# Patient Record
Sex: Male | Born: 1990 | Race: White | Hispanic: No | Marital: Single | Smoking: Current some day smoker
Health system: Southern US, Community
[De-identification: ages and names within clinical notes are randomized; demographics above are authoritative.]

## PROBLEM LIST (undated history)

## (undated) DIAGNOSIS — N200 Calculus of kidney: Secondary | ICD-10-CM

## (undated) DIAGNOSIS — Z87442 Personal history of urinary calculi: Secondary | ICD-10-CM

## (undated) HISTORY — PX: FOOT SURGERY: SHX648

---
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
## 2009-05-13 ENCOUNTER — Emergency Department (HOSPITAL_COMMUNITY): Admission: EM | Admit: 2009-05-13 | Discharge: 2009-05-13 | Payer: Self-pay | Admitting: Emergency Medicine

## 2009-05-24 ENCOUNTER — Emergency Department (HOSPITAL_COMMUNITY): Admission: EM | Admit: 2009-05-24 | Discharge: 2009-05-24 | Payer: Self-pay | Admitting: Emergency Medicine

## 2010-12-05 ENCOUNTER — Emergency Department (HOSPITAL_COMMUNITY)
Admission: EM | Admit: 2010-12-05 | Discharge: 2010-12-05 | Disposition: A | Discharge status: Home / Self Care | Payer: Self-pay | Attending: Emergency Medicine | Admitting: Emergency Medicine

## 2010-12-05 DIAGNOSIS — W268XXA Contact with other sharp object(s), not elsewhere classified, initial encounter: Secondary | ICD-10-CM | POA: Insufficient documentation

## 2010-12-05 DIAGNOSIS — S51809A Unspecified open wound of unspecified forearm, initial encounter: Secondary | ICD-10-CM | POA: Insufficient documentation

## 2011-01-30 ENCOUNTER — Emergency Department (HOSPITAL_COMMUNITY)
Admission: EM | Admit: 2011-01-30 | Discharge: 2011-01-30 | Disposition: A | Discharge status: Home / Self Care | Payer: Self-pay | Attending: Emergency Medicine | Admitting: Emergency Medicine

## 2011-01-30 DIAGNOSIS — R1013 Epigastric pain: Secondary | ICD-10-CM | POA: Insufficient documentation

## 2011-01-30 DIAGNOSIS — R112 Nausea with vomiting, unspecified: Secondary | ICD-10-CM | POA: Insufficient documentation

## 2012-02-06 ENCOUNTER — Encounter (HOSPITAL_COMMUNITY): Payer: Self-pay | Admitting: *Deleted

## 2012-02-06 ENCOUNTER — Emergency Department (HOSPITAL_COMMUNITY): Payer: Self-pay

## 2012-02-06 ENCOUNTER — Emergency Department (HOSPITAL_COMMUNITY)
Admission: EM | Admit: 2012-02-06 | Discharge: 2012-02-06 | Disposition: A | Discharge status: Home / Self Care | Payer: Self-pay | Attending: Emergency Medicine | Admitting: Emergency Medicine

## 2012-02-06 DIAGNOSIS — R079 Chest pain, unspecified: Secondary | ICD-10-CM | POA: Insufficient documentation

## 2012-02-06 DIAGNOSIS — R0789 Other chest pain: Secondary | ICD-10-CM

## 2012-02-06 DIAGNOSIS — F17200 Nicotine dependence, unspecified, uncomplicated: Secondary | ICD-10-CM | POA: Insufficient documentation

## 2012-02-06 MED ORDER — HYDROCODONE-ACETAMINOPHEN 5-325 MG PO TABS: 1.0000 | ORAL_TABLET | Freq: Four times a day (QID) | ORAL | Status: AC | PRN

## 2012-02-06 MED ORDER — KETOROLAC TROMETHAMINE 60 MG/2ML IM SOLN
60.0000 mg | Freq: Once | INTRAMUSCULAR | Status: AC
  Administered 2012-02-06: 60 mg via INTRAMUSCULAR
  Filled 2012-02-06: qty 2

## 2012-02-06 MED ORDER — CYCLOBENZAPRINE HCL 10 MG PO TABS: 10.0000 mg | ORAL_TABLET | Freq: Three times a day (TID) | ORAL | Status: AC | PRN

## 2012-02-06 MED ORDER — IBUPROFEN 800 MG PO TABS: 800.0000 mg | ORAL_TABLET | Freq: Three times a day (TID) | ORAL | Status: AC | PRN

## 2012-02-06 NOTE — ED Provider Notes (Signed)
History     CSN: [id number]  Arrival date & time 02/06/12  1400   First MD Initiated Contact with Patient 02/06/12 1627      Chief Complaint  Patient presents with  . Chest Pain    (Consider location/radiation/quality/duration/timing/severity/associated sxs/prior treatment) HPI Patient presents to the emergency department with left lateral rib pain that radiates the upper part of his chest patient, states it's worse with deep inspiration, movement and lying flat.  Patient, states, that he's had no recent fevers, nausea, vomiting, diarrhea, abdominal pain, back pain, headache, visual changes, weakness, calf pain, hemoptysis, rash or cough.  Patient, states he did not take anything prior to arrival, for his discomfort.  Patient denies any thing that seems to make the pain better.  History reviewed. No pertinent past medical history.  History reviewed. No pertinent past surgical history.  History reviewed. No pertinent family history.  History  Substance Use Topics  . Smoking status: Current Everyday Smoker    Types: Cigarettes  . Smokeless tobacco: Not on file  . Alcohol Use: No      Review of Systems All other systems negative except as documented in the HPI. All pertinent positives and negatives as reviewed in the HPI.  Allergies  Review of patient's allergies indicates no known allergies.  Home Medications  No current outpatient prescriptions on file.  BP 118/65  Pulse 68  Temp 98.4 F (36.9 C) (Oral)  Resp 16  Ht 5' 8.5" (1.74 m)  Wt 145 lb (65.772 kg)  BMI 21.73 kg/m2  SpO2 99%  Physical Exam  Nursing note and vitals reviewed. Constitutional: He appears well-developed and well-nourished. No distress.  Cardiovascular: Normal rate, regular rhythm and normal heart sounds.  Exam reveals no gallop and no friction rub.   No murmur heard. Pulmonary/Chest: Effort normal and breath sounds normal. No respiratory distress. He has no wheezes. He has no rales. He  exhibits no crepitus.      ED Course  Procedures (including critical care time)  Labs Reviewed - No data to display Dg Chest 2 View  02/06/2012  *RADIOLOGY REPORT*  Clinical Data: Smoker with chest pain and nausea.  CHEST - 2 VIEW  Comparison: None.  Findings: Cardiomediastinal silhouette unremarkable.  Lungs clear. Bronchovascular markings normal.  Pulmonary vascularity normal.  No pleural effusions.  No pneumothorax.  Visualized bony thorax intact.  IMPRESSION: Normal examination.   Original Report Authenticated By: Arnell Sieving, M.D.     The patient most likely has chest wall pain based on his HPI and PE. The patient is PERC negative and wells Criteria is low risk. The patient has palpable pain in his chest wall.   MDM   Date: 02/06/2012  Rate: 64  Rhythm: normal sinus rhythm  QRS Axis: normal  Intervals: normal  ST/T Wave abnormalities: normal  Conduction Disutrbances:none  Narrative Interpretation:   Old EKG Reviewed: none available          Carlyle Dolly, PA-C 02/06/12 1757

## 2012-02-06 NOTE — Discharge Instructions (Signed)
Return here as needed. Use ice and heat on your chest °

## 2012-02-06 NOTE — ED Notes (Signed)
Patient reports that he is having left rib cage area pain and chest pain. Patient reports that he has been having heart racing and nausea at times. Patient also states it is worse when he lays down.

## 2012-02-06 NOTE — ED Notes (Signed)
Pt back from X-ray.  

## 2012-02-06 NOTE — ED Provider Notes (Signed)
Medical screening examination/treatment/procedure(s) were performed by non-physician practitioner and as supervising physician I was immediately available for consultation/collaboration.   Celene Kras, MD 02/06/12 [phone]

## 2012-02-06 NOTE — ED Notes (Signed)
Patient transported to X-ray 

## 2012-09-21 ENCOUNTER — Encounter (HOSPITAL_COMMUNITY): Payer: Self-pay | Admitting: *Deleted

## 2012-09-21 ENCOUNTER — Emergency Department (HOSPITAL_COMMUNITY)
Admission: EM | Admit: 2012-09-21 | Discharge: 2012-09-21 | Disposition: A | Discharge status: Home / Self Care | Payer: Self-pay | Attending: Emergency Medicine | Admitting: Emergency Medicine

## 2012-09-21 ENCOUNTER — Emergency Department (HOSPITAL_COMMUNITY): Payer: Self-pay

## 2012-09-21 DIAGNOSIS — S6391XA Sprain of unspecified part of right wrist and hand, initial encounter: Secondary | ICD-10-CM

## 2012-09-21 DIAGNOSIS — F17200 Nicotine dependence, unspecified, uncomplicated: Secondary | ICD-10-CM | POA: Insufficient documentation

## 2012-09-21 DIAGNOSIS — Y9239 Other specified sports and athletic area as the place of occurrence of the external cause: Secondary | ICD-10-CM | POA: Insufficient documentation

## 2012-09-21 DIAGNOSIS — Y92838 Other recreation area as the place of occurrence of the external cause: Secondary | ICD-10-CM | POA: Insufficient documentation

## 2012-09-21 DIAGNOSIS — R296 Repeated falls: Secondary | ICD-10-CM | POA: Insufficient documentation

## 2012-09-21 DIAGNOSIS — S6390XA Sprain of unspecified part of unspecified wrist and hand, initial encounter: Secondary | ICD-10-CM | POA: Insufficient documentation

## 2012-09-21 DIAGNOSIS — Y9367 Activity, basketball: Secondary | ICD-10-CM | POA: Insufficient documentation

## 2012-09-21 MED ORDER — NAPROXEN 500 MG PO TABS: 500.0000 mg | ORAL_TABLET | Freq: Two times a day (BID) | ORAL | Status: DC

## 2012-09-21 NOTE — ED Provider Notes (Signed)
Medical screening examination/treatment/procedure(s) were performed by non-physician practitioner and as supervising physician I was immediately available for consultation/collaboration.   Gavin Pound. Oletta Lamas, MD 09/21/12 1610

## 2012-09-21 NOTE — ED Provider Notes (Signed)
History    This chart was scribed for non-physician practitioner working with Gavin Pound. Ghim, MD by ED Scribe, Burman Nieves. This patient was seen in room WTR6/WTR6 and the patient's care was started at 7:10 PM.   CSN: [id number]  Arrival date & time 09/21/12  1605   First MD Initiated Contact with Patient 09/21/12 1910      Chief Complaint  Patient presents with  . Hand Injury    (Consider location/radiation/quality/duration/timing/severity/associated sxs/prior treatment) The history is provided by the patient. No language interpreter was used.   Aaron Fry is a 22 y.o. male who presents to the Emergency Department complaining of moderate constant right hand pain onset 2 weeks ago. Pt states he was playing basketball when he injured his right hand by landing on it. Pt states he has been putting ice on the affected area and then a heating compress with no immediate relief. He states movement exacerbates pain in the palm of his right hand. Pt denies fever, chills, cough, nausea, vomiting, diarrhea, SOB, weakness, and any other associated symptoms.   History reviewed. No pertinent past medical history.  History reviewed. No pertinent past surgical history.  History reviewed. No pertinent family history.  History  Substance Use Topics  . Smoking status: Current Every Day Smoker    Types: Cigarettes  . Smokeless tobacco: Never Used  . Alcohol Use: No      Review of Systems  Musculoskeletal: Positive for myalgias and arthralgias.  All other systems reviewed and are negative.    Allergies  Review of patient's allergies indicates no known allergies.  Home Medications  No current outpatient prescriptions on file.  BP 108/55  Pulse 60  Temp(Src) 97.9 F (36.6 C) (Oral)  Resp 18  SpO2 100%  Physical Exam  Nursing note and vitals reviewed. Constitutional: He is oriented to person, place, and time. He appears well-developed and well-nourished. No distress.  HENT:   Head: Normocephalic and atraumatic.  Eyes: EOM are normal.  Neck: Neck supple. No tracheal deviation present.  Cardiovascular: Normal rate.   Pulmonary/Chest: Effort normal. No respiratory distress.  Musculoskeletal: Normal range of motion. He exhibits tenderness.  Full ROM to the wrist no pain. Tender to his 3rd and 4th metacarpals. Full ROM of all fingers pain with ROM of the 3rd and 4th finger MCP joint. Hand appears normal no swelling or bruising. Normal cap refill of all distal fingers  Neurological: He is alert and oriented to person, place, and time.  Skin: Skin is warm and dry.  Psychiatric: He has a normal mood and affect. His behavior is normal.    ED Course  Procedures (including critical care time) DIAGNOSTIC STUDIES: Oxygen Saturation is 100% on room air, normal by my interpretation.    COORDINATION OF CARE: 7:23 PM Discussed ED treatment with pt and pt agrees.      Labs Reviewed - No data to display Dg Hand Complete Right  09/21/2012  *RADIOLOGY REPORT*  Clinical Data: Pain secondary to an injury while playing basketball 2 weeks ago.  RIGHT HAND - COMPLETE 3+ VIEW  Comparison: None.  Findings: There is no fracture, dislocation, or other abnormality.  IMPRESSION: Normal exam.   Original Report Authenticated By: Francene Boyers, M.D.      1. Hand sprain, right, initial encounter       MDM  X-ray negative. No obvious swelling or bruising noted on the hand. neurovascularly intact. Full rom of all fingers. Ace wrap at home, ice, elevation, naprosyn. Follow  up with hand if not improving.  Suspect a sprain.    I personally performed the services described in this documentation, which was scribed in my presence. The recorded information has been reviewed and is accurate.         Lottie Mussel, PA-C 09/21/12 1941

## 2012-09-21 NOTE — ED Notes (Signed)
Pt states he was playing basketball x 2 weeks ago, states "I hurt my right hand", pt states he's unsure of what he did but hurts at palm of R hand, no deformity or swelling noted, pt able to move finger and hand freely.

## 2014-06-11 ENCOUNTER — Encounter (HOSPITAL_COMMUNITY): Payer: Self-pay | Admitting: Emergency Medicine

## 2014-06-11 ENCOUNTER — Emergency Department (HOSPITAL_COMMUNITY): Payer: Self-pay

## 2014-06-11 ENCOUNTER — Emergency Department (HOSPITAL_COMMUNITY)
Admission: EM | Admit: 2014-06-11 | Discharge: 2014-06-11 | Disposition: A | Discharge status: Home / Self Care | Payer: Self-pay | Attending: Emergency Medicine | Admitting: Emergency Medicine

## 2014-06-11 DIAGNOSIS — R0781 Pleurodynia: Secondary | ICD-10-CM | POA: Insufficient documentation

## 2014-06-11 DIAGNOSIS — Z720 Tobacco use: Secondary | ICD-10-CM | POA: Insufficient documentation

## 2014-06-11 DIAGNOSIS — Z791 Long term (current) use of non-steroidal anti-inflammatories (NSAID): Secondary | ICD-10-CM | POA: Insufficient documentation

## 2014-06-11 DIAGNOSIS — H81399 Other peripheral vertigo, unspecified ear: Secondary | ICD-10-CM | POA: Insufficient documentation

## 2014-06-11 LAB — I-STAT CHEM 8, ED
BUN: 11 mg/dL (ref 6–23)
CALCIUM ION: 1.16 mmol/L (ref 1.12–1.23)
CREATININE: 0.8 mg/dL (ref 0.50–1.35)
Chloride: 96 mEq/L (ref 96–112)
GLUCOSE: 105 mg/dL — AB (ref 70–99)
HCT: 49 % (ref 39.0–52.0)
HEMOGLOBIN: 16.7 g/dL (ref 13.0–17.0)
Potassium: 3.8 mmol/L (ref 3.5–5.1)
Sodium: 136 mmol/L (ref 135–145)
TCO2: 25 mmol/L (ref 0–100)

## 2014-06-11 MED ORDER — SODIUM CHLORIDE 0.9 % IV BOLUS (SEPSIS)
1000.0000 mL | Freq: Once | INTRAVENOUS | Status: AC
  Administered 2014-06-11: 1000 mL via INTRAVENOUS

## 2014-06-11 MED ORDER — MECLIZINE HCL 50 MG PO TABS: 50.0000 mg | ORAL_TABLET | Freq: Three times a day (TID) | ORAL | Status: DC | PRN

## 2014-06-11 MED ORDER — NAPROXEN 500 MG PO TABS: 500.0000 mg | ORAL_TABLET | Freq: Two times a day (BID) | ORAL | Status: DC

## 2014-06-11 NOTE — ED Notes (Signed)
Pt from home c/o right shoulder pain sharp in character and dizziness x a few days. He reports the dizziness feels as if the room is spinning. No changes in vision.

## 2014-06-11 NOTE — ED Notes (Signed)
Patient transported to X-ray 

## 2014-06-11 NOTE — ED Provider Notes (Signed)
CSN: [id number]     Arrival date & time 06/11/14  1246 History   First MD Initiated Contact with Patient 06/11/14 1534     Chief Complaint  Patient presents with  . Shoulder Pain     (Consider location/radiation/quality/duration/timing/severity/associated sxs/prior Treatment) HPI   24 year old male with no significant past medical history who presents complaining of right shoulder blade pain and dizziness. Patient states for the past 2-3 days he has had persistent pain to his right shoulder blade worsening with taking deep breath or with shoulder movement. Pain is described as a sharp sensation that is not improved despite taking ibuprofen as treatment. He denies any specific injury. He denies any numbness or weakness. Furthermore patient states that he has had episodes of dizziness which she described as a room spinning sensation worsening with positional changes especially home from a sitting to a standing position. This happened on occasion. No planes of dizziness while laying at rest. He denies having any fever, chills, hearing changes, ear pain, URI symptoms, recent alcohol or street drug use, although very dehydrated. Denies any change in vision, no blurred vision or double vision. He does not have a primary care doctor and is not allergic to any medication. He denies any prior history of PUD DVT, no recent surgery, prolonged bed rest, unilateral leg swelling or calf pain.  History reviewed. No pertinent past medical history. History reviewed. No pertinent past surgical history. No family history on file. History  Substance Use Topics  . Smoking status: Current Every Day Smoker    Types: Cigarettes  . Smokeless tobacco: Never Used  . Alcohol Use: Yes     Comment: rarely    Review of Systems  All other systems reviewed and are negative.     Allergies  Review of patient's allergies indicates no known allergies.  Home Medications   Prior to Admission medications   Medication  Sig Start Date End Date Taking? Authorizing Provider  naproxen (NAPROSYN) 500 MG tablet Take 1 tablet (500 mg total) by mouth 2 (two) times daily. Patient not taking: Reported on 06/11/2014 09/21/12   Tatyana A Kirichenko, PA-C   BP 108/64 mmHg  Pulse 94  Temp(Src) 97.8 F (36.6 C) (Oral)  Resp 18  SpO2 100% Physical Exam  Constitutional: He is oriented to person, place, and time. He appears well-developed and well-nourished. No distress.  HENT:  Head: Atraumatic.  Right Ear: External ear normal.  Left Ear: External ear normal.  Eyes: Conjunctivae and EOM are normal. Pupils are equal, round, and reactive to light.  Neck: Normal range of motion. Neck supple.  No nuchal rigidity  Cardiovascular: Normal rate and regular rhythm.   Pulmonary/Chest: Effort normal and breath sounds normal. He exhibits tenderness (Tenderness along the inferior right scapular lesion on palpation without any overlying skin changes. Full range of motion throughout right shoulder.).  Abdominal: Bowel sounds are normal. He exhibits no distension. There is no tenderness.  No abdominal mass or bruit noted  Musculoskeletal:  Radial pulses palpable bilaterally, normal grip strength.  Neurological: He is alert and oriented to person, place, and time.  Skin: No rash noted.  Psychiatric: He has a normal mood and affect.    ED Course  Procedures (including critical care time)  Patient presents complaining of pain to his right shoulder blade without any significant trauma. He also complaining of some pleuritic component to it. He is PERC negative, therefore low suspicion for PE. He also complaining of sensation of dizziness upon standing, likely  peripheral vertigo and less likely to be central. No complaint of dizziness at this time. No abdominal pain, or bruits to suggest AAA. He is afebrile, vital signs stable and no hypoxia. Will check H&H, and will also obtain a chest x-ray for further evaluation. I have low suspicion  for cardiopulmonary etiology.  4:55 PM Patient has normal orthostatic vital sign, a chest x-ray shows no evidence of lung infection or any bony pathology, and he has normal H&H without any electrolytes abnormalities. EKG shows no evidence of arrhythmia.  Patient currently without any ongoing symptoms. Plan to discharge patient with meclizine for his peripheral vertigo, recommend ibuprofen for pain, and patient may return if his condition worsen. Patient voiced understanding and agrees with plan. Provide resources for outpatient follow-up as well.  Labs Review Labs Reviewed  I-STAT CHEM 8, ED - Abnormal; Notable for the following:    Glucose, Bld 105 (*)    All other components within normal limits    Imaging Review Dg Chest 2 View  06/11/2014   CLINICAL DATA:  Sharp right-sided chest pain for 3-4 days.  EXAM: CHEST  2 VIEW  COMPARISON:  02/06/2012  FINDINGS: The heart size and mediastinal contours are within normal limits. Both lungs are clear. The visualized skeletal structures are unremarkable.  IMPRESSION: Normal chest.   Electronically Signed   By: Geanie Cooley M.D.   On: 06/11/2014 16:34     EKG Interpretation None      Date: 06/11/2014  Rate: 81  Rhythm: normal sinus rhythm  QRS Axis: normal  Intervals: normal  ST/T Wave abnormalities: normal  Conduction Disutrbances: none  Narrative Interpretation:   Old EKG Reviewed: none for comparison  MDM   Final diagnoses:  Pleuritic chest pain  Vertigo, peripheral, unspecified laterality    BP 119/50 mmHg  Pulse 81  Temp(Src) 97.8 F (36.6 C) (Oral)  Resp 20  SpO2 98%  I have reviewed nursing notes and vital signs. I personally reviewed the imaging tests through PACS system  I reviewed available ER/hospitalization records thought the EMR     Fayrene Helper, PA-C 06/11/14 1710  Arby Barrette, MD 06/12/14 [phone]

## 2014-06-11 NOTE — Discharge Instructions (Signed)
Benign Positional Vertigo Vertigo means you feel like you or your surroundings are moving when they are not. Benign positional vertigo is the most common form of vertigo. Benign means that the cause of your condition is not serious. Benign positional vertigo is more common in older adults. CAUSES  Benign positional vertigo is the result of an upset in the labyrinth system. This is an area in the middle ear that helps control your balance. This may be caused by a viral infection, head injury, or repetitive motion. However, often no specific cause is found. SYMPTOMS  Symptoms of benign positional vertigo occur when you move your head or eyes in different directions. Some of the symptoms may include:  Loss of balance and falls.  Vomiting.  Blurred vision.  Dizziness.  Nausea.  Involuntary eye movements (nystagmus). DIAGNOSIS  Benign positional vertigo is usually diagnosed by physical exam. If the specific cause of your benign positional vertigo is unknown, your caregiver may perform imaging tests, such as magnetic resonance imaging (MRI) or computed tomography (CT). TREATMENT  Your caregiver may recommend movements or procedures to correct the benign positional vertigo. Medicines such as meclizine, benzodiazepines, and medicines for nausea may be used to treat your symptoms. In rare cases, if your symptoms are caused by certain conditions that affect the inner ear, you may need surgery. HOME CARE INSTRUCTIONS   Follow your caregiver's instructions.  Move slowly. Do not make sudden body or head movements.  Avoid driving.  Avoid operating heavy machinery.  Avoid performing any tasks that would be dangerous to you or others during a vertigo episode.  Drink enough fluids to keep your urine clear or pale yellow. SEEK IMMEDIATE MEDICAL CARE IF:   You develop problems with walking, weakness, numbness, or using your arms, hands, or legs.  You have difficulty speaking.  You develop  severe headaches.  Your nausea or vomiting continues or gets worse.  You develop visual changes.  Your family or friends notice any behavioral changes.  Your condition gets worse.  You have a fever.  You develop a stiff neck or sensitivity to light. MAKE SURE YOU:   Understand these instructions.  Will watch your condition.  Will get help right away if you are not doing well or get worse. Document Released: 03/04/2006 Document Revised: 08/19/2011 Document Reviewed: 02/14/2011 [facility] Patient Information 2015 [place], [place]. This information is not intended to replace advice given to you by your health care provider. Make sure you discuss any questions you have with your health care provider.   Emergency Department Resource Guide 1) Find a Doctor and Pay Out of Pocket Although you won't have to find out who is covered by your insurance plan, it is a good idea to ask around and get recommendations. You will then need to call the office and see if the doctor you have chosen will accept you as a new patient and what types of options they offer for patients who are self-pay. Some doctors offer discounts or will set up payment plans for their patients who do not have insurance, but you will need to ask so you aren't surprised when you get to your appointment.  2) Contact Your Local Health Department Not all health departments have doctors that can see patients for sick visits, but many do, so it is worth a call to see if yours does. If you don't know where your local health department is, you can check in your phone book. The CDC also has a tool to  help you locate your state's health department, and many state websites also have listings of all of their local health departments.  3) Find a [facility] If your illness is not likely to be very severe or complicated, you may want to try a walk in clinic. These are popping up all over the country in pharmacies, drugstores, and shopping  centers. They're usually staffed by nurse practitioners or physician assistants that have been trained to treat common illnesses and complaints. They're usually fairly quick and inexpensive. However, if you have serious medical issues or chronic medical problems, these are probably not your best option.  No Primary Care Doctor: - Call Health Connect at  [phone] - they can help you locate a primary care doctor that  accepts your insurance, provides certain services, etc. - Physician Referral Service- [phone]  Chronic Pain Problems: Organization         Address  Phone   Notes  [facility]  [phone] Patients need to be referred by their primary care doctor.   Medication Assistance: Organization         Address  Phone   Notes  [facility] Medication [facility] [address] [place], [place] [address] [phone] --Must be a resident of [facility] -- Must have NO insurance coverage whatsoever (no Medicaid/ Medicare, etc.) -- The pt. MUST have a primary care doctor that directs their care regularly and follows them in the community   MedAssist  [phone]   [employer]  [phone]    Agencies that provide inexpensive medical care: Organization         Address  Phone   Notes  [place]  [phone]   [facility]    [phone]   [facility] [place], [place] [address] [phone]   [place] [address], [place] [phone]   Planned Parenthood    [phone]   [facility]    [phone]   [place] and [place] [address], [place] Phone:  [phone], Fax:  [phone] Hours of Operation:  9 am - 6 pm, M-F.  Also accepts Medicaid/Medicare and self-pay.  [facility] for [employer], [address], [place] Phone: [phone], Fax: [phone]. Hours of Operation:  8:30 am - 5:30 pm, M-F.  Also accepts Medicaid and self-pay.  [facility] [place] [address] [place] Phone: [phone]   [place], [place], [place] [phone], Ext. [phone] Mondays & Thursdays: 7-9 AM.  First 15 patients are seen on a first come, first serve basis.    [place] Providers:  Organization         Address  Phone   Notes  [facility] [address], [place] [phone] Also accepts self-pay patients.  [facility] [address], [place]  [phone]   [place], [address], [place] [phone]   [facility] [address] [place] [phone]   Lucianne Lei [address], [place]   [phone] Only accepts [place] Access [place] patients after they have their name applied to their card.   Self-Pay (no insurance) in [facility]:  Organization  Address  Phone   Notes  Sickle Cell Patients, [facility] [address] [place], [place] [phone]   [facility] [address] [place], [place] [phone]   [facility] [place]  [address], [place] [phone]   [facility]/Dr. Osei-Bonsu  [address] [place] or [address], [place] [phone] Phone number for both [place] and [place] locations is the same.  Urgent Medical and [facility] [address] [place] [phone]   [facility] [address] [place] or [address] [phone]   [facility] [address] [place] [phone], phone; [phone], fax Sees patients 1st and 3rd Saturday of every month.  Must not qualify for public or private insurance (i.e.  Medicaid, Medicare, [place] Health Choice, Veterans' Benefits)  Household income should be no more than 200% of the poverty level The clinic cannot treat you if you are pregnant or think you are pregnant  Sexually transmitted diseases are not treated at the clinic.    Dental Care: Organization         Address  Phone  Notes  [facility] Department of [facility] [address] [place], [place] [phone] Accepts children up to age 65 who are enrolled in [place] or [place] Health Choice; pregnant women with a Medicaid card; and children who have applied for Medicaid or [place] Health Choice, but were declined, whose parents can pay a reduced fee at time of service.  [facility] Department of [facility]  [address], [place] [phone] Accepts children up to age 62 who are enrolled in [place] or [place] Health Choice; pregnant women with a Medicaid card; and children who have applied for Medicaid or [place] Health Choice, but were declined, whose parents can pay a reduced fee at time of service.  [facility]  [address] [place], [place] [phone] Patients are seen by appointment only. Walk-ins are not accepted. [facility] will see patients 70 years of age and older. Monday - Tuesday (8am-5pm) Most Wednesdays (8:30-5pm) $30 per visit, cash only  [facility] Adult [facility]  [address], [facility] [phone] Patients are seen by appointment only. Walk-ins are not accepted. [facility] will see patients 72 years of age and older. One Wednesday Evening (Monthly: Volunteer Based).  $30 per visit, cash only  [employer]  [phone] for adults; Children under age 4, call Graduate Pediatric Dentistry at [phone]. Children aged 28-14, please call [phone] to request a pediatric application.  Dental services are provided in all areas of dental care including fillings,  crowns and bridges, complete and partial dentures, implants, gum treatment, root canals, and extractions. Preventive care is also provided. Treatment is provided to both adults and children. Patients are selected via a lottery and there is often a waiting list.   [facility] [address] [place]  [phone] [email or web address]   [facility] [address] [place], [place] [phone], Ext. [phone] Second and Fourth Thursday of each month, opens at 6:30 AM; Clinic ends at 9 AM.  Patients are seen on a first-come first-served basis, and a limited number are seen during each clinic.   [facility]  [address] [place], [place]  [place], [place] [phone]   Eligibility Requirements You must have lived in [place], [place], or [place] counties for at least the last three months.   You cannot be eligible for state or federal sponsored [employer], including [employer], [place], or [employer].   You generally cannot be eligible for healthcare insurance through your employer.    How to apply: Eligibility screenings are held every Tuesday and Wednesday afternoon from 1:00 pm until 4:00 pm. You do not need an appointment for the interview!  [facility] [address], [place], [place]   [place]  [place] Department  [place]  [phone]    Behavioral Health Resources in the Community: Intensive Outpatient Programs Organization         Address  Phone  Notes  [facility]. [address] [place], [place] [phone]   [facility] Outpatient [address] [place], [place]   ADS: [facility] [address] [place], [place]   [employer] [address],  [place], [place] or [phone]   Substance Abuse  Resources Organization         Address  Phone  Notes  [facility] Drug Services  [phone]   [place]  [phone]   [facility] [place]   Chinita Pester  [phone]   Residential & Outpatient Substance Abuse Program  [phone]   Psychological Services Organization         Address  Phone  Notes  [facility] [place]  [place]  [phone]   [place] [address], [place] or [phone]    Mobile Crisis Teams Organization         Address  Phone  Notes  [facility], Mobile Crisis Care Unit  [phone]   [facility]  [address] [place], [place]   [place] [address], [place] [phone]    Self-Help/Support Groups Organization         Address  Phone             Notes  [place]. of [place] - variety of support groups  [place] Call for more information  Narcotics Anonymous (NA), [facility] [address], [employer] [place]  2 meetings at this location   Special educational needs teacher         Address  Phone  Notes  [facility] [place],    [place]  [phone]   [facility]  [address] [place] [address], [place], [place]   [place], [place] [phone] Admissions: 8am-3pm M-F  Incentives Substance [place] [address]    [place], [place] [phone]   [facility] [address] Jadene Pierini [place], [place]   [facility] [address],  [place], [place]   [facility] - Intensive Outpatient [address] Kristeen [address], [place], [place]   [facility] ([place].) [place].,  [place], [place] or [phone]   [facility] ([facility]) [address], [place], [place] Accepts Medicaid  [facility] [place] [address]  [place] [phone] Substance Abuse/Addiction Treatment   [facility] Resources Organization  Address  Phone  Notes  °[facility]  ([phone]) [phone]   °Julie Brannon, PhD [address] [place], [place]   ([phone] or ([phone]   °[facility]   [address] °[place], [place] [phone]   °[facility] [address], [place], [place] [phone] Insurance/Medicaid/sponsorship through [facility]  °[facility] [address]                                    [place], [place] [phone] Therapy/tele-psych/case  °[facility] [address]  ° [place], [place] [phone]    °Dr. Arfeen  ([phone]   °[facility] [place]  [employer] [place] [facility] 1) [address], [place] °2) [address], [place] °3)  [address], [place] [phone]) [phone] °([phone]) [phone] ° °([phone]) [phone]   °[place] Child Abuse Hotline (336) 342-1394 or (336) [phone] (After Hours)    ° ° ° °

## 2014-10-08 ENCOUNTER — Emergency Department (HOSPITAL_COMMUNITY)
Admission: EM | Admit: 2014-10-08 | Discharge: 2014-10-08 | Disposition: A | Discharge status: Home / Self Care | Payer: Self-pay | Attending: Emergency Medicine | Admitting: Emergency Medicine

## 2014-10-08 ENCOUNTER — Encounter (HOSPITAL_COMMUNITY): Payer: Self-pay | Admitting: Emergency Medicine

## 2014-10-08 DIAGNOSIS — Z791 Long term (current) use of non-steroidal anti-inflammatories (NSAID): Secondary | ICD-10-CM | POA: Insufficient documentation

## 2014-10-08 DIAGNOSIS — Z720 Tobacco use: Secondary | ICD-10-CM | POA: Insufficient documentation

## 2014-10-08 DIAGNOSIS — M545 Low back pain, unspecified: Secondary | ICD-10-CM

## 2014-10-08 MED ORDER — METHOCARBAMOL 500 MG PO TABS: 500.0000 mg | ORAL_TABLET | Freq: Two times a day (BID) | ORAL | Status: DC

## 2014-10-08 MED ORDER — NAPROXEN 500 MG PO TABS: 500.0000 mg | ORAL_TABLET | Freq: Two times a day (BID) | ORAL | Status: DC

## 2014-10-08 NOTE — Discharge Instructions (Signed)
Take naproxen and muscle relaxant as needed for your pain.  Follow up with a primary care provider for further care   Emergency Department Resource Guide 1) Find a Doctor and Pay Out of Pocket Although you won't have to find out who is covered by your insurance plan, it is a good idea to ask around and get recommendations. You will then need to call the office and see if the doctor you have chosen will accept you as a new patient and what types of options they offer for patients who are self-pay. Some doctors offer discounts or will set up payment plans for their patients who do not have insurance, but you will need to ask so you aren't surprised when you get to your appointment.  2) Contact Your Local Health Department Not all health departments have doctors that can see patients for sick visits, but many do, so it is worth a call to see if yours does. If you don't know where your local health department is, you can check in your phone book. The CDC also has a tool to help you locate your state's health department, and many state websites also have listings of all of their local health departments.  3) Find a Walk-in Clinic If your illness is not likely to be very severe or complicated, you may want to try a walk in clinic. These are popping up all over the country in pharmacies, drugstores, and shopping centers. They're usually staffed by nurse practitioners or physician assistants that have been trained to treat common illnesses and complaints. They're usually fairly quick and inexpensive. However, if you have serious medical issues or chronic medical problems, these are probably not your best option.  No Primary Care Doctor: - Call Health Connect at  [phone] - they can help you locate a primary care doctor that  accepts your insurance, provides certain services, etc. - Physician Referral Service- [phone]  Chronic Pain Problems: Organization         Address  Phone   Notes  [facility]  [phone] Patients need to be referred by their primary care doctor.   Medication Assistance: Organization         Address  Phone   Notes  [facility] [address] [place], [place] [address] [phone]) [phone] --Must be a resident of [facility] -- Must have NO insurance coverage whatsoever (no Medicaid/ Medicare, etc.) -- The pt. MUST have a primary care doctor that directs their care regularly and follows them in the community   MedAssist  [phone]   [employer]  [phone]    Agencies that provide inexpensive medical care: Organization         Address  Phone   Notes  [facility]  [phone]   [facility]    [phone]   [facility] [address] [place], [place] [address] [phone]   [facility] [address], [place] [phone]   Planned Parenthood    [phone](336) [phone]   [facility]    [phone]   [facility]  [address], [place] Phone:  [phone], Fax:  [phone]) [phone] Hours of Operation:  9 am - 6 pm, M-F.  Also accepts Medicaid/Medicare and self-pay.  [facility]  [address], [place] Phone: [phone](336) [phone], Fax: [phone]. Hours of Operation:  8:30 am - 5:30 pm, M-F.  Also accepts Medicaid and self-pay.  [facility] [place] [address], [place] Phone: [phone]   [facility] [address] Natasha Bence [place], [place] [phone], Ext. [phone] Mondays & Thursdays: 7-9 AM.  First 15 patients are seen on a first come, first serve basis.    Medicaid-accepting [facility] Providers:  Organization         Address  Phone   Notes  [facility] [address], [place] [phone] Also accepts self-pay patients.  [facility] [address]  Laurell Josephs [place], [place]  [phone]   [facility] [address], [place] [phone]   [facility] [address], [place] [phone]   Renaye Rakers [address], [place]   [phone] Only accepts [place] Access [place] patients after they have their name applied to their card.   Self-Pay (no insurance) in [facility]:  Organization         Address  Phone   Notes  Sickle Cell Patients, [facility] [address] [place], [place] [phone]   [facility] [address] [place], [place] [phone]   [facility] [place]  [address], [place] [phone]   [facility]/Dr. Osei-Bonsu  [address] [place] or [address], [place] [phone] Phone number for both [place] and [place] locations is the same.  [facility] and [facility] [address] [place] [phone]   [facility] [address], [place] or [address] [phone]   [facility] [address], [place] [phone], phone; [phone], fax Sees patients 1st and 3rd Saturday of every month.  Must not qualify for public or private insurance (i.e. Medicaid, Medicare, [place] Health Choice, Veterans' Benefits)  Household income should be no more than 200% of the poverty level The clinic cannot treat you if you are pregnant or think you are pregnant  Sexually transmitted diseases are not treated at the clinic.    Dental Care: Organization         Address  Phone  Notes  [facility] [address] [place], [place] [phone] Accepts children up to age 2 who are enrolled in [place] or [place] Health Choice; pregnant women with a Medicaid card; and children who have applied for Medicaid  or [place] Health Choice, but were declined, whose parents can pay a reduced fee at time of service.  [facility] of [facility]  [address], [place] [phone] Accepts children up to age 65 who are enrolled in [place] or [place] Health Choice; pregnant women with a Medicaid card; and children who have applied for Medicaid or [place] Health Choice, but were declined, whose parents can pay a reduced fee at time of service.  [facility]  [address] [place], [place] [phone] Patients are seen by appointment only. Walk-ins are not accepted. [facility] will see patients 44 years of age and older. Monday - Tuesday (8am-5pm) Most Wednesdays (8:30-5pm) $30 per visit, cash only  [facility] [employer] [facility]  [address], [employer] [phone])  [phone] Patients are seen by appointment only. Walk-ins are not accepted. [place] will see patients 22 years of age and older. One Wednesday Evening (Monthly: Volunteer Based).  $30 per visit, cash only  [place]  [phone] for adults; Children under age 98, call Graduate Pediatric Dentistry at [phone]. Children aged 22-14, please call [phone] to request a pediatric application.  Dental services are provided in all areas of dental care including fillings, crowns and bridges, complete and partial dentures, implants, gum treatment, root canals, and extractions. Preventive care is also provided. Treatment is provided to both adults and children. Patients are selected via a lottery and there is often a waiting list.   [facility] [address] [place]  [phone] [email or web address]   [facility] [address] [place], [place] [phone], Ext. [phone] Second and Fourth Thursday of each month, opens at 6:30 AM; Clinic ends at 9 AM.  Patients are seen on a first-come first-served basis, and a limited number are seen  during each clinic.   [facility]  [address] Hillard Danker [place], [place] [phone]   Eligibility Requirements You must have lived in [place], [place], or [place] counties for at least the last three months.   You cannot be eligible for state or federal sponsored [employer], including [employer], [place], or [employer].   You generally cannot be eligible for healthcare insurance through your employer.    How to apply: Eligibility screenings are held every Tuesday and Wednesday afternoon from 1:00 pm until 4:00 pm. You do not need an appointment for the interview!  [facility] [address], [place], [place]   [place]  [place] Department  [place]  [phone]    Behavioral Health Resources in the Community: Intensive Outpatient Programs Organization         Address  Phone  Notes  [place] [address], [place], [place] [phone]   [facility]patient [address] [place], [place]   ADS: Alcohol & Drug Svcs [address], [place], [place]   [place] [address],  [place], [place] or [phone]   Substance Abuse Resources Organization         Address  Phone  Notes  Alcohol and Drug Services  [phone]   [place]  [phone]   The [place]   Chinita Pester  [phone]   Residential & Outpatient Substance Abuse Program  [phone]   Psychological Services Organization         Address  Phone  Notes  [facility] [place]  [place]  [phone]   [place] [address], [place] or [phone]    Mobile Crisis Teams Organization         Address  Phone  Notes  [facility],  Mobile Crisis Care Unit  [phone]   [facility]  [address] [place], [place]   [place] Levels [address], [place] [phone]    Self-Help/Support Groups Organization         Address  Phone             Notes  [place]. of [place] - variety of support groups  [phone] Call for more information  Narcotics Anonymous (NA), Caring Services [address], [employer] [place]  2 meetings at this location   Residential Facilities manager         Address  Phone  Notes  [facility] [place],    [place]  [phone]   [facility]  [address] [place] 127517, [place], [place]   [place], [place] [phone] Admissions: 8am-3pm M-F  Incentives Substance [place] [address]    [place], [place] [phone]   [facility] [address] [place], [place], [place]   [facility] [address],  [place], [place]   [facility]ight Programs - Intensive Outpatient [address], [place], [place]   [facility] ([place] [address]  [place], [place] [phone] or [phone]   [facility] Services (RTS) [address], [place], [place] Accepts Medicaid  [facility] [place] [address]  [place] [phone] Substance Abuse/Addiction Treatment   [facility] Organization         Address  Phone  Notes  [facility]  [phone]   Domenic Schwab, PhD [address] Arlis Porta [place], [place]   [phone] or [phone]   [facility] [place], [place] [phone]   [facility] [address], [place], [place] [phone] Insurance/Medicaid/sponsorship through [facility] and Families [address],  [address] [place]                                    [place], [place] [phone] [place] [address], [place] [phone]    Dr. Adele Schilder  [phone]   [facility]. 1) [address], [place] 2) [place] 3)  [place] [address], [place] [phone]  [phone]   [place] [phone] or [phone] (After Hours)

## 2014-10-08 NOTE — ED Notes (Addendum)
Verbalized understanding discharge instructions. In no acute distress.  Pt provided ice water.

## 2014-10-08 NOTE — ED Provider Notes (Signed)
CSN: [id number]     Arrival date & time 10/08/14  1227 History  This chart was scribed for non-physician practitioner, Fayrene Helper, PA-C,working with Eber Hong, MD, by Karle Plumber, ED Scribe. This patient was seen in room WTR7/WTR7 and the patient's care was started at 12:59 PM.  Chief Complaint  Patient presents with  . pain all over    The history is provided by the patient and medical records. No language interpreter was used.    HPI Comments:  Aaron Fry is a 24 y.o. male who presents to the Emergency Department complaining of worsening severe pain throughout his lower back and bilateral lower extremities down to his knees that began about three days ago. He rates the pain as 10/10 and describes it as aching. Pt states the pain is worse at night and keeps him up at night. He reports waking up in "puddles of sweat". He has been taking Ibuprofen intermittently with no significant relief of his pain. He denies modifying factors. Denies CP, SOB, rhinorrhea, sneezing, coughing, hematuria, dysuria, fever, chills, nausea, vomiting, diarrhea or abdominal pain. Pt is ambulatory without issue. He denies any trauma, injury or fall. He does not currently have a PCP.  History reviewed. No pertinent past medical history. History reviewed. No pertinent past surgical history. No family history on file. History  Substance Use Topics  . Smoking status: Current Every Day Smoker    Types: Cigarettes  . Smokeless tobacco: Never Used  . Alcohol Use: Yes     Comment: rarely    Review of Systems  Constitutional: Negative for fever and chills.  HENT: Negative for rhinorrhea and sneezing.   Respiratory: Negative for cough and shortness of breath.   Cardiovascular: Negative for chest pain.  Gastrointestinal: Negative for nausea, vomiting, abdominal pain and diarrhea.  Genitourinary: Negative for dysuria and hematuria.  Musculoskeletal: Positive for myalgias.  Skin: Negative for color change and  wound.  Neurological: Negative for weakness and numbness.    Allergies  Review of patient's allergies indicates no known allergies.  Home Medications   Prior to Admission medications   Medication Sig Start Date End Date Taking? Authorizing Provider  meclizine (ANTIVERT) 50 MG tablet Take 1 tablet (50 mg total) by mouth 3 (three) times daily as needed. 06/11/14   Fayrene Helper, PA-C  naproxen (NAPROSYN) 500 MG tablet Take 1 tablet (500 mg total) by mouth 2 (two) times daily. 06/11/14   Fayrene Helper, PA-C   Triage Vitals: BP 121/60 mmHg  Pulse 80  Temp(Src) 99.8 F (37.7 C) (Oral)  Resp 18  SpO2 100% Physical Exam  Constitutional: He is oriented to person, place, and time. He appears well-developed and well-nourished.  Non-toxic appearance.  HENT:  Head: Normocephalic and atraumatic.  Eyes: EOM are normal.  Neck: Normal range of motion.  Cardiovascular: Normal rate.   Pulmonary/Chest: Effort normal.  Abdominal: Soft. There is no tenderness.  Musculoskeletal: Normal range of motion.  Tenderness noted to midline lumbar spine. No crepitus or step off.  Neurological: He is alert and oriented to person, place, and time.  Patellar reflexes intact. Normal gait.  Skin: Skin is warm and dry.  No overlying skin changes.  Psychiatric: He has a normal mood and affect. His behavior is normal.  Nursing note and vitals reviewed.   ED Course  Procedures (including critical care time) DIAGNOSTIC STUDIES: Oxygen Saturation is 100% on RA, normal by my interpretation.   COORDINATION OF CARE: 1:05 PM- Advised pt to continue taking OTC  Ibuprofen and will provide resources for pt to follow up with a PCP. Pt verbalizes understanding and agrees to plan.  Medications - No data to display  Labs Review Labs Reviewed - No data to display  Imaging Review No results found.   EKG Interpretation None      MDM   Final diagnoses:  Midline low back pain without sciatica    BP 121/60 mmHg  Pulse  80  Temp(Src) 99.8 F (37.7 C) (Oral)  Resp 18  SpO2 100%   I personally performed the services described in this documentation, which was scribed in my presence. The recorded information has been reviewed and is accurate.    Fayrene HelperBowie Diallo Ponder, PA-C 10/08/14 1517  Eber HongBrian Miller, MD 10/08/14 [phone]

## 2014-10-08 NOTE — ED Notes (Signed)
Per pt, states pain all over for 3 days-states he can't eat and wakes him out of his sleep

## 2016-07-08 ENCOUNTER — Encounter (HOSPITAL_COMMUNITY): Payer: Self-pay

## 2016-07-08 DIAGNOSIS — H6691 Otitis media, unspecified, right ear: Secondary | ICD-10-CM | POA: Insufficient documentation

## 2016-07-08 DIAGNOSIS — F17210 Nicotine dependence, cigarettes, uncomplicated: Secondary | ICD-10-CM | POA: Insufficient documentation

## 2016-07-08 NOTE — ED Triage Notes (Signed)
Pt complaining of R ear ache. Pt complaining of muffled hearing on R side. Pt denies any sore throat or nausea/vomiting. Pt denies any fevers.

## 2016-07-09 ENCOUNTER — Emergency Department (HOSPITAL_COMMUNITY)
Admission: EM | Admit: 2016-07-09 | Discharge: 2016-07-09 | Disposition: A | Discharge status: Home / Self Care | Payer: Self-pay | Attending: Emergency Medicine | Admitting: Emergency Medicine

## 2016-07-09 DIAGNOSIS — H6691 Otitis media, unspecified, right ear: Secondary | ICD-10-CM

## 2016-07-09 MED ORDER — AMOXICILLIN-POT CLAVULANATE 875-125 MG PO TABS: 1.0000 | ORAL_TABLET | Freq: Two times a day (BID) | ORAL | 0 refills | Status: DC

## 2016-07-09 MED ORDER — AMOXICILLIN-POT CLAVULANATE 875-125 MG PO TABS
1.0000 | ORAL_TABLET | Freq: Once | ORAL | Status: AC
  Administered 2016-07-09: 1 via ORAL
  Filled 2016-07-09: qty 1

## 2016-07-09 MED ORDER — ACETAMINOPHEN 500 MG PO TABS
1000.0000 mg | ORAL_TABLET | Freq: Once | ORAL | Status: AC
  Administered 2016-07-09: 1000 mg via ORAL
  Filled 2016-07-09: qty 2

## 2016-07-09 NOTE — ED Provider Notes (Signed)
[facility] Provider Note   CSN: [id number] Arrival date & time: 07/08/16  2015    History   Chief Complaint Chief Complaint  Patient presents with  . Otalgia    HPI Aaron Fry is a 26 y.o. male.  Patient presenting for otalgia, onset today. He reports muffled hearing in his right ear and tinnitus. No relief with ibuprofen taken PTA. No hx of frequent ear infections. He denies fevers, congestion, rhinorrhea, sore throat, N/V.   The history is provided by the patient. No language interpreter was used.  Otalgia  This is a new problem. Episode onset: Today. There is pain in the right ear. The problem occurs constantly. The problem has been gradually worsening. There has been no fever. The pain is moderate. Associated symptoms include hearing loss ("muffled") and neck pain. Pertinent negatives include no ear discharge, no sore throat, no vomiting and no rash. His past medical history does not include chronic ear infection.    History reviewed. No pertinent past medical history.  There are no active problems to display for this patient.   History reviewed. No pertinent surgical history.    Home Medications    Prior to Admission medications   Medication Sig Start Date End Date Taking? Authorizing Provider  amoxicillin-clavulanate (AUGMENTIN) 875-125 MG tablet Take 1 tablet by mouth every 12 (twelve) hours. 07/09/16   Antony Madura, PA-C  meclizine (ANTIVERT) 50 MG tablet Take 1 tablet (50 mg total) by mouth 3 (three) times daily as needed. 06/11/14   Fayrene Helper, PA-C  methocarbamol (ROBAXIN) 500 MG tablet Take 1 tablet (500 mg total) by mouth 2 (two) times daily. 10/08/14   Fayrene Helper, PA-C  naproxen (NAPROSYN) 500 MG tablet Take 1 tablet (500 mg total) by mouth 2 (two) times daily. 10/08/14   Fayrene Helper, PA-C    Family History History reviewed. No pertinent family history.  Social History Social History  Substance Use Topics  . Smoking status: Current Every Day  Smoker    Types: Cigarettes  . Smokeless tobacco: Never Used  . Alcohol use Yes     Comment: rarely     Allergies   Patient has no known allergies.   Review of Systems Review of Systems  HENT: Positive for ear pain and hearing loss ("muffled"). Negative for ear discharge and sore throat.   Gastrointestinal: Negative for vomiting.  Musculoskeletal: Positive for neck pain.  Skin: Negative for rash.  Ten systems reviewed and are negative for acute change, except as noted in the HPI.    Physical Exam Updated Vital Signs BP 121/70   Pulse 80   Temp 98.2 F (36.8 C) (Oral)   Resp 16   SpO2 98%   Physical Exam  Constitutional: He is oriented to person, place, and time. He appears well-developed and well-nourished. No distress.  Nontoxic and in NAD  HENT:  Head: Normocephalic and atraumatic.  No right mastoid swelling, erythema, or tenderness. Mild tenderness when pulling on the right auricle. No edema to the right ear canal. The right tympanic membrane is erythematous. There is a middle ear effusion. Cone of light obscured. Mild bulging. No drainage noted in the right ear. Left external ear, canal, and TM are unremarkable.  Eyes: Conjunctivae and EOM are normal. No scleral icterus.  Neck: Normal range of motion.  No nuchal rigidity or meningismus.  Pulmonary/Chest: Effort normal. No respiratory distress.  Respirations even and unlabored  Musculoskeletal: Normal range of motion.  Neurological: He is alert and oriented to  person, place, and time. He exhibits normal muscle tone. Coordination normal.  Skin: Skin is warm and dry. No rash noted. He is not diaphoretic. No erythema. No pallor.  Psychiatric: He has a normal mood and affect. His behavior is normal.  Nursing note and vitals reviewed.    ED Treatments / Results  Labs (all labs ordered are listed, but only abnormal results are displayed) Labs Reviewed - No data to display  EKG  EKG Interpretation None        Radiology No results found.  Procedures Procedures (including critical care time)  Medications Ordered in ED Medications  amoxicillin-clavulanate (AUGMENTIN) 875-125 MG per tablet 1 tablet (1 tablet Oral Given 07/09/16 0037)  acetaminophen (TYLENOL) tablet 1,000 mg (1,000 mg Oral Given 07/09/16 0037)     Initial Impression / Assessment and Plan / ED Course  I have reviewed the triage vital signs and the nursing notes.  Pertinent labs & imaging results that were available during my care of the patient were reviewed by me and considered in my medical decision making (see chart for details).     Patient presents with otalgia, decreased hearing, and tinnitus. Exam consistent with acute otitis media. No concern for acute mastoiditis, meningitis. No antibiotic use in the last month.  Patient discharged home with Augmentin. Supportive care discussed and return precautions given. Patient discharged in satisfactory condition with no unaddressed concerns.   Final Clinical Impressions(s) / ED Diagnoses   Final diagnoses:  Acute otitis media, right    New Prescriptions New Prescriptions   AMOXICILLIN-CLAVULANATE (AUGMENTIN) 875-125 MG TABLET    Take 1 tablet by mouth every 12 (twelve) hours.     Antony MaduraKelly Sylvestre Rathgeber, PA-C 07/09/16 [id number]    Alvira MondayErin Schlossman, MD 07/10/16 [phone]

## 2016-11-24 ENCOUNTER — Emergency Department (HOSPITAL_COMMUNITY): Payer: Self-pay

## 2016-11-24 ENCOUNTER — Encounter (HOSPITAL_COMMUNITY): Payer: Self-pay

## 2016-11-24 ENCOUNTER — Emergency Department (HOSPITAL_COMMUNITY)
Admission: EM | Admit: 2016-11-24 | Discharge: 2016-11-25 | Disposition: A | Discharge status: Home / Self Care | Payer: Self-pay | Attending: Emergency Medicine | Admitting: Emergency Medicine

## 2016-11-24 DIAGNOSIS — F17210 Nicotine dependence, cigarettes, uncomplicated: Secondary | ICD-10-CM | POA: Insufficient documentation

## 2016-11-24 DIAGNOSIS — X509XXA Other and unspecified overexertion or strenuous movements or postures, initial encounter: Secondary | ICD-10-CM | POA: Insufficient documentation

## 2016-11-24 DIAGNOSIS — Y9367 Activity, basketball: Secondary | ICD-10-CM | POA: Insufficient documentation

## 2016-11-24 DIAGNOSIS — S8991XA Unspecified injury of right lower leg, initial encounter: Secondary | ICD-10-CM | POA: Insufficient documentation

## 2016-11-24 DIAGNOSIS — M25461 Effusion, right knee: Secondary | ICD-10-CM | POA: Insufficient documentation

## 2016-11-24 DIAGNOSIS — Y9289 Other specified places as the place of occurrence of the external cause: Secondary | ICD-10-CM | POA: Insufficient documentation

## 2016-11-24 DIAGNOSIS — Y999 Unspecified external cause status: Secondary | ICD-10-CM | POA: Insufficient documentation

## 2016-11-24 DIAGNOSIS — M25561 Pain in right knee: Secondary | ICD-10-CM

## 2016-11-24 NOTE — ED Notes (Signed)
Patient transported to X-ray 

## 2016-11-24 NOTE — ED Triage Notes (Signed)
Onset today playing basketball, jumped up and landed on right knee.  Now painful when putting full weight and bending knee.

## 2016-11-25 NOTE — Discharge Instructions (Signed)
Please take Ibuprofen (Advil, motrin) and Tylenol (acetaminophen) to relieve your pain.  You may take up to 800 MG (4 pills) of normal strength ibuprofen every 8 hours as needed.  In between doses of ibuprofen you make take tylenol, up to 1,000 mg (two extra strength pills).  Do not take more than 3,000 mg tylenol in a 24 hour period.  Please check all medication labels as many medications such as pain and cold medications may contain tylenol.  Do not drink alcohol while taking these medications.  Do not take other NSAID'S while taking ibuprofen (such as aleve or naproxen).  Please take ibuprofen with food to decrease stomach upset. ° ° °

## 2016-11-25 NOTE — ED Provider Notes (Signed)
[facility]-EMERGENCY DEPT Provider Note   CSN: [id number] Arrival date & time: 11/24/16  2146     History   Chief Complaint Chief Complaint  Patient presents with  . Knee Injury    HPI Candida PeelingRonald S Pearlman is a 26 y.o. male who Presents after he reportedly landed "funny" on his right knee while playing basketball this afternoon. He reports pain to the medial side of his knee that is worse with weightbearing. He has not tried anything PTA for his pain.  He reports that this is an isolated injury.  HPI  History reviewed. No pertinent past medical history.  There are no active problems to display for this patient.   History reviewed. No pertinent surgical history.     Home Medications    Prior to Admission medications   Medication Sig Start Date End Date Taking? Authorizing Provider  amoxicillin-clavulanate (AUGMENTIN) 875-125 MG tablet Take 1 tablet by mouth every 12 (twelve) hours. 07/09/16   Antony MaduraHumes, Kelly, PA-C  meclizine (ANTIVERT) 50 MG tablet Take 1 tablet (50 mg total) by mouth 3 (three) times daily as needed. 06/11/14   Fayrene Helperran, Bowie, PA-C  methocarbamol (ROBAXIN) 500 MG tablet Take 1 tablet (500 mg total) by mouth 2 (two) times daily. 10/08/14   Fayrene Helperran, Bowie, PA-C  naproxen (NAPROSYN) 500 MG tablet Take 1 tablet (500 mg total) by mouth 2 (two) times daily. 10/08/14   Fayrene Helperran, Bowie, PA-C    Family History History reviewed. No pertinent family history.  Social History Social History  Substance Use Topics  . Smoking status: Current Every Day Smoker    Types: Cigarettes  . Smokeless tobacco: Never Used  . Alcohol use Yes     Comment: rarely     Allergies   Patient has no known allergies.   Review of Systems Review of Systems  Musculoskeletal: Positive for arthralgias. Negative for joint swelling and myalgias.  Skin: Negative for color change, rash and wound.  Neurological: Negative for numbness.     Physical Exam Updated Vital Signs BP 112/67   Pulse 76   Temp 98.3 F  (36.8 C) (Oral)   Resp 16   Ht 5\' 9"  (1.753 m)   Wt 65.8 kg (145 lb)   SpO2 98%   BMI 21.41 kg/m   Physical Exam  Constitutional: He appears well-developed and well-nourished.  HENT:  Head: Normocephalic and atraumatic.  Musculoskeletal:  Right knee with mildly decreased range of motion secondary to pain. Knee is stable to valgus/varus stress, normal anterior/posterior drawer test. No obvious wounds to the knee. No obvious deformities or edema. Mild tenderness to palpation along the medial joint line.  Neurological: No sensory deficit.  Skin: Skin is warm and dry. No rash noted. He is not diaphoretic.  Psychiatric: He has a normal mood and affect. His behavior is normal.  Nursing note and vitals reviewed.    ED Treatments / Results  Labs (all labs ordered are listed, but only abnormal results are displayed) Labs Reviewed - No data to display  EKG  EKG Interpretation None       Radiology Dg Knee Complete 4 Views Right  Result Date: 11/24/2016 CLINICAL DATA:  Right knee pain after twisting injury playing basketball EXAM: RIGHT KNEE - COMPLETE 4+ VIEW COMPARISON:  None. FINDINGS: No acute fracture or dislocation. Small joint effusion. The alignment and joint spaces are maintained. Mild soft tissue edema most prominent medially. IMPRESSION: No fracture or subluxation.  Small knee joint effusion. Electronically Signed   By: Shawna OrleansMelanie  Ehinger M.D.   On: 11/24/2016 23:31    Procedures Procedures (including critical care time)  Medications Ordered in ED Medications - No data to display   Initial Impression / Assessment and Plan / ED Course  I have reviewed the triage vital signs and the nursing notes.  Pertinent labs & imaging results that were available during my care of the patient were reviewed by me and considered in my medical decision making (see chart for details).    Pt with mild swelling to the joint spaces, knee swelling, tightness in the knee, and mildly  restricted range of motion. Pt unable to perform full flexion of the knee.  Pt is without systemic symptoms, erythema or redness of the joint consistent with gout or septic joint.  Patient X-Ray negative for obvious fracture or dislocation. Pain managed in ED. Pt advised to follow up with orthopedics if symptoms persist for further evaluation and treatment. Patient given brace and crutches while in ED, conservative therapy recommended and discussed. Patient will be dc home & is agreeable with above plan.   Final Clinical Impressions(s) / ED Diagnoses   Final diagnoses:  Injury of right knee, initial encounter  Acute pain of right knee  Effusion of right knee    New Prescriptions New Prescriptions   No medications on file     Norman Clay 11/25/16 0050    Zadie Rhine, MD 11/25/16 [phone]

## 2016-11-25 NOTE — Progress Notes (Signed)
Orthopedic Tech Progress Note Patient Details:  Candida PeelingRonald S Uselman 05/06/1991 [id number]  Ortho Devices Type of Ortho Device: Knee Immobilizer, Crutches Ortho Device/Splint Location: rle Ortho Device/Splint Interventions: Ordered, Application, Adjustment   Trinna PostMartinez, Euan Wandler J 11/25/2016, 12:41 AM

## 2016-11-25 NOTE — ED Notes (Signed)
Ortho tech called for knee immobilizer and crutches 

## 2017-05-16 ENCOUNTER — Encounter (HOSPITAL_COMMUNITY): Payer: Self-pay

## 2017-05-16 ENCOUNTER — Emergency Department (HOSPITAL_COMMUNITY)
Admission: EM | Admit: 2017-05-16 | Discharge: 2017-05-17 | Disposition: A | Discharge status: Home / Self Care | Payer: No Typology Code available for payment source | Attending: Emergency Medicine | Admitting: Emergency Medicine

## 2017-05-16 DIAGNOSIS — M545 Low back pain, unspecified: Secondary | ICD-10-CM

## 2017-05-16 DIAGNOSIS — Y999 Unspecified external cause status: Secondary | ICD-10-CM | POA: Insufficient documentation

## 2017-05-16 DIAGNOSIS — F17210 Nicotine dependence, cigarettes, uncomplicated: Secondary | ICD-10-CM | POA: Diagnosis not present

## 2017-05-16 DIAGNOSIS — Z79899 Other long term (current) drug therapy: Secondary | ICD-10-CM | POA: Diagnosis not present

## 2017-05-16 DIAGNOSIS — Y92410 Unspecified street and highway as the place of occurrence of the external cause: Secondary | ICD-10-CM | POA: Diagnosis not present

## 2017-05-16 DIAGNOSIS — Y939 Activity, unspecified: Secondary | ICD-10-CM | POA: Insufficient documentation

## 2017-05-16 MED ORDER — CYCLOBENZAPRINE HCL 5 MG PO TABS: 5.0000 mg | ORAL_TABLET | Freq: Every evening | ORAL | 0 refills | Status: DC | PRN

## 2017-05-16 NOTE — Discharge Instructions (Signed)
Take ibuprofen 3 times a day with meals.  Do not take other anti-inflammatories at the same time open (Advil, Motrin, naproxen, Aleve). You may supplement with Tylenol if you need further pain control. °Use Flexeril as needed for muscle stiffness or soreness.  Have caution, this may make you tired or groggy.  Do not drive or operate heavy machinery while taking this medicine. °Use ice packs or heating pads if this helps control your pain. °You likely have continued muscle stiffness and soreness over the next couple days.   °Return to the emergency room if you develop vision changes, vomiting, slurred speech, numbness, loss of bowel or bladder control, or any new or worsening symptoms. ° °
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
## 2017-05-16 NOTE — ED Notes (Signed)
Patient is A&Ox4.  No signs of distress noted.  Please see providers complete history and physical exam.  
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
## 2018-04-28 ENCOUNTER — Emergency Department (HOSPITAL_COMMUNITY): Payer: Self-pay

## 2018-04-28 ENCOUNTER — Ambulatory Visit (INDEPENDENT_AMBULATORY_CARE_PROVIDER_SITE_OTHER): Payer: Self-pay | Admitting: Physician Assistant

## 2018-04-28 ENCOUNTER — Encounter (HOSPITAL_COMMUNITY): Payer: Self-pay | Admitting: Emergency Medicine

## 2018-04-28 ENCOUNTER — Inpatient Hospital Stay (HOSPITAL_COMMUNITY)
Admission: EM | Admit: 2018-04-28 | Discharge: 2018-05-01 | DRG: 494 | Disposition: A | Discharge status: Home / Self Care | Payer: Self-pay | Attending: Orthopedic Surgery | Admitting: Orthopedic Surgery

## 2018-04-28 DIAGNOSIS — F17210 Nicotine dependence, cigarettes, uncomplicated: Secondary | ICD-10-CM | POA: Diagnosis present

## 2018-04-28 DIAGNOSIS — Z716 Tobacco abuse counseling: Secondary | ICD-10-CM

## 2018-04-28 DIAGNOSIS — S92212A Displaced fracture of cuboid bone of left foot, initial encounter for closed fracture: Secondary | ICD-10-CM | POA: Diagnosis present

## 2018-04-28 DIAGNOSIS — S9782XA Crushing injury of left foot, initial encounter: Secondary | ICD-10-CM | POA: Diagnosis present

## 2018-04-28 DIAGNOSIS — S92251A Displaced fracture of navicular [scaphoid] of right foot, initial encounter for closed fracture: Secondary | ICD-10-CM

## 2018-04-28 DIAGNOSIS — S9305XA Dislocation of left ankle joint, initial encounter: Secondary | ICD-10-CM | POA: Diagnosis present

## 2018-04-28 DIAGNOSIS — S92902A Unspecified fracture of left foot, initial encounter for closed fracture: Secondary | ICD-10-CM

## 2018-04-28 DIAGNOSIS — S92142A Displaced dome fracture of left talus, initial encounter for closed fracture: Principal | ICD-10-CM | POA: Diagnosis present

## 2018-04-28 DIAGNOSIS — S92252A Displaced fracture of navicular [scaphoid] of left foot, initial encounter for closed fracture: Secondary | ICD-10-CM | POA: Diagnosis present

## 2018-04-28 DIAGNOSIS — W11XXXA Fall on and from ladder, initial encounter: Secondary | ICD-10-CM | POA: Diagnosis present

## 2018-04-28 LAB — CBC
HCT: 41.2 % (ref 39.0–52.0)
Hemoglobin: 14.1 g/dL (ref 13.0–17.0)
MCH: 31.7 pg (ref 26.0–34.0)
MCHC: 34.2 g/dL (ref 30.0–36.0)
MCV: 92.6 fL (ref 80.0–100.0)
PLATELETS: 215 10*3/uL (ref 150–400)
RBC: 4.45 MIL/uL (ref 4.22–5.81)
RDW: 12.3 % (ref 11.5–15.5)
WBC: 17.1 10*3/uL — ABNORMAL HIGH (ref 4.0–10.5)
nRBC: 0 % (ref 0.0–0.2)

## 2018-04-28 LAB — CREATININE, SERUM
CREATININE: 0.84 mg/dL (ref 0.61–1.24)
GFR calc Af Amer: >60 mL/min (ref >60)

## 2018-04-28 MED ORDER — OXYCODONE HCL 5 MG PO TABS
5.0000 mg | ORAL_TABLET | ORAL | Status: DC | PRN
  Administered 2018-04-28 (×2): 10 mg via ORAL
  Filled 2018-04-28 (×2): qty 2

## 2018-04-28 MED ORDER — METHOCARBAMOL 500 MG PO TABS
500.0000 mg | ORAL_TABLET | Freq: Four times a day (QID) | ORAL | Status: DC | PRN
  Administered 2018-04-28 – 2018-04-29 (×2): 500 mg via ORAL
  Filled 2018-04-28 (×2): qty 1

## 2018-04-28 MED ORDER — ACETAMINOPHEN 325 MG PO TABS: 650.0000 mg | ORAL_TABLET | Freq: Four times a day (QID) | ORAL | Status: DC | PRN

## 2018-04-28 MED ORDER — MORPHINE SULFATE (PF) 2 MG/ML IV SOLN
2.0000 mg | INTRAVENOUS | Status: DC | PRN
  Administered 2018-04-29 – 2018-04-30 (×2): 2 mg via INTRAVENOUS
  Filled 2018-04-28 (×2): qty 1

## 2018-04-28 MED ORDER — ONDANSETRON HCL 4 MG/2ML IJ SOLN: 4.0000 mg | Freq: Four times a day (QID) | INTRAMUSCULAR | Status: DC | PRN

## 2018-04-28 MED ORDER — POVIDONE-IODINE 10 % EX SWAB: 2.0000 "application " | Freq: Once | CUTANEOUS | Status: DC

## 2018-04-28 MED ORDER — METHOCARBAMOL 1000 MG/10ML IJ SOLN
500.0000 mg | Freq: Four times a day (QID) | INTRAVENOUS | Status: DC | PRN
  Filled 2018-04-28: qty 5

## 2018-04-28 MED ORDER — ONDANSETRON HCL 4 MG PO TABS
4.0000 mg | ORAL_TABLET | Freq: Four times a day (QID) | ORAL | Status: DC | PRN
  Administered 2018-04-28: 4 mg via ORAL
  Filled 2018-04-28: qty 1

## 2018-04-28 MED ORDER — ACETAMINOPHEN 650 MG RE SUPP: 650.0000 mg | Freq: Four times a day (QID) | RECTAL | Status: DC | PRN

## 2018-04-28 MED ORDER — OXYCODONE-ACETAMINOPHEN 5-325 MG PO TABS
1.0000 | ORAL_TABLET | Freq: Once | ORAL | Status: AC
  Administered 2018-04-28: 1 via ORAL
  Filled 2018-04-28: qty 1

## 2018-04-28 MED ORDER — CHLORHEXIDINE GLUCONATE 4 % EX LIQD
60.0000 mL | Freq: Once | CUTANEOUS | Status: DC
  Filled 2018-04-28: qty 15

## 2018-04-28 MED ORDER — CEFAZOLIN SODIUM-DEXTROSE 2-4 GM/100ML-% IV SOLN
2.0000 g | INTRAVENOUS | Status: AC
  Administered 2018-04-29: 2 g via INTRAVENOUS
  Filled 2018-04-28: qty 100

## 2018-04-28 MED ORDER — BACITRACIN ZINC 500 UNIT/GM EX OINT
TOPICAL_OINTMENT | Freq: Two times a day (BID) | CUTANEOUS | Status: DC
  Administered 2018-04-28 – 2018-04-29 (×3): via TOPICAL
  Filled 2018-04-28: qty 28.4

## 2018-04-28 MED ORDER — ENOXAPARIN SODIUM 40 MG/0.4ML ~~LOC~~ SOLN
40.0000 mg | SUBCUTANEOUS | Status: DC
  Administered 2018-04-30: 40 mg via SUBCUTANEOUS
  Filled 2018-04-28: qty 0.4

## 2018-04-28 NOTE — Consult Note (Signed)
Reason for Consult:Left foot fxs Referring Physician: Benny Deutschman is an 27 y.o. male.  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 He was coming off a roof when his ladder slid out from under him and he fell about 10 feet onto a parking lot. He had immediate foot pain and it seemed to move the wrong way when he fell. He came to the ED for evaluation and x-rays showed multiple midfoot fxs and orthopedic surgery was consulted.  History reviewed. No pertinent past medical history.  History reviewed. No pertinent surgical history.  No family history on file.  Social History:  reports that he has been smoking cigarettes. He has never used smokeless tobacco. He reports that he drinks alcohol. He reports that he has current or past drug history. Drug: Marijuana.  Allergies: No Known Allergies  Medications: I have reviewed the patient's current medications.  No results found for this or any previous visit (from the past 48 hour(s)).  Dg Ankle Complete Left  Result Date: 04/28/2018 CLINICAL DATA:  Larey Seat from ladder today twisting the foot and ankle EXAM: LEFT ANKLE COMPLETE - 3+ VIEW COMPARISON:  None. FINDINGS: The ankle joint appears normal. Alignment is normal. No ankle fracture is seen. Some irregularity on the lateral view over the dorsal aspect of the tarsal navicula may indicate fracture as noted on left foot films and CT of the left foot is recommended. IMPRESSION: Negative left ankle. Cannot exclude fracture of the tarsal navicula. Recommend CT of the left foot. Electronically Signed   By: Dwyane Dee M.D.   On: 04/28/2018 11:02   Ct Foot Left Wo Contrast  Result Date: 04/28/2018 CLINICAL DATA:  Larey Seat from a ladder. Evaluate complex foot fractures. EXAM: CT OF THE LEFT FOOT WITHOUT CONTRAST TECHNIQUE: Multidetector CT imaging of the left foot was performed according to the standard protocol. Multiplanar CT image reconstructions were also generated. COMPARISON:  Radiographs  04/28/2018 FINDINGS: Complex fracture dislocation involving the talonavicular joint. There is a complex comminuted intra-articular fracture of the navicular bone laterally with multiple small bone fragments. The talus is subluxed medially in relation to the talus the talar head appears stuck against the medial margin of the fracture. There is also a complex comminuted fracture of the cuboid which involves both articular surfaces. However, the calcaneus and bases of the fourth and fifth metatarsals are intact. The cuneiforms are intact and the cuneiform articulations with the first, second and third metatarsal bases appears maintained. No obvious Lisfranc injury. The ankle joint is maintained. No definite fractures of the tibia or fibula are identified. No definite fracture of the talus. IMPRESSION: 1. Medial dislocation of the navicular bone at the talonavicular joint. Associated comminuted fracture of the lateral navicular bone. No talar fracture is identified. 2. Comminuted intra-articular fractures of the cuboid but the calcaneus and fourth and fifth metatarsals are intact. 3. Intact cuneiforms and metatarsals. The joint spaces are maintained. No Lisfranc injury. Electronically Signed   By: Rudie Meyer M.D.   On: 04/28/2018 14:05   Dg Foot Complete Left  Result Date: 04/28/2018 CLINICAL DATA:  Larey Seat off ladder today twisting the foot and ankle EXAM: LEFT FOOT - COMPLETE 3+ VIEW COMPARISON:  None. FINDINGS: There is some irregularity of the lateral portion of the tarsal navicula and fracture cannot be excluded. On the lateral view there is a small bony fragment over the dorsal aspect apparently in the region of the tarsal navicula and fracture is a definite consideration. CT  of the left foot is recommended. No additional fracture is seen. Joint spaces appear normal. No erosion is noted. IMPRESSION: Suspect fracture of the tarsal navicula laterally. Recommend CT of the left foot to assess further.  Electronically Signed   By: Dwyane DeePaul  Barry M.D.   On: 04/28/2018 11:01    Review of Systems  Constitutional: Negative for weight loss.  HENT: Negative for ear discharge, ear pain, hearing loss and tinnitus.   Eyes: Negative for blurred vision, double vision, photophobia and pain.  Respiratory: Negative for cough, sputum production and shortness of breath.   Cardiovascular: Negative for chest pain.  Gastrointestinal: Negative for abdominal pain, nausea and vomiting.  Genitourinary: Negative for dysuria, flank pain, frequency and urgency.  Musculoskeletal: Positive for joint pain (Left foot). Negative for back pain, falls, myalgias and neck pain.  Neurological: Negative for dizziness, tingling, sensory change, focal weakness, loss of consciousness and headaches.  Endo/Heme/Allergies: Does not bruise/bleed easily.  Psychiatric/Behavioral: Negative for depression, memory loss and substance abuse. The patient is not nervous/anxious.    Blood pressure 115/70, pulse 62, temperature 98.1 F (36.7 C), temperature source Oral, resp. rate 18, SpO2 100 %. Physical Exam  Constitutional: He appears well-developed and well-nourished. No distress.  HENT:  Head: Normocephalic and atraumatic.  Eyes: Conjunctivae are normal. Right eye exhibits no discharge. Left eye exhibits no discharge. No scleral icterus.  Neck: Normal range of motion.  Cardiovascular: Normal rate and regular rhythm.  Respiratory: Effort normal. No respiratory distress.  Musculoskeletal:  LLE No traumatic wounds, ecchymosis, or rash  Severe TTP midfoot  No knee or ankle effusion  Knee stable to varus/ valgus and anterior/posterior stress  Sens DPN, SPN, paresthetic, TN absent  Motor EHL, ext, flex, evers 5/5  DP 2+, PT 2+, Mild NP edema  Neurological: He is alert.  Skin: Skin is warm and dry. He is not diaphoretic.  Psychiatric: He has a normal mood and affect. His behavior is normal.    Assessment/Plan: Left foot fxs -- Plan on  ORIF tomorrow by Dr. Lajoyce Cornersuda. NPO after MN.     Freeman CaldronMichael J. Kaysha Parsell, PA-C Orthopedic Surgery [phone] 04/28/2018, 2:45 PM

## 2018-04-28 NOTE — Consult Note (Signed)
Orthopedic admission H&P.  REQUESTING PHYSICIAN: Nadara Mustarduda, Omarion Minnehan V, MD  Chief Complaint: Left foot pain.  HPI: Candida PeelingRonald S Fry is a 27 y.o. male who presents with a dislocated talonavicular joint with segmental fracture of the navicular with minimally displaced fracture of the cuboid.  Patient states he was on a ladder one floor off the ground when he fell from the ladder.  Patient states that he does smoke.  Patient states he wanted to go to his car to smoke a cigarette after his injury.  History reviewed. No pertinent past medical history. History reviewed. No pertinent surgical history. Social History   Socioeconomic History  . Marital status: Single    Spouse name: Not on file  . Number of children: Not on file  . Years of education: Not on file  . Highest education level: Not on file  Occupational History  . Not on file  Social Needs  . Financial resource strain: Not on file  . Food insecurity:    Worry: Not on file    Inability: Not on file  . Transportation needs:    Medical: Not on file    Non-medical: Not on file  Tobacco Use  . Smoking status: Current Every Day Smoker    Types: Cigarettes  . Smokeless tobacco: Never Used  Substance and Sexual Activity  . Alcohol use: Yes    Comment: rarely  . Drug use: Yes    Types: Marijuana  . Sexual activity: Not on file  Lifestyle  . Physical activity:    Days per week: Not on file    Minutes per session: Not on file  . Stress: Not on file  Relationships  . Social connections:    Talks on phone: Not on file    Gets together: Not on file    Attends religious service: Not on file    Active member of club or organization: Not on file    Attends meetings of clubs or organizations: Not on file    Relationship status: Not on file  Other Topics Concern  . Not on file  Social History Narrative  . Not on file   No family history on file. - negative except otherwise stated in the family history section No Known  Allergies Prior to Admission medications   Medication Sig Start Date End Date Taking? Authorizing Provider  amoxicillin-clavulanate (AUGMENTIN) 875-125 MG tablet Take 1 tablet by mouth every 12 (twelve) hours. Patient not taking: Reported on 04/28/2018 07/09/16   Antony MaduraHumes, Kelly, PA-C  cyclobenzaprine (FLEXERIL) 5 MG tablet Take 1 tablet (5 mg total) by mouth at bedtime as needed for muscle spasms. Patient not taking: Reported on 04/28/2018 05/16/17   Caccavale, Sophia, PA-C  meclizine (ANTIVERT) 50 MG tablet Take 1 tablet (50 mg total) by mouth 3 (three) times daily as needed. Patient not taking: Reported on 04/28/2018 06/11/14   Fayrene Helperran, Bowie, PA-C  methocarbamol (ROBAXIN) 500 MG tablet Take 1 tablet (500 mg total) by mouth 2 (two) times daily. Patient not taking: Reported on 04/28/2018 10/08/14   Fayrene Helperran, Bowie, PA-C  naproxen (NAPROSYN) 500 MG tablet Take 1 tablet (500 mg total) by mouth 2 (two) times daily. Patient not taking: Reported on 04/28/2018 10/08/14   Fayrene Helperran, Bowie, PA-C   Dg Ankle Complete Left  Result Date: 04/28/2018 CLINICAL DATA:  Aaron SeatFell from ladder today twisting the foot and ankle EXAM: LEFT ANKLE COMPLETE - 3+ VIEW COMPARISON:  None. FINDINGS: The ankle joint appears normal. Alignment is normal. No  ankle fracture is seen. Some irregularity on the lateral view over the dorsal aspect of the tarsal navicula may indicate fracture as noted on left foot films and CT of the left foot is recommended. IMPRESSION: Negative left ankle. Cannot exclude fracture of the tarsal navicula. Recommend CT of the left foot. Electronically Signed   By: Dwyane Dee M.D.   On: 04/28/2018 11:02   Ct Foot Left Wo Contrast  Result Date: 04/28/2018 CLINICAL DATA:  Aaron Fry from a ladder. Evaluate complex foot fractures. EXAM: CT OF THE LEFT FOOT WITHOUT CONTRAST TECHNIQUE: Multidetector CT imaging of the left foot was performed according to the standard protocol. Multiplanar CT image reconstructions were also generated.  COMPARISON:  Radiographs 04/28/2018 FINDINGS: Complex fracture dislocation involving the talonavicular joint. There is a complex comminuted intra-articular fracture of the navicular bone laterally with multiple small bone fragments. The talus is subluxed medially in relation to the talus the talar head appears stuck against the medial margin of the fracture. There is also a complex comminuted fracture of the cuboid which involves both articular surfaces. However, the calcaneus and bases of the fourth and fifth metatarsals are intact. The cuneiforms are intact and the cuneiform articulations with the first, second and third metatarsal bases appears maintained. No obvious Lisfranc injury. The ankle joint is maintained. No definite fractures of the tibia or fibula are identified. No definite fracture of the talus. IMPRESSION: 1. Medial dislocation of the navicular bone at the talonavicular joint. Associated comminuted fracture of the lateral navicular bone. No talar fracture is identified. 2. Comminuted intra-articular fractures of the cuboid but the calcaneus and fourth and fifth metatarsals are intact. 3. Intact cuneiforms and metatarsals. The joint spaces are maintained. No Lisfranc injury. Electronically Signed   By: Rudie Meyer M.D.   On: 04/28/2018 14:05   Dg Foot Complete Left  Result Date: 04/28/2018 CLINICAL DATA:  Aaron Fry off ladder today twisting the foot and ankle EXAM: LEFT FOOT - COMPLETE 3+ VIEW COMPARISON:  None. FINDINGS: There is some irregularity of the lateral portion of the tarsal navicula and fracture cannot be excluded. On the lateral view there is a small bony fragment over the dorsal aspect apparently in the region of the tarsal navicula and fracture is a definite consideration. CT of the left foot is recommended. No additional fracture is seen. Joint spaces appear normal. No erosion is noted. IMPRESSION: Suspect fracture of the tarsal navicula laterally. Recommend CT of the left foot to  assess further. Electronically Signed   By: Dwyane Dee M.D.   On: 04/28/2018 11:01   - pertinent xrays, CT, MRI studies were reviewed and independently interpreted  Positive ROS: All other systems have been reviewed and were otherwise negative with the exception of those mentioned in the HPI and as above.  Physical Exam: General: Alert, no acute distress Psychiatric: Patient is competent for consent with normal mood and affect Lymphatic: No axillary or cervical lymphadenopathy Cardiovascular: No pedal edema Respiratory: No cyanosis, no use of accessory musculature GI: No organomegaly, abdomen is soft and non-tender    Images:  @ENCIMAGES @  Labs:  No results found for: HGBA1C, ESRSEDRATE, CRP, LABURIC, REPTSTATUS, GRAMSTAIN, CULT, LABORGA  No results found for: ALBUMIN, PREALBUMIN, LABURIC  Neurologic: Patient does have protective sensation bilateral lower extremities.   MUSCULOSKELETAL:   Skin: Examination patient has abrasion on the left leg the skin in the foot is intact there is no redness no cellulitis there is minimal swelling.  Patient has a palpable dorsalis pedis  pulse.  There is a deformity through the midfoot.  He has good motion of the toes no signs or symptoms of a compartment syndrome.  Review of the CT scan radiographs shows a comminuted fracture and an dislocated navicular with a compression fracture of the cuboid essentially nondisplaced.  Assessment: Assessment: Talonavicular dislocation with complex fracture of the navicular and a crush injury to the cuboid.  Plan: Plan: We will plan for open reduction internal fixation of the navicular fracture and placement of an external fixator to hold the talonavicular joint reduced and to minimize stress across the comminuted fragments.  Risks and benefits of surgery were discussed including infection neurovascular injury nonhealing of the skin nonhealing of the bone potential for amputation need for additional surgery.   Patient states he understands and wished to proceed at this time discussed the importance of not using any tobacco products and not using any vaping products.  This would increase the risk of the skin not healing increased risk of infection increase the risk of the bone not healing.  Thank you for the consult and the opportunity to see Mr. Miquan Tandon, MD [facility] [phone] 5:10 PM

## 2018-04-28 NOTE — ED Triage Notes (Signed)
Patient to ED c/o L foot pain after falling off a 2-3 foot ladder this morning at 6am. He reports he's had pain since and thinks he broke something. Patient states he is unable to bear weight on L foot and cannot move or feel his toes. Pedal pulses equal bilaterally.

## 2018-04-28 NOTE — ED Notes (Signed)
Patient transported to x-ray. ?

## 2018-04-28 NOTE — ED Provider Notes (Signed)
[facility] EMERGENCY DEPARTMENT Provider Note   CSN: [id number] Arrival date & time: 04/28/18  1024     History   Chief Complaint Chief Complaint  Patient presents with  . Foot Pain    HPI Aaron Fry is a 27 y.o. male who presents to the ED with foot pain after falling off a ladder this morning. Patient reports he was about 4 steps up on a ladder when the ladder began to fall and patient's foot got caught in one of the steps and twisted it. He denies hitting his head, LOC or other injuries.   HPI  History reviewed. No pertinent past medical history.  Patient Active Problem List   Diagnosis Date Noted  . Displaced fracture of cuboid bone of left foot, initial encounter for closed fracture 04/28/2018    History reviewed. No pertinent surgical history.      Home Medications    Prior to Admission medications   Medication Sig Start Date End Date Taking? Authorizing Provider  amoxicillin-clavulanate (AUGMENTIN) 875-125 MG tablet Take 1 tablet by mouth every 12 (twelve) hours. 07/09/16   Antony Madura, PA-C  cyclobenzaprine (FLEXERIL) 5 MG tablet Take 1 tablet (5 mg total) by mouth at bedtime as needed for muscle spasms. 05/16/17   Caccavale, Sophia, PA-C  meclizine (ANTIVERT) 50 MG tablet Take 1 tablet (50 mg total) by mouth 3 (three) times daily as needed. 06/11/14   Fayrene Helper, PA-C  methocarbamol (ROBAXIN) 500 MG tablet Take 1 tablet (500 mg total) by mouth 2 (two) times daily. 10/08/14   Fayrene Helper, PA-C  naproxen (NAPROSYN) 500 MG tablet Take 1 tablet (500 mg total) by mouth 2 (two) times daily. 10/08/14   Fayrene Helper, PA-C    Family History No family history on file.  Social History Social History   Tobacco Use  . Smoking status: Current Every Day Smoker    Types: Cigarettes  . Smokeless tobacco: Never Used  Substance Use Topics  . Alcohol use: Yes    Comment: rarely  . Drug use: Yes    Types: Marijuana     Allergies   Patient has no  known allergies.   Review of Systems Review of Systems  Musculoskeletal: Positive for arthralgias and joint swelling.  All other systems reviewed and are negative.    Physical Exam Updated Vital Signs BP 115/70 (BP Location: Right Arm)   Pulse 62   Temp 98.1 F (36.7 C) (Oral)   Resp 18   SpO2 100%   Physical Exam  Constitutional: He appears well-developed and well-nourished. No distress.  HENT:  Head: Normocephalic and atraumatic.  Eyes: Conjunctivae and EOM are normal.  Neck: Normal range of motion. Neck supple.  Cardiovascular: Normal rate and intact distal pulses.  Pulmonary/Chest: Effort normal.  Abdominal: There is no tenderness.  Musculoskeletal:       Left ankle: He exhibits normal range of motion, no deformity and no laceration. Swelling: mild. Tenderness. Lateral malleolus tenderness found. Achilles tendon normal.       Legs:      Left foot: There is tenderness and swelling. There is normal capillary refill and no laceration. Decreased range of motion: due to pain.  Pedal pulse decreased in left compared to right. Abrasion to left lower leg anterior aspect.  Neurological: He is alert.  Skin: Skin is warm and dry.  Psychiatric: He has a normal mood and affect.  Nursing note and vitals reviewed.    ED Treatments / Results  Labs (  all labs ordered are listed, but only abnormal results are displayed) Labs Reviewed  HIV ANTIBODY (ROUTINE TESTING W REFLEX)  CBC  CREATININE, SERUM    Radiology Dg Ankle Complete Left  Result Date: 04/28/2018 CLINICAL DATA:  Larey SeatFell from ladder today twisting the foot and ankle EXAM: LEFT ANKLE COMPLETE - 3+ VIEW COMPARISON:  None. FINDINGS: The ankle joint appears normal. Alignment is normal. No ankle fracture is seen. Some irregularity on the lateral view over the dorsal aspect of the tarsal navicula may indicate fracture as noted on left foot films and CT of the left foot is recommended. IMPRESSION: Negative left ankle. Cannot  exclude fracture of the tarsal navicula. Recommend CT of the left foot. Electronically Signed   By: Dwyane DeePaul  Barry M.D.   On: 04/28/2018 11:02   Ct Foot Left Wo Contrast  Result Date: 04/28/2018 CLINICAL DATA:  Larey SeatFell from a ladder. Evaluate complex foot fractures. EXAM: CT OF THE LEFT FOOT WITHOUT CONTRAST TECHNIQUE: Multidetector CT imaging of the left foot was performed according to the standard protocol. Multiplanar CT image reconstructions were also generated. COMPARISON:  Radiographs 04/28/2018 FINDINGS: Complex fracture dislocation involving the talonavicular joint. There is a complex comminuted intra-articular fracture of the navicular bone laterally with multiple small bone fragments. The talus is subluxed medially in relation to the talus the talar head appears stuck against the medial margin of the fracture. There is also a complex comminuted fracture of the cuboid which involves both articular surfaces. However, the calcaneus and bases of the fourth and fifth metatarsals are intact. The cuneiforms are intact and the cuneiform articulations with the first, second and third metatarsal bases appears maintained. No obvious Lisfranc injury. The ankle joint is maintained. No definite fractures of the tibia or fibula are identified. No definite fracture of the talus. IMPRESSION: 1. Medial dislocation of the navicular bone at the talonavicular joint. Associated comminuted fracture of the lateral navicular bone. No talar fracture is identified. 2. Comminuted intra-articular fractures of the cuboid but the calcaneus and fourth and fifth metatarsals are intact. 3. Intact cuneiforms and metatarsals. The joint spaces are maintained. No Lisfranc injury. Electronically Signed   By: Rudie MeyerP.  Gallerani M.D.   On: 04/28/2018 14:05   Dg Foot Complete Left  Result Date: 04/28/2018 CLINICAL DATA:  Larey SeatFell off ladder today twisting the foot and ankle EXAM: LEFT FOOT - COMPLETE 3+ VIEW COMPARISON:  None. FINDINGS: There is some  irregularity of the lateral portion of the tarsal navicula and fracture cannot be excluded. On the lateral view there is a small bony fragment over the dorsal aspect apparently in the region of the tarsal navicula and fracture is a definite consideration. CT of the left foot is recommended. No additional fracture is seen. Joint spaces appear normal. No erosion is noted. IMPRESSION: Suspect fracture of the tarsal navicula laterally. Recommend CT of the left foot to assess further. Electronically Signed   By: Dwyane DeePaul  Barry M.D.   On: 04/28/2018 11:01    Procedures Procedures (including critical care time)  Medications Ordered in ED Medications  bacitracin ointment ( Topical Given 04/28/18 1152)  acetaminophen (TYLENOL) tablet 650 mg (has no administration in time range)    Or  acetaminophen (TYLENOL) suppository 650 mg (has no administration in time range)  enoxaparin (LOVENOX) injection 40 mg (has no administration in time range)  methocarbamol (ROBAXIN) tablet 500 mg (has no administration in time range)    Or  methocarbamol (ROBAXIN) 500 mg in dextrose 5 % 50 mL IVPB (  has no administration in time range)  morphine 2 MG/ML injection 2 mg (has no administration in time range)  ondansetron (ZOFRAN) tablet 4 mg (has no administration in time range)    Or  ondansetron (ZOFRAN) injection 4 mg (has no administration in time range)  oxyCODONE (Oxy IR/ROXICODONE) immediate release tablet 5-15 mg (has no administration in time range)  oxyCODONE-acetaminophen (PERCOCET/ROXICET) 5-325 MG per tablet 1 tablet (1 tablet Oral Given 04/28/18 1041)  oxyCODONE-acetaminophen (PERCOCET/ROXICET) 5-325 MG per tablet 1 tablet (1 tablet Oral Given 04/28/18 1333)     Initial Impression / Assessment and Plan / ED Course  I have reviewed the triage vital signs and the nursing notes. 27 y.o. male here with injury to the left foot. Ortho consult and after evaluation of the patient they will admit and patient is  scheduled for surgery tomorrow. Patient agrees with plan. Care turned over to orthopedic service.  Final Clinical Impressions(s) / ED Diagnoses   Final diagnoses:  Multiple closed fractures of left foot, initial encounter    ED Discharge Orders    None       Kerrie [place], NP 04/28/18 1459    Pricilla Loveless, MD 04/28/18 1836

## 2018-04-28 NOTE — H&P (View-Only) (Signed)
   Orthopedic admission H&P.  REQUESTING PHYSICIAN: Duda, Marcus V, MD  Chief Complaint: Left foot pain.  HPI: Aaron Fry is a 27 y.o. male who presents with a dislocated talonavicular joint with segmental fracture of the navicular with minimally displaced fracture of the cuboid.  Patient states he was on a ladder one floor off the ground when he fell from the ladder.  Patient states that he does smoke.  Patient states he wanted to go to his car to smoke a cigarette after his injury.  History reviewed. No pertinent past medical history. History reviewed. No pertinent surgical history. Social History   Socioeconomic History  . Marital status: Single    Spouse name: Not on file  . Number of children: Not on file  . Years of education: Not on file  . Highest education level: Not on file  Occupational History  . Not on file  Social Needs  . Financial resource strain: Not on file  . Food insecurity:    Worry: Not on file    Inability: Not on file  . Transportation needs:    Medical: Not on file    Non-medical: Not on file  Tobacco Use  . Smoking status: Current Every Day Smoker    Types: Cigarettes  . Smokeless tobacco: Never Used  Substance and Sexual Activity  . Alcohol use: Yes    Comment: rarely  . Drug use: Yes    Types: Marijuana  . Sexual activity: Not on file  Lifestyle  . Physical activity:    Days per week: Not on file    Minutes per session: Not on file  . Stress: Not on file  Relationships  . Social connections:    Talks on phone: Not on file    Gets together: Not on file    Attends religious service: Not on file    Active member of club or organization: Not on file    Attends meetings of clubs or organizations: Not on file    Relationship status: Not on file  Other Topics Concern  . Not on file  Social History Narrative  . Not on file   No family history on file. - negative except otherwise stated in the family history section No Known  Allergies Prior to Admission medications   Medication Sig Start Date End Date Taking? Authorizing Provider  amoxicillin-clavulanate (AUGMENTIN) 875-125 MG tablet Take 1 tablet by mouth every 12 (twelve) hours. Patient not taking: Reported on 04/28/2018 07/09/16   Humes, Kelly, PA-C  cyclobenzaprine (FLEXERIL) 5 MG tablet Take 1 tablet (5 mg total) by mouth at bedtime as needed for muscle spasms. Patient not taking: Reported on 04/28/2018 05/16/17   Caccavale, Sophia, PA-C  meclizine (ANTIVERT) 50 MG tablet Take 1 tablet (50 mg total) by mouth 3 (three) times daily as needed. Patient not taking: Reported on 04/28/2018 06/11/14   Tran, Bowie, PA-C  methocarbamol (ROBAXIN) 500 MG tablet Take 1 tablet (500 mg total) by mouth 2 (two) times daily. Patient not taking: Reported on 04/28/2018 10/08/14   Tran, Bowie, PA-C  naproxen (NAPROSYN) 500 MG tablet Take 1 tablet (500 mg total) by mouth 2 (two) times daily. Patient not taking: Reported on 04/28/2018 10/08/14   Tran, Bowie, PA-C   Dg Ankle Complete Left  Result Date: 04/28/2018 CLINICAL DATA:  Fell from ladder today twisting the foot and ankle EXAM: LEFT ANKLE COMPLETE - 3+ VIEW COMPARISON:  None. FINDINGS: The ankle joint appears normal. Alignment is normal. No   ankle fracture is seen. Some irregularity on the lateral view over the dorsal aspect of the tarsal navicula may indicate fracture as noted on left foot films and CT of the left foot is recommended. IMPRESSION: Negative left ankle. Cannot exclude fracture of the tarsal navicula. Recommend CT of the left foot. Electronically Signed   By: Paul  Barry M.D.   On: 04/28/2018 11:02   Ct Foot Left Wo Contrast  Result Date: 04/28/2018 CLINICAL DATA:  Fell from a ladder. Evaluate complex foot fractures. EXAM: CT OF THE LEFT FOOT WITHOUT CONTRAST TECHNIQUE: Multidetector CT imaging of the left foot was performed according to the standard protocol. Multiplanar CT image reconstructions were also generated.  COMPARISON:  Radiographs 04/28/2018 FINDINGS: Complex fracture dislocation involving the talonavicular joint. There is a complex comminuted intra-articular fracture of the navicular bone laterally with multiple small bone fragments. The talus is subluxed medially in relation to the talus the talar head appears stuck against the medial margin of the fracture. There is also a complex comminuted fracture of the cuboid which involves both articular surfaces. However, the calcaneus and bases of the fourth and fifth metatarsals are intact. The cuneiforms are intact and the cuneiform articulations with the first, second and third metatarsal bases appears maintained. No obvious Lisfranc injury. The ankle joint is maintained. No definite fractures of the tibia or fibula are identified. No definite fracture of the talus. IMPRESSION: 1. Medial dislocation of the navicular bone at the talonavicular joint. Associated comminuted fracture of the lateral navicular bone. No talar fracture is identified. 2. Comminuted intra-articular fractures of the cuboid but the calcaneus and fourth and fifth metatarsals are intact. 3. Intact cuneiforms and metatarsals. The joint spaces are maintained. No Lisfranc injury. Electronically Signed   By: P.  Gallerani M.D.   On: 04/28/2018 14:05   Dg Foot Complete Left  Result Date: 04/28/2018 CLINICAL DATA:  Fell off ladder today twisting the foot and ankle EXAM: LEFT FOOT - COMPLETE 3+ VIEW COMPARISON:  None. FINDINGS: There is some irregularity of the lateral portion of the tarsal navicula and fracture cannot be excluded. On the lateral view there is a small bony fragment over the dorsal aspect apparently in the region of the tarsal navicula and fracture is a definite consideration. CT of the left foot is recommended. No additional fracture is seen. Joint spaces appear normal. No erosion is noted. IMPRESSION: Suspect fracture of the tarsal navicula laterally. Recommend CT of the left foot to  assess further. Electronically Signed   By: Paul  Barry M.D.   On: 04/28/2018 11:01   - pertinent xrays, CT, MRI studies were reviewed and independently interpreted  Positive ROS: All other systems have been reviewed and were otherwise negative with the exception of those mentioned in the HPI and as above.  Physical Exam: General: Alert, no acute distress Psychiatric: Patient is competent for consent with normal mood and affect Lymphatic: No axillary or cervical lymphadenopathy Cardiovascular: No pedal edema Respiratory: No cyanosis, no use of accessory musculature GI: No organomegaly, abdomen is soft and non-tender    Images:  @ENCIMAGES@  Labs:  No results found for: HGBA1C, ESRSEDRATE, CRP, LABURIC, REPTSTATUS, GRAMSTAIN, CULT, LABORGA  No results found for: ALBUMIN, PREALBUMIN, LABURIC  Neurologic: Patient does have protective sensation bilateral lower extremities.   MUSCULOSKELETAL:   Skin: Examination patient has abrasion on the left leg the skin in the foot is intact there is no redness no cellulitis there is minimal swelling.  Patient has a palpable dorsalis pedis   pulse.  There is a deformity through the midfoot.  He has good motion of the toes no signs or symptoms of a compartment syndrome.  Review of the CT scan radiographs shows a comminuted fracture and an dislocated navicular with a compression fracture of the cuboid essentially nondisplaced.  Assessment: Assessment: Talonavicular dislocation with complex fracture of the navicular and a crush injury to the cuboid.  Plan: Plan: We will plan for open reduction internal fixation of the navicular fracture and placement of an external fixator to hold the talonavicular joint reduced and to minimize stress across the comminuted fragments.  Risks and benefits of surgery were discussed including infection neurovascular injury nonhealing of the skin nonhealing of the bone potential for amputation need for additional surgery.   Patient states he understands and wished to proceed at this time discussed the importance of not using any tobacco products and not using any vaping products.  This would increase the risk of the skin not healing increased risk of infection increase the risk of the bone not healing.  Thank you for the consult and the opportunity to see Aaron Fry  Marcus Duda, MD [facility] [phone] 5:10 PM     

## 2018-04-29 ENCOUNTER — Encounter (HOSPITAL_COMMUNITY): Payer: Self-pay | Admitting: Certified Registered Nurse Anesthetist

## 2018-04-29 ENCOUNTER — Observation Stay (HOSPITAL_COMMUNITY): Payer: Self-pay | Admitting: Certified Registered Nurse Anesthetist

## 2018-04-29 ENCOUNTER — Encounter (HOSPITAL_COMMUNITY)
Admission: EM | Disposition: A | Discharge status: Home / Self Care | Payer: Self-pay | Source: Home / Self Care | Attending: Orthopedic Surgery

## 2018-04-29 ENCOUNTER — Other Ambulatory Visit: Payer: Self-pay

## 2018-04-29 DIAGNOSIS — S92252A Displaced fracture of navicular [scaphoid] of left foot, initial encounter for closed fracture: Secondary | ICD-10-CM

## 2018-04-29 HISTORY — PX: ORIF ANKLE FRACTURE: SHX5408

## 2018-04-29 HISTORY — PX: EXTERNAL FIXATION LEG: SHX1549

## 2018-04-29 LAB — HIV ANTIBODY (ROUTINE TESTING W REFLEX): HIV Screen 4th Generation wRfx: NONREACTIVE

## 2018-04-29 LAB — MRSA PCR SCREENING: MRSA by PCR: NEGATIVE

## 2018-04-29 SURGERY — OPEN REDUCTION INTERNAL FIXATION (ORIF) ANKLE FRACTURE
Anesthesia: Regional | Site: Ankle | Laterality: Left

## 2018-04-29 MED ORDER — ONDANSETRON HCL 4 MG PO TABS
4.0000 mg | ORAL_TABLET | Freq: Four times a day (QID) | ORAL | Status: DC | PRN
  Administered 2018-04-30: 4 mg via ORAL
  Filled 2018-04-29: qty 1

## 2018-04-29 MED ORDER — ACETAMINOPHEN 325 MG PO TABS
325.0000 mg | ORAL_TABLET | Freq: Four times a day (QID) | ORAL | Status: DC | PRN
  Administered 2018-04-30 – 2018-05-01 (×2): 650 mg via ORAL
  Filled 2018-04-29 (×2): qty 2

## 2018-04-29 MED ORDER — METHOCARBAMOL 500 MG PO TABS
ORAL_TABLET | ORAL | Status: AC
  Filled 2018-04-29: qty 1

## 2018-04-29 MED ORDER — MEPERIDINE HCL 50 MG/ML IJ SOLN: 6.2500 mg | INTRAMUSCULAR | Status: DC | PRN

## 2018-04-29 MED ORDER — ONDANSETRON HCL 4 MG/2ML IJ SOLN
INTRAMUSCULAR | Status: DC | PRN
  Administered 2018-04-29: 4 mg via INTRAVENOUS

## 2018-04-29 MED ORDER — HYDROMORPHONE HCL 1 MG/ML IJ SOLN
0.2500 mg | INTRAMUSCULAR | Status: DC | PRN
  Administered 2018-04-29 (×2): 0.5 mg via INTRAVENOUS

## 2018-04-29 MED ORDER — CEFAZOLIN SODIUM-DEXTROSE 1-4 GM/50ML-% IV SOLN
1.0000 g | Freq: Four times a day (QID) | INTRAVENOUS | Status: AC
  Administered 2018-04-29 – 2018-04-30 (×3): 1 g via INTRAVENOUS
  Filled 2018-04-29 (×3): qty 50

## 2018-04-29 MED ORDER — EPHEDRINE 5 MG/ML INJ
INTRAVENOUS | Status: AC
  Filled 2018-04-29: qty 10

## 2018-04-29 MED ORDER — HYDROCODONE-ACETAMINOPHEN 7.5-325 MG PO TABS: 1.0000 | ORAL_TABLET | Freq: Once | ORAL | Status: DC | PRN

## 2018-04-29 MED ORDER — CEFAZOLIN SODIUM-DEXTROSE 2-4 GM/100ML-% IV SOLN: 2.0000 g | INTRAVENOUS | Status: DC

## 2018-04-29 MED ORDER — PROPOFOL 10 MG/ML IV BOLUS
INTRAVENOUS | Status: AC
  Filled 2018-04-29: qty 20

## 2018-04-29 MED ORDER — PROPOFOL 10 MG/ML IV BOLUS
INTRAVENOUS | Status: DC | PRN
  Administered 2018-04-29: 200 mg via INTRAVENOUS

## 2018-04-29 MED ORDER — MAGNESIUM CITRATE PO SOLN: 1.0000 | Freq: Once | ORAL | Status: DC | PRN

## 2018-04-29 MED ORDER — HYDROMORPHONE HCL 1 MG/ML IJ SOLN: 0.5000 mg | INTRAMUSCULAR | Status: DC | PRN

## 2018-04-29 MED ORDER — LACTATED RINGERS IV SOLN
INTRAVENOUS | Status: DC
  Administered 2018-04-29 (×2): via INTRAVENOUS

## 2018-04-29 MED ORDER — PROMETHAZINE HCL 25 MG/ML IJ SOLN: 6.2500 mg | INTRAMUSCULAR | Status: DC | PRN

## 2018-04-29 MED ORDER — FENTANYL CITRATE (PF) 100 MCG/2ML IJ SOLN
100.0000 ug | Freq: Once | INTRAMUSCULAR | Status: AC
  Administered 2018-04-29: 100 ug via INTRAVENOUS

## 2018-04-29 MED ORDER — METOCLOPRAMIDE HCL 5 MG/ML IJ SOLN: 5.0000 mg | Freq: Three times a day (TID) | INTRAMUSCULAR | Status: DC | PRN

## 2018-04-29 MED ORDER — LIDOCAINE HCL (CARDIAC) PF 100 MG/5ML IV SOSY
PREFILLED_SYRINGE | INTRAVENOUS | Status: DC | PRN
  Administered 2018-04-29: 100 mg via INTRAVENOUS

## 2018-04-29 MED ORDER — ROPIVACAINE HCL 5 MG/ML IJ SOLN
INTRAMUSCULAR | Status: DC | PRN
  Administered 2018-04-29: 30 mL via PERINEURAL

## 2018-04-29 MED ORDER — CHLORHEXIDINE GLUCONATE 4 % EX LIQD: 60.0000 mL | Freq: Once | CUTANEOUS | Status: AC

## 2018-04-29 MED ORDER — METHOCARBAMOL 1000 MG/10ML IJ SOLN
500.0000 mg | Freq: Four times a day (QID) | INTRAVENOUS | Status: DC | PRN
  Filled 2018-04-29: qty 5

## 2018-04-29 MED ORDER — POLYETHYLENE GLYCOL 3350 17 G PO PACK: 17.0000 g | PACK | Freq: Every day | ORAL | Status: DC | PRN

## 2018-04-29 MED ORDER — ONDANSETRON HCL 4 MG/2ML IJ SOLN: 4.0000 mg | Freq: Four times a day (QID) | INTRAMUSCULAR | Status: DC | PRN

## 2018-04-29 MED ORDER — DOCUSATE SODIUM 100 MG PO CAPS
100.0000 mg | ORAL_CAPSULE | Freq: Two times a day (BID) | ORAL | Status: DC
  Administered 2018-04-29 – 2018-05-01 (×4): 100 mg via ORAL
  Filled 2018-04-29 (×4): qty 1

## 2018-04-29 MED ORDER — HYDROMORPHONE HCL 1 MG/ML IJ SOLN
INTRAMUSCULAR | Status: AC
  Filled 2018-04-29: qty 1

## 2018-04-29 MED ORDER — FENTANYL CITRATE (PF) 250 MCG/5ML IJ SOLN
INTRAMUSCULAR | Status: AC
  Filled 2018-04-29: qty 5

## 2018-04-29 MED ORDER — MIDAZOLAM HCL 2 MG/2ML IJ SOLN
INTRAMUSCULAR | Status: AC
  Administered 2018-04-29: 2 mg via INTRAVENOUS
  Filled 2018-04-29: qty 2

## 2018-04-29 MED ORDER — MIDAZOLAM HCL 2 MG/2ML IJ SOLN
2.0000 mg | Freq: Once | INTRAMUSCULAR | Status: AC
  Administered 2018-04-29: 2 mg via INTRAVENOUS

## 2018-04-29 MED ORDER — FENTANYL CITRATE (PF) 100 MCG/2ML IJ SOLN
INTRAMUSCULAR | Status: DC | PRN
  Administered 2018-04-29: 50 ug via INTRAVENOUS

## 2018-04-29 MED ORDER — OXYCODONE HCL 5 MG PO TABS
ORAL_TABLET | ORAL | Status: AC
  Filled 2018-04-29: qty 2

## 2018-04-29 MED ORDER — OXYCODONE HCL 5 MG PO TABS
5.0000 mg | ORAL_TABLET | ORAL | Status: DC | PRN
  Administered 2018-04-29: 10 mg via ORAL

## 2018-04-29 MED ORDER — BISACODYL 10 MG RE SUPP: 10.0000 mg | Freq: Every day | RECTAL | Status: DC | PRN

## 2018-04-29 MED ORDER — ONDANSETRON HCL 4 MG/2ML IJ SOLN
INTRAMUSCULAR | Status: AC
  Filled 2018-04-29: qty 4

## 2018-04-29 MED ORDER — PHENYLEPHRINE 40 MCG/ML (10ML) SYRINGE FOR IV PUSH (FOR BLOOD PRESSURE SUPPORT)
PREFILLED_SYRINGE | INTRAVENOUS | Status: AC
  Filled 2018-04-29: qty 20

## 2018-04-29 MED ORDER — METHOCARBAMOL 500 MG PO TABS
500.0000 mg | ORAL_TABLET | Freq: Four times a day (QID) | ORAL | Status: DC | PRN
  Administered 2018-04-29: 500 mg via ORAL

## 2018-04-29 MED ORDER — FENTANYL CITRATE (PF) 100 MCG/2ML IJ SOLN
INTRAMUSCULAR | Status: AC
  Administered 2018-04-29: 100 ug via INTRAVENOUS
  Filled 2018-04-29: qty 2

## 2018-04-29 MED ORDER — SODIUM CHLORIDE 0.9 % IV SOLN
INTRAVENOUS | Status: DC
  Administered 2018-04-29: 20:00:00 via INTRAVENOUS

## 2018-04-29 MED ORDER — ACETAMINOPHEN 10 MG/ML IV SOLN: 1000.0000 mg | Freq: Once | INTRAVENOUS | Status: DC | PRN

## 2018-04-29 MED ORDER — METOCLOPRAMIDE HCL 5 MG PO TABS: 5.0000 mg | ORAL_TABLET | Freq: Three times a day (TID) | ORAL | Status: DC | PRN

## 2018-04-29 MED ORDER — OXYCODONE HCL 5 MG PO TABS
10.0000 mg | ORAL_TABLET | ORAL | Status: DC | PRN
  Administered 2018-04-29 – 2018-04-30 (×5): 15 mg via ORAL
  Filled 2018-04-29 (×5): qty 3

## 2018-04-29 SURGICAL SUPPLY — 62 items
BAG DECANTER FOR FLEXI CONT (MISCELLANEOUS) IMPLANT
BANDAGE ACE 4X5 VEL STRL LF (GAUZE/BANDAGES/DRESSINGS) ×3 IMPLANT
BANDAGE ACE 6X5 VEL STRL LF (GAUZE/BANDAGES/DRESSINGS) ×3 IMPLANT
BANDAGE ESMARK 6X9 LF (GAUZE/BANDAGES/DRESSINGS) IMPLANT
BIT DRILL ISTAL 2.5 (BIT) ×2 IMPLANT
BNDG CMPR 9X4 STRL LF SNTH (GAUZE/BANDAGES/DRESSINGS) ×1
BNDG CMPR 9X6 STRL LF SNTH (GAUZE/BANDAGES/DRESSINGS)
BNDG COHESIVE 4X5 TAN STRL (GAUZE/BANDAGES/DRESSINGS) ×3 IMPLANT
BNDG ESMARK 4X9 LF (GAUZE/BANDAGES/DRESSINGS) ×2 IMPLANT
BNDG ESMARK 6X9 LF (GAUZE/BANDAGES/DRESSINGS)
BNDG GAUZE ELAST 4 BULKY (GAUZE/BANDAGES/DRESSINGS) ×3 IMPLANT
CLAMP COMBO MED CLIP-ON SELF (Clamp) ×4 IMPLANT
COVER SURGICAL LIGHT HANDLE (MISCELLANEOUS) ×3 IMPLANT
COVER WAND RF STERILE (DRAPES) ×3 IMPLANT
DRAPE HALF SHEET 40X57 (DRAPES) ×6 IMPLANT
DRAPE INCISE IOBAN 66X45 STRL (DRAPES) IMPLANT
DRAPE OEC MINIVIEW 54X84 (DRAPES) IMPLANT
DRAPE U-SHAPE 47X51 STRL (DRAPES) ×3 IMPLANT
DRSG ADAPTIC 3X8 NADH LF (GAUZE/BANDAGES/DRESSINGS) ×3 IMPLANT
DRSG PAD ABDOMINAL 8X10 ST (GAUZE/BANDAGES/DRESSINGS) ×3 IMPLANT
DURAPREP 26ML APPLICATOR (WOUND CARE) ×3 IMPLANT
ELECT REM PT RETURN 9FT ADLT (ELECTROSURGICAL) ×3
ELECTRODE REM PT RTRN 9FT ADLT (ELECTROSURGICAL) ×1 IMPLANT
GAUZE SPONGE 4X4 12PLY STRL (GAUZE/BANDAGES/DRESSINGS) ×3 IMPLANT
GLOVE BIOGEL PI IND STRL 9 (GLOVE) ×1 IMPLANT
GLOVE BIOGEL PI INDICATOR 9 (GLOVE) ×2
GLOVE SURG ORTHO 9.0 STRL STRW (GLOVE) ×3 IMPLANT
GOWN STRL REUS W/ TWL XL LVL3 (GOWN DISPOSABLE) ×3 IMPLANT
GOWN STRL REUS W/TWL XL LVL3 (GOWN DISPOSABLE) ×9
KIT BASIN OR (CUSTOM PROCEDURE TRAY) ×3 IMPLANT
KIT TURNOVER KIT B (KITS) ×3 IMPLANT
MANIFOLD NEPTUNE II (INSTRUMENTS) ×3 IMPLANT
NS IRRIG 1000ML POUR BTL (IV SOLUTION) ×3 IMPLANT
PACK ORTHO EXTREMITY (CUSTOM PROCEDURE TRAY) ×3 IMPLANT
PAD ARMBOARD 7.5X6 YLW CONV (MISCELLANEOUS) ×6 IMPLANT
PAD CAST 4YDX4 CTTN HI CHSV (CAST SUPPLIES) ×1 IMPLANT
PADDING CAST COTTON 4X4 STRL (CAST SUPPLIES) ×3
PENCIL BUTTON HOLSTER BLD 10FT (ELECTRODE) ×3 IMPLANT
PLATE MESH TI SHORT STERILE (Plate) ×2 IMPLANT
ROD CARBON FIBER 8.0X160MM (Rod) ×2 IMPLANT
SCREW CORT LP HYBRID 3X26 (Screw) ×4 IMPLANT
SCREW CORT LP HYBRID 3X30 (Screw) ×2 IMPLANT
SCREW SHANZ 4.0X80MM (EXFIX) ×4 IMPLANT
STAPLER VISISTAT 35W (STAPLE) IMPLANT
STOCKINETTE 6  STRL (DRAPES) ×2
STOCKINETTE 6 STRL (DRAPES) ×1 IMPLANT
STOCKINETTE TUBULAR SYNTH 3IN (CAST SUPPLIES) ×2 IMPLANT
SUCTION FRAZIER HANDLE 10FR (MISCELLANEOUS) ×2
SUCTION TUBE FRAZIER 10FR DISP (MISCELLANEOUS) ×1 IMPLANT
SUT ETHILON 2 0 FS 18 (SUTURE) ×6 IMPLANT
SUT ETHILON 2 0 PSLX (SUTURE) IMPLANT
SUT ETHILON 3 0 FSLX (SUTURE) ×3 IMPLANT
SUT ETHILON 4 0 PS 2 18 (SUTURE) ×2 IMPLANT
SUT VIC AB 2-0 CT1 27 (SUTURE) ×3
SUT VIC AB 2-0 CT1 TAPERPNT 27 (SUTURE) ×1 IMPLANT
SUT VIC AB 2-0 CTB1 (SUTURE) ×3 IMPLANT
TOWEL OR 17X24 6PK STRL BLUE (TOWEL DISPOSABLE) ×3 IMPLANT
TOWEL OR 17X26 10 PK STRL BLUE (TOWEL DISPOSABLE) ×3 IMPLANT
TUBE CONNECTING 12'X1/4 (SUCTIONS) ×1
TUBE CONNECTING 12X1/4 (SUCTIONS) ×2 IMPLANT
WATER STERILE IRR 1000ML POUR (IV SOLUTION) ×3 IMPLANT
YANKAUER SUCT BULB TIP NO VENT (SUCTIONS) IMPLANT

## 2018-04-29 NOTE — Plan of Care (Signed)

## 2018-04-29 NOTE — Anesthesia Procedure Notes (Signed)
Procedure Name: LMA Insertion Date/Time: 04/29/2018 3:02 PM Performed by: Haleigh Desmith T, CRNA Pre-anesthesia Checklist: Patient identified, Emergency Drugs available, Suction available and Patient being monitored Patient Re-evaluated:Patient Re-evaluated prior to induction Oxygen Delivery Method: Circle system utilized Preoxygenation: Pre-oxygenation with 100% oxygen Induction Type: IV induction LMA: LMA inserted LMA Size: 4.0 Tube type: Oral Number of attempts: 1 Airway Equipment and Method: Patient positioned with wedge pillow Placement Confirmation: positive ETCO2 and breath sounds checked- equal and bilateral Tube secured with: Tape Dental Injury: Teeth and Oropharynx as per pre-operative assessment

## 2018-04-29 NOTE — Anesthesia Postprocedure Evaluation (Signed)
Anesthesia Post Note  [name]  Procedure(s) Performed: OPEN REDUCTION INTERNAL FIXATION (ORIF) LEFT NAVICULAR AND EXTERNAL FIXATION (Left Ankle) EXTERNAL FIXATION LEG (Left Ankle)     Patient location during evaluation: PACU Anesthesia Type: Regional and General Level of consciousness: awake and alert Pain management: pain level controlled Vital Signs Assessment: post-procedure vital signs reviewed and stable Respiratory status: spontaneous breathing, nonlabored ventilation, respiratory function stable and patient connected to nasal cannula oxygen Cardiovascular status: blood pressure returned to baseline and stable Postop Assessment: no apparent nausea or vomiting Anesthetic complications: no    Last Vitals:  Vitals:   04/29/18 1715 04/29/18 1726  BP: 109/73 106/72  Pulse: (!) 57 62  Resp: 16 16  Temp: 36.5 C 36.8 C  SpO2: 99% 100%    Last Pain:  Vitals:   04/29/18 1726  TempSrc: Oral  PainSc: 5                  Trevor IhaStephen A Kurtis Anastasia

## 2018-04-29 NOTE — Interval H&P Note (Signed)
History and Physical Interval Note:  04/29/2018 6:45 AM  Aaron Fry  has presented today for surgery, with the diagnosis of left talus fracture/dislocation  The various methods of treatment have been discussed with the patient and family. After consideration of risks, benefits and other options for treatment, the patient has consented to  Procedure(s): OPEN REDUCTION INTERNAL FIXATION (ORIF) LEFT NAVICULAR AND EXTERNAL FIXATION (Left) EXTERNAL FIXATION LEG (Left) as a surgical intervention .  The patient's history has been reviewed, patient examined, no change in status, stable for surgery.  I have reviewed the patient's chart and labs.  Questions were answered to the patient's satisfaction.     Nadara MustardMarcus V Duda

## 2018-04-29 NOTE — Anesthesia Preprocedure Evaluation (Addendum)
Anesthesia Evaluation  Patient identified by MRN, date of birth, ID band Patient awake    Reviewed: Allergy & Precautions, H&P , NPO status , Patient's Chart, lab work & pertinent test results  Airway Mallampati: II  TM Distance: >3 FB Neck ROM: Full    Dental no notable dental hx. (+) Teeth Intact, Dental Advisory Given   Pulmonary Current Smoker,    Pulmonary exam normal breath sounds clear to auscultation       Cardiovascular Exercise Tolerance: Good negative cardio ROS Normal cardiovascular exam Rhythm:Regular Rate:Normal     Neuro/Psych negative neurological ROS  negative psych ROS   GI/Hepatic negative GI ROS,   Endo/Other  negative endocrine ROS  Renal/GU negative Renal ROS     Musculoskeletal negative musculoskeletal ROS (+)   Abdominal   Peds  Hematology negative hematology ROS (+)   Anesthesia Other Findings   Reproductive/Obstetrics                           Anesthesia Physical Anesthesia Plan  ASA: II  Anesthesia Plan: Regional   Post-op Pain Management:    Induction: Intravenous  PONV Risk Score and Plan: 2 and Treatment may vary due to age or medical condition, Ondansetron and Dexamethasone  Airway Management Planned: LMA  Additional Equipment:   Intra-op Plan:   Post-operative Plan:   Informed Consent: I have reviewed the patients History and Physical, chart, labs and discussed the procedure including the risks, benefits and alternatives for the proposed anesthesia with the patient or authorized representative who has indicated his/her understanding and acceptance.   Dental advisory given  Plan Discussed with:   Anesthesia Plan Comments:        Anesthesia Quick Evaluation

## 2018-04-29 NOTE — Op Note (Signed)
04/29/2018  4:31 PM  PATIENT:  Aaron Fry    PRE-OPERATIVE DIAGNOSIS:  left talus fracture/dislocation  POST-OPERATIVE DIAGNOSIS:  Same  PROCEDURE:  OPEN REDUCTION INTERNAL FIXATION (ORIF) LEFT NAVICULAR AND EXTERNAL FIXATION, EXTERNAL FIXATION LEG  SURGEON:  Nadara MustardMarcus V Duda, MD  PHYSICIAN ASSISTANT:None ANESTHESIA:   General  PREOPERATIVE INDICATIONS:  Aaron Fry is a  27 y.o. male with a diagnosis of left talus fracture/dislocation who failed conservative measures and elected for surgical management.    The risks benefits and alternatives were discussed with the patient preoperatively including but not limited to the risks of infection, bleeding, nerve injury, cardiopulmonary complications, the need for revision surgery, among others, and the patient was willing to proceed.  OPERATIVE IMPLANTS: Arthrex navicular plate and Synthes external fixator  @ENCIMAGES @  OPERATIVE FINDINGS: Comminution of the navicular with dorsal dislocation  OPERATIVE PROCEDURE: Patient was brought the operating room after undergoing a popliteal block he then underwent a general anesthetic.  After adequate levels of anesthesia were obtained patient's left lower extremity was prepped using DuraPrep draped in the sterile field a timeout was called.  A dorsal incision was made between the anterior tibial tendon and the EHL and this interval was developed down to the navicular.  Subperiosteal dissection was used to debride down to the talonavicular joint.  Fevers tissue was removed the joint was reduced.  A medium pin was then placed into the talus as well as the base of the first metatarsal the talus was reduced brought out to length and the external fixator was secured to stabilize the talonavicular joint.  A Arthrex plate was then contoured and secured to the talus to stabilize the talar comminuted fracture.  C-arm fluoroscopy verified alignment in both AP lateral and oblique planes and the alignment was  stable.  The wound was irrigated with normal saline.  Esmarch was released from the ankle and hemostasis was obtained.  The incision was closed using 2-0 nylon and a sterile dressing was applied.  Patient was extubated taken to PACU in stable condition.   DISCHARGE PLANNING:  Antibiotic duration: 24-hour IV antibiotics  Weightbearing: Nonweightbearing on the left  Pain medication: Opioid pathway ordered  Dressing care/ Wound VAC: Dry dressing change in 1 week  Ambulatory devices: Walker or crutches  Discharge to: Home  Follow-up: In the office 1 week post operative.

## 2018-04-29 NOTE — Transfer of Care (Signed)
Immediate Anesthesia Transfer of Care Note  [name]  Procedure(s) Performed: OPEN REDUCTION INTERNAL FIXATION (ORIF) LEFT NAVICULAR AND EXTERNAL FIXATION (Left Ankle) EXTERNAL FIXATION LEG (Left Ankle)  Patient Location: PACU  Anesthesia Type:GA combined with regional for post-op pain  Level of Consciousness: awake, alert  and oriented  Airway & Oxygen Therapy: Patient Spontanous Breathing and Patient connected to nasal cannula oxygen  Post-op Assessment: Report given to RN, Post -op Vital signs reviewed and stable and Patient moving all extremities  Post vital signs: Reviewed and stable  Last Vitals:  Vitals Value Taken Time  BP    Temp    Pulse    Resp    SpO2      Last Pain:  Vitals:   04/29/18 1228  TempSrc: Oral  PainSc:          Complications: No apparent anesthesia complications

## 2018-04-29 NOTE — Anesthesia Procedure Notes (Addendum)
Anesthesia Regional Block: Popliteal block   Pre-Anesthetic Checklist: ,, timeout performed, Correct Patient, Correct Site, Correct Laterality, Correct Procedure, Correct Position, site marked, Risks and benefits discussed, pre-op evaluation,  At surgeon's request and post-op pain management  Laterality: Left  Prep: Maximum Sterile Barrier Precautions used, chloraprep       Needles:  Injection technique: Single-shot  Needle Type: Echogenic Needle     Needle Length: 9cm  Needle Gauge: 21     Additional Needles:   Procedures:,,,, ultrasound used (permanent image in chart),,,,  Narrative:  Start time: 04/29/2018 2:30 PM End time: 04/29/2018 2:43 PM Injection made incrementally with aspirations every 5 mL. Anesthesiologist: Trevor IhaHouser, Wajiha Versteeg A, MD  Additional Notes: Pt tolerated procedure well

## 2018-04-29 NOTE — Anesthesia Procedure Notes (Deleted)
Anesthesia Procedure Note     

## 2018-04-30 NOTE — Evaluation (Addendum)
Physical Therapy Evaluation Patient Details Name: Aaron Fry MRN: [id number] DOB: 10/27/1990 Today's Date: Fry   History of Present Illness  Aaron Fry is a 27 y.o. male who presents with a dislocated talonavicular joint with segmental fracture of the navicular with minimally displaced fracture of the cuboid.  Patient states he was on a ladder one floor off the ground when he fell from the ladder.  Clinical Impression  Patient presents with decreased independence with mobility due to pain, and NWB on L LE.  Feel he will benefit from skilled PT in the acute setting to allow d/c home with family support.  Likely no follow up PT needs until able to mobilize ankle and progress with weight bearing. Will follow up acutely to progress ambulation trial with crutches and practice stairs for home entry.    Follow Up Recommendations Supervision/Assistance - 24 hour;No PT follow up    Equipment Recommendations  Rolling walker with 5" wheels;3in1 (PT)    Recommendations for Other Services       Precautions / Restrictions Precautions Precautions: Fall Required Braces or Orthoses: Other Brace/Splint Other Brace/Splint: post op boot Restrictions Weight Bearing Restrictions: Yes LLE Weight Bearing: Non weight bearing      Mobility  Bed Mobility Overal bed mobility: Modified Independent                Transfers Overall transfer level: Needs assistance Equipment used: Rolling walker (2 wheeled) Transfers: Sit to/from Stand Sit to Stand: Min guard         General transfer comment: for balance, cues for hand placement, but pt still pulls up on RW  Ambulation/Gait Ambulation/Gait assistance: Min guard Gait Distance (Feet): 130 Feet Assistive device: Rolling walker (2 wheeled) Gait Pattern/deviations: Step-to pattern     General Gait Details: maintains NWB, however, cues for safety with walker as stepping too far forward and at times walker rolling while  stepping  Stairs            Wheelchair Mobility    Modified Rankin (Stroke Patients Only)       Balance Overall balance assessment: Needs assistance   Sitting balance-Leahy Scale: Good     Standing balance support: Bilateral upper extremity supported;Single extremity supported;During functional activity Standing balance-Leahy Scale: Poor Standing balance comment: standing to urinate holding grabbar                             Pertinent Vitals/Pain Pain Assessment: 0-10 Pain Score: 10-Worst pain ever Pain Location: with ambulation Pain Descriptors / Indicators: Throbbing;Sore;Aching Pain Intervention(s): Repositioned;Monitored during session;Limited activity within patient's tolerance;Patient requesting pain meds-RN notified    Home Living Family/patient expects to be discharged to:: Private residence Living Arrangements: Parent(mother ) Available Help at Discharge: Available 24 hours/day;Family Type of Home: House Home Access: Stairs to enter   Secretary/administrator of Steps: 4 Home Layout: One level Home Equipment: None      Prior Function Level of Independence: Independent               Hand Dominance        Extremity/Trunk Assessment   Upper Extremity Assessment Upper Extremity Assessment: Overall WFL for tasks assessed    Lower Extremity Assessment Lower Extremity Assessment: LLE deficits/detail LLE Deficits / Details: wrapped with coban over ex-fix; able to lift unaided and wiggles toes well, ankle positioned in planterflexion       Communication   Communication: No difficulties  Cognition  Arousal/Alertness: Awake/alert Behavior During Therapy: WFL for tasks assessed/performed Overall Cognitive Status: Within Functional Limits for tasks assessed                                        General Comments General comments (skin integrity, edema, etc.): educated on foot elevation at rest and toe wiggles     Exercises     Assessment/Plan    PT Assessment Patient needs continued PT services  PT Problem List Decreased range of motion;Decreased balance;Pain;Decreased knowledge of use of DME;Decreased safety awareness;Decreased knowledge of precautions;Decreased activity tolerance       PT Treatment Interventions DME instruction;Therapeutic activities;Gait training;Therapeutic exercise;Patient/family education;Stair training;Balance training;Functional mobility training    PT Goals (Current goals can be found in the Care Plan section)  Acute Rehab PT Goals Patient Stated Goal: to go home PT Goal Formulation: With patient Time For Goal Achievement: 05/02/18 Potential to Achieve Goals: Good    Frequency Min 5X/week   Barriers to discharge        Co-evaluation               AM-PAC PT "6 Clicks" Daily Activity  Outcome Measure Difficulty turning over in bed (including adjusting bedclothes, sheets and blankets)?: A Little Difficulty moving from lying on back to sitting on the side of the bed? : A Little Difficulty sitting down on and standing up from a chair with arms (e.g., wheelchair, bedside commode, etc,.)?: Unable Help needed moving to and from a bed to chair (including a wheelchair)?: A Little Help needed walking in hospital room?: A Little Help needed climbing 3-5 steps with a railing? : A Lot 6 Click Score: 15    End of Session Equipment Utilized During Treatment: Gait belt Activity Tolerance: Patient limited by pain Patient left: in bed;with call bell/phone within reach Nurse Communication: Mobility status PT Visit Diagnosis: Pain;Difficulty in walking, not elsewhere classified (R26.2) Pain - Right/Left: Left Pain - part of body: Ankle and joints of foot    Time: 0905-0930 PT Time Calculation (min) (ACUTE ONLY): 25 min   Charges:   PT Evaluation $PT Eval Low Complexity: 1 Low PT Treatments $Gait Training: 8-22 mins        Aaron Fry, Aaron Fry   Aaron Fry Fry, 12:01 PM

## 2018-04-30 NOTE — Progress Notes (Signed)
Patient ID: Aaron Fry, male   DOB: 08/10/1990, 27 y.o.   MRN: [id number] Postoperative day 1 open reduction internal fixation comminuted navicular fracture left foot with reduction of the talonavicular joint with internal and external fixation.  Patient states that his foot is a little sore.  The dressing is clean and dry.  Discussed the importance of elevation strict nonweightbearing and the importance of smoking cessation again.  Plan for discharge tomorrow.

## 2018-04-30 NOTE — Progress Notes (Signed)
Orthopedic Tech Progress Note Patient Details:  Aaron Fry 02/06/1991 [id number]  Ortho Devices Type of Ortho Device: Prafo boot/shoe Ortho Device/Splint Interventions: Application   Post Interventions Patient Tolerated: Well Instructions Provided: Care of device   Saul FordyceJennifer C Shamecka Hocutt 04/30/2018, 10:38 AM

## 2018-05-01 MED ORDER — DOCUSATE SODIUM 100 MG PO CAPS: 100.0000 mg | ORAL_CAPSULE | Freq: Two times a day (BID) | ORAL | 0 refills | Status: DC

## 2018-05-01 MED ORDER — METHOCARBAMOL 500 MG PO TABS: 500.0000 mg | ORAL_TABLET | Freq: Four times a day (QID) | ORAL | 1 refills | Status: DC | PRN

## 2018-05-01 MED ORDER — BISACODYL 10 MG RE SUPP: 10.0000 mg | Freq: Every day | RECTAL | 0 refills | Status: DC | PRN

## 2018-05-01 MED ORDER — OXYCODONE HCL 5 MG PO TABS: 5.0000 mg | ORAL_TABLET | ORAL | 0 refills | Status: DC | PRN

## 2018-05-01 MED ORDER — ACETAMINOPHEN 325 MG PO TABS: 325.0000 mg | ORAL_TABLET | Freq: Four times a day (QID) | ORAL | Status: DC | PRN

## 2018-05-01 NOTE — Plan of Care (Signed)
  Problem: Education: Goal: Knowledge of General Education information will improve Description: Including pain rating scale, medication(s)/side effects and non-pharmacologic comfort measures Outcome: Progressing   Problem: Health Behavior/Discharge Planning: Goal: Ability to manage health-related needs will improve Outcome: Progressing   Problem: Clinical Measurements: Goal: Ability to maintain clinical measurements within normal limits will improve Outcome: Progressing Goal: Will remain free from infection Outcome: Progressing Goal: Diagnostic test results will improve Outcome: Progressing Goal: Respiratory complications will improve Outcome: Progressing   Problem: Elimination: Goal: Will not experience complications related to urinary retention Outcome: Progressing   Problem: Pain Managment: Goal: General experience of comfort will improve Outcome: Progressing   

## 2018-05-01 NOTE — Progress Notes (Signed)
Patient ID: Aaron Fry, male   DOB: 08/21/1990, 27 y.o.   MRN: [id number] POD #2 S/P ORIF comminuted navicular fracture left foot with reduction of talonavicular joint with internal and external fixation. Reports he can wiggle toes and feel toes better this am. Felt more stable with rolling walker and should be ready for DC with Rolling walker and HH PT and face to face completed.   Will be strict non weight bearing on the left foot and should not remove dressings. Will see in office in 1 week.  Prescription for Percocet placed on paper chart.   [employer]URN,[name],PA-C [facility] [phone]

## 2018-05-01 NOTE — Care Management Note (Signed)
Case Management Note  Patient Details  Name: Aaron Fry MRN: [id number] Date of Birth: 04/28/1991  Subjective/Objective:                     Action/Plan:   Expected Discharge Date:  05/01/18               Expected Discharge Plan:  Home/Self Care  In-House Referral:  NA  Discharge planning Services  CM Consult  Post Acute Care Choice:  Durable Medical Equipment Choice offered to:  NA  DME Arranged:  3-N-1, Walker rolling DME Agency:  [facility]  HH Arranged:  NA HH Agency:  NA  Status of Service:  Completed, signed off  If discussed at Long Length of Stay Meetings, dates discussed:    Additional Comments:  Durenda GuthrieBrady, Tatum Massman Naomi, RN 05/01/2018, 12:04 PM

## 2018-05-01 NOTE — Progress Notes (Signed)
Physical Therapy Treatment Patient Details Name: Aaron Fry MRN: [id number] DOB: September 19, 1990 Today's Date: 05/01/2018    History of Present Illness Aaron Fry is a 27 y.o. male who presents with a dislocated talonavicular joint with segmental fracture of the navicular with minimally displaced fracture of the cuboid.  Patient states he was on a ladder one floor off the ground when he fell from the ladder.    PT Comments    Patient seen for mobility progression. Pt is tolerated gait and stair training well with min guard for safety. Pt prefers to use RW and declined crutch training. [name]. Current plan remains appropriate.    Follow Up Recommendations  Supervision/Assistance - 24 hour;No PT follow up     Equipment Recommendations  Rolling walker with 5" wheels;3in1 (PT)    Recommendations for Other Services       Precautions / Restrictions Precautions Precautions: Fall Required Braces or Orthoses: Other Brace/Splint Other Brace/Splint: post op boot Restrictions Weight Bearing Restrictions: Yes LLE Weight Bearing: Non weight bearing    Mobility  Bed Mobility Overal bed mobility: Modified Independent                Transfers Overall transfer level: Needs assistance Equipment used: Rolling walker (2 wheeled) Transfers: Sit to/from Stand Sit to Stand: Supervision         General transfer comment: supervision for safety; cues for safe use hand placement  Ambulation/Gait Ambulation/Gait assistance: Min guard Gait Distance (Feet): 160 Feet Assistive device: Rolling walker (2 wheeled) Gait Pattern/deviations: Step-to pattern     General Gait Details: cues for safe proximity to RW, step length, and posture; [name]    Stairs Stairs: Yes Stairs assistance: Min guard Stair Management: Two rails;Step to pattern;Forwards Number of Stairs: (2 steps X 2 trials) General stair comments: cues for safety     Wheelchair Mobility    Modified Rankin (Stroke Patients Only)       Balance Overall balance assessment: Needs assistance   Sitting balance-Leahy Scale: Good     Standing balance support: Bilateral upper extremity supported;Single extremity supported;During functional activity Standing balance-Leahy Scale: Poor Standing balance comment: pt is able to static stand with single UE support                            Cognition Arousal/Alertness: Awake/alert Behavior During Therapy: WFL for tasks assessed/performed Overall Cognitive Status: Within Functional Limits for tasks assessed                                        Exercises      General Comments        Pertinent Vitals/Pain Pain Assessment: 0-10 Pain Score: 6  Pain Location: L ankle in dependent position Pain Descriptors / Indicators: Throbbing;Sore;Aching Pain Intervention(s): Limited activity within patient's tolerance;Monitored during session;Repositioned    Home Living                      Prior Function            PT Goals (current goals can now be found in the care plan section) Acute Rehab PT Goals Patient Stated Goal: to go home Progress towards PT goals: Progressing toward goals    Frequency    Min 5X/week  PT Plan Current plan remains appropriate    Co-evaluation              AM-PAC PT "6 Clicks" Daily Activity  Outcome Measure  Difficulty turning over in bed (including adjusting bedclothes, sheets and blankets)?: A Little Difficulty moving from lying on back to sitting on the side of the bed? : A Little Difficulty sitting down on and standing up from a chair with arms (e.g., wheelchair, bedside commode, etc,.)?: Unable Help needed moving to and from a bed to chair (including a wheelchair)?: A Little Help needed walking in hospital room?: A Little Help needed climbing 3-5 steps with a railing? : A Little 6 Click Score: 16    End of  Session Equipment Utilized During Treatment: Gait belt Activity Tolerance: Patient tolerated treatment well Patient left: in bed;with call bell/phone within reach Nurse Communication: Mobility status PT Visit Diagnosis: Pain;Difficulty in walking, not elsewhere classified (R26.2) Pain - Right/Left: Left Pain - part of body: Ankle and joints of foot     Time: [id number] PT Time Calculation (min) (ACUTE ONLY): 20 min  Charges:  $Gait Training: 8-22 mins                     Erline LevineKellyn Idan Prime, PTA Acute Rehabilitation Services Pager: [phone] Office: [phone]     [name] 05/01/2018, 8:57 AM

## 2018-05-01 NOTE — Plan of Care (Signed)

## 2018-05-01 NOTE — Discharge Instructions (Signed)
Do not put any weight on the left foot.  Elevate the left foot as much as possible higher than the level of your heart.  Do not remove the dressings on the left foot.  Take a aspirin once daily to prevent blood clots

## 2018-05-01 NOTE — Discharge Summary (Signed)
Discharge Diagnoses:  Active Problems:   Displaced fracture of cuboid bone of left foot, initial encounter for closed fracture   Multiple closed fractures of left foot   Displaced fracture of navicular (scaphoid) of right foot, initial encounter for closed fracture   Tobacco abuse counseling   Surgeries: Procedure(s): OPEN REDUCTION INTERNAL FIXATION (ORIF) LEFT NAVICULAR AND EXTERNAL FIXATION EXTERNAL FIXATION LEG on 04/29/2018    Consultants:   Discharged Condition: Improved  Hospital Course: Aaron Fry is an 27 y.o. male who was admitted 04/28/2018 with a chief complaint of left foot dislocated talonavicular joint with segmental fracture of the cuboid , with a final diagnosis of left talus fracture/dislocation.  Patient was brought to the operating room on 04/29/2018 and underwent Procedure(s): OPEN REDUCTION INTERNAL FIXATION (ORIF) LEFT NAVICULAR AND EXTERNAL FIXATION EXTERNAL FIXATION LEG.    Patient was given perioperative antibiotics:  Anti-infectives (From admission, onward)   Start     Dose/Rate Route Frequency Ordered Stop   04/29/18 2100  ceFAZolin (ANCEF) IVPB 1 g/50 mL premix     1 g 100 mL/hr over 30 Minutes Intravenous Every 6 hours 04/29/18 1715 04/30/18 0843   04/29/18 1500  ceFAZolin (ANCEF) IVPB 2g/100 mL premix     2 g 200 mL/hr over 30 Minutes Intravenous On call to O.R. 04/28/18 2248 04/29/18 1508   04/29/18 0600  ceFAZolin (ANCEF) IVPB 2g/100 mL premix  Status:  Discontinued     2 g 200 mL/hr over 30 Minutes Intravenous On call to O.R. 04/29/18 0013 04/29/18 0015    .  Patient was given sequential compression devices, early ambulation, and aspirin for DVT prophylaxis.  Recent vital signs:  Patient Vitals for the past 24 hrs:  BP Temp Temp src Pulse Resp SpO2  05/01/18 0410 106/67 97.9 F (36.6 C) Oral 69 18 97 %  04/30/18 1932 108/62 98.3 F (36.8 C) Oral 60 18 97 %  04/30/18 1343 113/68 98.5 F (36.9 C) Oral 68 20 98 %  04/30/18 1000 124/73  99.1 F (37.3 C) Oral 66 20 98 %  .  Recent laboratory studies: No results found.  Discharge Medications:   Allergies as of 05/01/2018   No Known Allergies     Medication List    STOP taking these medications   amoxicillin-clavulanate 875-125 MG tablet Commonly known as:  AUGMENTIN   cyclobenzaprine 5 MG tablet Commonly known as:  FLEXERIL   meclizine 50 MG tablet Commonly known as:  ANTIVERT   naproxen 500 MG tablet Commonly known as:  NAPROSYN     TAKE these medications   acetaminophen 325 MG tablet Commonly known as:  TYLENOL Take 1-2 tablets (325-650 mg total) by mouth every 6 (six) hours as needed for mild pain (pain score 1-3 or temp > 100.5).   bisacodyl 10 MG suppository Commonly known as:  DULCOLAX Place 1 suppository (10 mg total) rectally daily as needed for moderate constipation.   docusate sodium 100 MG capsule Commonly known as:  COLACE Take 1 capsule (100 mg total) by mouth 2 (two) times daily.   methocarbamol 500 MG tablet Commonly known as:  ROBAXIN Take 1 tablet (500 mg total) by mouth every 6 (six) hours as needed for muscle spasms. What changed:    when to take this  reasons to take this   oxyCODONE 5 MG immediate release tablet Commonly known as:  Oxy IR/ROXICODONE Take 1-2 tablets (5-10 mg total) by mouth every 4 (four) hours as needed for moderate pain or severe  pain (pain score 4-6).            Durable Medical Equipment  (From admission, onward)         Start     Ordered   05/01/18 0806  For home use only DME Walker rolling  Once    Question:  Patient needs a walker to treat with the following condition  Answer:  Fracture closed, calcaneus   05/01/18 0809          Diagnostic Studies: Dg Ankle Complete Left  Result Date: 04/28/2018 CLINICAL DATA:  Larey SeatFell from ladder today twisting the foot and ankle EXAM: LEFT ANKLE COMPLETE - 3+ VIEW COMPARISON:  None. FINDINGS: The ankle joint appears normal. Alignment is normal. No  ankle fracture is seen. Some irregularity on the lateral view over the dorsal aspect of the tarsal navicula may indicate fracture as noted on left foot films and CT of the left foot is recommended. IMPRESSION: Negative left ankle. Cannot exclude fracture of the tarsal navicula. Recommend CT of the left foot. Electronically Signed   By: Dwyane DeePaul  Barry M.D.   On: 04/28/2018 11:02   Ct Foot Left Wo Contrast  Result Date: 04/28/2018 CLINICAL DATA:  Larey SeatFell from a ladder. Evaluate complex foot fractures. EXAM: CT OF THE LEFT FOOT WITHOUT CONTRAST TECHNIQUE: Multidetector CT imaging of the left foot was performed according to the standard protocol. Multiplanar CT image reconstructions were also generated. COMPARISON:  Radiographs 04/28/2018 FINDINGS: Complex fracture dislocation involving the talonavicular joint. There is a complex comminuted intra-articular fracture of the navicular bone laterally with multiple small bone fragments. The talus is subluxed medially in relation to the talus the talar head appears stuck against the medial margin of the fracture. There is also a complex comminuted fracture of the cuboid which involves both articular surfaces. However, the calcaneus and bases of the fourth and fifth metatarsals are intact. The cuneiforms are intact and the cuneiform articulations with the first, second and third metatarsal bases appears maintained. No obvious Lisfranc injury. The ankle joint is maintained. No definite fractures of the tibia or fibula are identified. No definite fracture of the talus. IMPRESSION: 1. Medial dislocation of the navicular bone at the talonavicular joint. Associated comminuted fracture of the lateral navicular bone. No talar fracture is identified. 2. Comminuted intra-articular fractures of the cuboid but the calcaneus and fourth and fifth metatarsals are intact. 3. Intact cuneiforms and metatarsals. The joint spaces are maintained. No Lisfranc injury. Electronically Signed   By: Rudie MeyerP.   Gallerani M.D.   On: 04/28/2018 14:05   Dg Foot Complete Left  Result Date: 04/28/2018 CLINICAL DATA:  Larey SeatFell off ladder today twisting the foot and ankle EXAM: LEFT FOOT - COMPLETE 3+ VIEW COMPARISON:  None. FINDINGS: There is some irregularity of the lateral portion of the tarsal navicula and fracture cannot be excluded. On the lateral view there is a small bony fragment over the dorsal aspect apparently in the region of the tarsal navicula and fracture is a definite consideration. CT of the left foot is recommended. No additional fracture is seen. Joint spaces appear normal. No erosion is noted. IMPRESSION: Suspect fracture of the tarsal navicula laterally. Recommend CT of the left foot to assess further. Electronically Signed   By: Dwyane DeePaul  Barry M.D.   On: 04/28/2018 11:01    Patient benefited maximally from their hospital stay and there were no complications.     Disposition: Discharge disposition: 01-Home or Self Care      Discharge Instructions  Call MD / Call 911   Complete by:  As directed    If you experience chest pain or shortness of breath, CALL 911 and be transported to the hospital emergency room.  If you develope a fever above 101 F, pus (white drainage) or increased drainage or redness at the wound, or calf pain, call your surgeon's office.   Constipation Prevention   Complete by:  As directed    Drink plenty of fluids.  Prune juice may be helpful.  You may use a stool softener, such as Colace (over the counter) 100 mg twice a day.  Use MiraLax (over the counter) for constipation as needed.   Diet - low sodium heart healthy   Complete by:  As directed    Discharge instructions   Complete by:  As directed    Strict non weight bearing on the left foot and walk with walker or crutches.  Do not remove the dressings Elevate the left foot higher than the level of your heart at much as possible. Take an aspirin daily to help prevent blood clots   Increase activity slowly as  tolerated   Complete by:  As directed      Follow-up Information    Nadara Mustard, MD Follow up in 1 week(s).   Specialty:  Orthopedic Surgery Contact information: [address] [place] [address] [phone]            Signed: Lazaro Arms 05/01/2018, 9:24 AM

## 2018-05-05 ENCOUNTER — Encounter (HOSPITAL_COMMUNITY): Payer: Self-pay | Admitting: Orthopedic Surgery

## 2018-05-06 ENCOUNTER — Encounter (INDEPENDENT_AMBULATORY_CARE_PROVIDER_SITE_OTHER): Payer: Self-pay | Admitting: Physician Assistant

## 2018-05-06 ENCOUNTER — Ambulatory Visit (INDEPENDENT_AMBULATORY_CARE_PROVIDER_SITE_OTHER): Payer: Self-pay | Admitting: Physician Assistant

## 2018-05-06 DIAGNOSIS — S92252D Displaced fracture of navicular [scaphoid] of left foot, subsequent encounter for fracture with routine healing: Secondary | ICD-10-CM

## 2018-05-06 NOTE — Progress Notes (Signed)
Office Visit Note   [name]           Date of Birth: 07/02/1990           MRN: [id number] Visit Date: 05/06/2018              Requested by: No referring provider defined for this encounter. PCP: Patient, No Pcp Per  No chief complaint on file.     HPI: The patient is a 27 yo male who is seen for post operative follow up following ORIF of a comminuted navicular fracture of the left foot with reduction of talonavicular joint with internal and external fixation on 04/29/18. He is POD# 7. He reports he is keeping the foot elevated and not putting any weight on the foot walking with a walker. He is minimizing his pain medication use and reports good pain control overall.   Assessment & Plan: Visit Diagnoses:  1. Closed displaced fracture of navicular bone of left foot with routine healing, subsequent encounter     Plan: The operative dressings were removed. External fixation in place with clean pin sites.  Redressed pin sites with dry dressings and adaptic to incisional area and skin tear on upper calf and the covered with kerlix and secured with Ace wrapping. He will follow up in 1 week with x rays at that time.   Follow-Up Instructions: Return in about 8 days (around 05/14/2018).   Ortho Exam  Patient is alert, oriented, no adenopathy, well-dressed, normal affect, normal respiratory effort. The left foot incision is clean, dry and intact and external fixation is in place and without signs of infection or cellulitis. Wiggles toes and neuro intact at toes.   Imaging: No results found. No images are attached to the encounter.  Labs: No results found for: HGBA1C, ESRSEDRATE, CRP, LABURIC, REPTSTATUS, GRAMSTAIN, CULT, LABORGA   No results found for: ALBUMIN, PREALBUMIN, LABURIC  There is no height or weight on file to calculate BMI.  Orders:  No orders of the defined types were placed in this encounter.  No orders of the defined types were placed in this  encounter.    Procedures: No procedures performed  Clinical Data: No additional findings.  ROS:  All other systems negative, except as noted in the HPI. Review of Systems  Objective: Vital Signs: There were no vitals taken for this visit.  Specialty Comments:  No specialty comments available.  PMFS History: Patient Active Problem List   Diagnosis Date Noted  . Displaced fracture of cuboid bone of left foot, initial encounter for closed fracture 04/28/2018  . Multiple closed fractures of left foot   . Displaced fracture of navicular (scaphoid) of right foot, initial encounter for closed fracture   . Tobacco abuse counseling    History reviewed. No pertinent past medical history.  History reviewed. No pertinent family history.  Past Surgical History:  Procedure Laterality Date  . EXTERNAL FIXATION LEG Left 04/29/2018   Procedure: EXTERNAL FIXATION LEG;  Surgeon: Nadara Mustarduda, Marcus V, MD;  [facility];  Service: Orthopedics;  Laterality: Left;  . ORIF ANKLE FRACTURE Left 04/29/2018   Procedure: OPEN REDUCTION INTERNAL FIXATION (ORIF) LEFT NAVICULAR AND EXTERNAL FIXATION;  Surgeon: Nadara Mustarduda, Marcus V, MD;  [facility];  Service: Orthopedics;  Laterality: Left;   Social History   Occupational History  . Not on file  Tobacco Use  . Smoking status: Current Every Day Smoker    Types: Cigarettes  . Smokeless tobacco: Never Used  Substance  and Sexual Activity  . Alcohol use: Yes    Comment: rarely  . Drug use: Yes    Types: Marijuana  . Sexual activity: Not on file

## 2018-05-14 ENCOUNTER — Encounter (INDEPENDENT_AMBULATORY_CARE_PROVIDER_SITE_OTHER): Payer: Self-pay | Admitting: Physician Assistant

## 2018-05-14 ENCOUNTER — Ambulatory Visit (INDEPENDENT_AMBULATORY_CARE_PROVIDER_SITE_OTHER): Payer: Self-pay | Admitting: Physician Assistant

## 2018-05-14 DIAGNOSIS — S92212D Displaced fracture of cuboid bone of left foot, subsequent encounter for fracture with routine healing: Secondary | ICD-10-CM

## 2018-05-14 DIAGNOSIS — S92252D Displaced fracture of navicular [scaphoid] of left foot, subsequent encounter for fracture with routine healing: Secondary | ICD-10-CM

## 2018-05-14 NOTE — Progress Notes (Signed)
Office Visit Note   [name]           Date of Birth: 1991/04/30           MRN: [id number] Visit Date: 05/14/2018              Requested by: No referring provider defined for this encounter. PCP: Patient, No Pcp Per  No chief complaint on file.     HPI: The patient is a 27 year old male who is seen for postoperative follow-up following open reduction internal fixation of a comminuted navicular fracture of the left foot with reduction of the talonavicular joint with internal and external fixation on 04/29/2018.  He is 2 weeks postop.  He reports he has continued to maintain nonweightbearing status and elevate the left foot as much as possible.  He is having some pain but mainly pain at night when he is trying to rest.  He is able to move his toes as instructed and is moving the ankle some.  Assessment & Plan: Visit Diagnoses:  1. Closed displaced fracture of navicular bone of left foot with routine healing, subsequent encounter   2. Closed displaced fracture of cuboid of left foot with routine healing, subsequent encounter     Plan: The sutures were harvested today.  Instructed the patient to utilize half-strength hydrogen peroxide to the pin sites and then apply Bactroban ointment daily.  He is to continue strict nonweightbearing ambulating with crutches or walker.  Percocet was refilled this visit.  He will follow-up in 1 week here with x-rays.  Follow-Up Instructions: Return in about 1 week (around 05/21/2018).   Ortho Exam  Patient is alert, oriented, no adenopathy, well-dressed, normal affect, normal respiratory effort. The left foot incisions are healing well and sutures were harvested this visit.  There is no signs of cellulitis or infection.  External fixator is in place and the pin sites are clean and dry.  The patient is able to move his toes distally and he does have some range of motion at the ankle which is pain-free.  There is minimal edema.  Imaging: No  results found. No images are attached to the encounter.  Labs: No results found for: HGBA1C, ESRSEDRATE, CRP, LABURIC, REPTSTATUS, GRAMSTAIN, CULT, LABORGA   No results found for: ALBUMIN, PREALBUMIN, LABURIC  There is no height or weight on file to calculate BMI.  Orders:  No orders of the defined types were placed in this encounter.  No orders of the defined types were placed in this encounter.    Procedures: No procedures performed  Clinical Data: No additional findings.  ROS:  All other systems negative, except as noted in the HPI. Review of Systems  Objective: Vital Signs: There were no vitals taken for this visit.  Specialty Comments:  No specialty comments available.  PMFS History: Patient Active Problem List   Diagnosis Date Noted  . Displaced fracture of cuboid bone of left foot, initial encounter for closed fracture 04/28/2018  . Multiple closed fractures of left foot   . Displaced fracture of navicular (scaphoid) of right foot, initial encounter for closed fracture   . Tobacco abuse counseling    History reviewed. No pertinent past medical history.  History reviewed. No pertinent family history.  Past Surgical History:  Procedure Laterality Date  . EXTERNAL FIXATION LEG Left 04/29/2018   Procedure: EXTERNAL FIXATION LEG;  Surgeon: Nadara Mustard, MD;  Location[facility] OR;  Service: Orthopedics;  Laterality: Left;  . ORIF ANKLE  FRACTURE Left 04/29/2018   Procedure: OPEN REDUCTION INTERNAL FIXATION (ORIF) LEFT NAVICULAR AND EXTERNAL FIXATION;  Surgeon: Nadara Mustarduda, Marcus V, MD;  [facility];  Service: Orthopedics;  Laterality: Left;   Social History   Occupational History  . Not on file  Tobacco Use  . Smoking status: Current Every Day Smoker    Types: Cigarettes  . Smokeless tobacco: Never Used  Substance and Sexual Activity  . Alcohol use: Yes    Comment: rarely  . Drug use: Yes    Types: Marijuana  . Sexual activity: Not on file

## 2018-05-21 ENCOUNTER — Encounter (INDEPENDENT_AMBULATORY_CARE_PROVIDER_SITE_OTHER): Payer: Self-pay | Admitting: Physician Assistant

## 2018-05-21 ENCOUNTER — Ambulatory Visit (INDEPENDENT_AMBULATORY_CARE_PROVIDER_SITE_OTHER): Payer: Self-pay

## 2018-05-21 ENCOUNTER — Ambulatory Visit (INDEPENDENT_AMBULATORY_CARE_PROVIDER_SITE_OTHER): Payer: Self-pay | Admitting: Physician Assistant

## 2018-05-21 VITALS — Ht 69.0 in | Wt 145.0 lb

## 2018-05-21 DIAGNOSIS — S92252D Displaced fracture of navicular [scaphoid] of left foot, subsequent encounter for fracture with routine healing: Secondary | ICD-10-CM

## 2018-05-21 NOTE — Progress Notes (Signed)
Office Visit Note   [name]           Date of Birth: 07/16/1990           MRN: [id number] Visit Date: 05/21/2018              Requested by: No referring provider defined for this encounter. PCP: Patient, No Pcp Per  Chief Complaint  Patient presents with  . Left Foot - Routine Post Op      HPI: The patient is a 27 yo gentleman who is seen for post operative follow up following open reduction internal fixation of the comminuted navicular fracture of the left foot with reduction of the talonavicular joint with internal and externa fixation on 04/29/2018. He is 3 weeks post op. He is non weight bearing walking with a walker. He is elevating the left foot and reports only occasional pain over the area. He has been cleaning the pins sites daily with 1/2 strength hydrogen peroxide and applying bactroban ointment to the pins sites daily.   Assessment & Plan: Visit Diagnoses:  1. Closed displaced fracture of navicular bone of left foot with routine healing, subsequent encounter     Plan: Continue strict non weight bearing of the left foot. Continue pin care daily.  follow up in 1 week with follow up x rays and will plan for pin removal in the office next week.   Follow-Up Instructions: Return in about 1 week (around 05/28/2018).   Ortho Exam  Patient is alert, oriented, no adenopathy, well-dressed, normal affect, normal respiratory effort. Left foot The incisions are clean, dry and intact and healing well. No signs of infection or cellulitis. Minimal edema over the left medial foot without erythema or tenderness to palpation. Good ankle range of motion. Good pedal pulses.   Imaging: Xr Foot Complete Left  Result Date: 05/21/2018 Left foot xrays reviewed with Dr. Lajoyce Cornersuda and show good position and alignment of navicular fracture and talonavicular joint is congruent with internal and external fixation in place. No signs of lucency around hardware.  No images are attached  to the encounter.  Labs: No results found for: HGBA1C, ESRSEDRATE, CRP, LABURIC, REPTSTATUS, GRAMSTAIN, CULT, LABORGA   No results found for: ALBUMIN, PREALBUMIN, LABURIC  Body mass index is 21.41 kg/m.  Orders:  Orders Placed This Encounter  Procedures  . XR Foot Complete Left   No orders of the defined types were placed in this encounter.    Procedures: No procedures performed  Clinical Data: No additional findings.  ROS:  All other systems negative, except as noted in the HPI. Review of Systems  Objective: Vital Signs: Ht 5\' 9"  (1.753 m)   Wt 145 lb (65.8 kg)   BMI 21.41 kg/m   Specialty Comments:  No specialty comments available.  PMFS History: Patient Active Problem List   Diagnosis Date Noted  . Displaced fracture of cuboid bone of left foot, initial encounter for closed fracture 04/28/2018  . Multiple closed fractures of left foot   . Displaced fracture of navicular (scaphoid) of right foot, initial encounter for closed fracture   . Tobacco abuse counseling    History reviewed. No pertinent past medical history.  History reviewed. No pertinent family history.  Past Surgical History:  Procedure Laterality Date  . EXTERNAL FIXATION LEG Left 04/29/2018   Procedure: EXTERNAL FIXATION LEG;  Surgeon: Nadara Mustarduda, Marcus V, MD;  [facility];  Service: Orthopedics;  Laterality: Left;  . ORIF ANKLE FRACTURE Left 04/29/2018  Procedure: OPEN REDUCTION INTERNAL FIXATION (ORIF) LEFT NAVICULAR AND EXTERNAL FIXATION;  Surgeon: Nadara Mustard, MD;  Location: [facility] OR;  Service: Orthopedics;  Laterality: Left;   Social History   Occupational History  . Not on file  Tobacco Use  . Smoking status: Current Every Day Smoker    Types: Cigarettes  . Smokeless tobacco: Never Used  Substance and Sexual Activity  . Alcohol use: Yes    Comment: rarely  . Drug use: Yes    Types: Marijuana  . Sexual activity: Not on file

## 2018-05-28 ENCOUNTER — Ambulatory Visit (INDEPENDENT_AMBULATORY_CARE_PROVIDER_SITE_OTHER): Payer: Self-pay | Admitting: Orthopedic Surgery

## 2018-05-28 ENCOUNTER — Ambulatory Visit (INDEPENDENT_AMBULATORY_CARE_PROVIDER_SITE_OTHER): Payer: Self-pay

## 2018-05-28 ENCOUNTER — Encounter (INDEPENDENT_AMBULATORY_CARE_PROVIDER_SITE_OTHER): Payer: Self-pay | Admitting: Physician Assistant

## 2018-05-28 VITALS — Ht 69.0 in | Wt 145.0 lb

## 2018-05-28 DIAGNOSIS — M25572 Pain in left ankle and joints of left foot: Secondary | ICD-10-CM

## 2018-05-30 ENCOUNTER — Encounter (INDEPENDENT_AMBULATORY_CARE_PROVIDER_SITE_OTHER): Payer: Self-pay | Admitting: Orthopedic Surgery

## 2018-05-30 NOTE — Progress Notes (Signed)
   Office Visit Note   [name]           Date of Birth: 08/07/1990           MRN: [id number] Visit Date: 05/28/2018              Requested by: No referring provider defined for this encounter. PCP: Patient, No Pcp Per  Chief Complaint  Patient presents with  . Left Foot - Routine Post Op    04/29/18 left internal external fixation comminuted  navicular fx and talonavicular joint       HPI: Patient presents in follow-up approximately 4 weeks status post internal fixation navicular fracture on the left with external fixation.  Patient denies any problems.  Assessment & Plan: Visit Diagnoses:  1. Pain in left ankle and joints of left foot     Plan: The external fixator was removed patient will continue nonweightbearing will work on dorsiflexion of the ankle.  Follow-Up Instructions: Return in about 3 weeks (around 06/18/2018).   Ortho Exam  Patient is alert, oriented, no adenopathy, well-dressed, normal affect, normal respiratory effort. Examination the pin tracks are clean and dry there is no redness no cellulitis no swelling.  After informed consent the pin tract sites were prepped using Betadine.  The external fixator was removed without complications.  A sterile dressing was applied.  Imaging: No results found. No images are attached to the encounter.  Labs: No results found for: HGBA1C, ESRSEDRATE, CRP, LABURIC, REPTSTATUS, GRAMSTAIN, CULT, LABORGA   No results found for: ALBUMIN, PREALBUMIN, LABURIC  Body mass index is 21.41 kg/m.  Orders:  Orders Placed This Encounter  Procedures  . XR Foot Complete Left   No orders of the defined types were placed in this encounter.    Procedures: No procedures performed  Clinical Data: No additional findings.  ROS:  All other systems negative, except as noted in the HPI. Review of Systems  Objective: Vital Signs: Ht 5\' 9"  (1.753 m)   Wt 145 lb (65.8 kg)   BMI 21.41 kg/m   Specialty Comments:    No specialty comments available.  PMFS History: Patient Active Problem List   Diagnosis Date Noted  . Displaced fracture of cuboid bone of left foot, initial encounter for closed fracture 04/28/2018  . Multiple closed fractures of left foot   . Displaced fracture of navicular (scaphoid) of right foot, initial encounter for closed fracture   . Tobacco abuse counseling    History reviewed. No pertinent past medical history.  History reviewed. No pertinent family history.  Past Surgical History:  Procedure Laterality Date  . EXTERNAL FIXATION LEG Left 04/29/2018   Procedure: EXTERNAL FIXATION LEG;  Surgeon: Nadara Mustarduda, Marcus V, MD;  [facility];  Service: Orthopedics;  Laterality: Left;  . ORIF ANKLE FRACTURE Left 04/29/2018   Procedure: OPEN REDUCTION INTERNAL FIXATION (ORIF) LEFT NAVICULAR AND EXTERNAL FIXATION;  Surgeon: Nadara Mustarduda, Marcus V, MD;  [facility];  Service: Orthopedics;  Laterality: Left;   Social History   Occupational History  . Not on file  Tobacco Use  . Smoking status: Current Every Day Smoker    Types: Cigarettes  . Smokeless tobacco: Never Used  Substance and Sexual Activity  . Alcohol use: Yes    Comment: rarely  . Drug use: Yes    Types: Marijuana  . Sexual activity: Not on file

## 2018-06-18 ENCOUNTER — Ambulatory Visit (INDEPENDENT_AMBULATORY_CARE_PROVIDER_SITE_OTHER): Payer: Self-pay | Admitting: Orthopedic Surgery

## 2018-06-18 ENCOUNTER — Ambulatory Visit (INDEPENDENT_AMBULATORY_CARE_PROVIDER_SITE_OTHER): Payer: Self-pay

## 2018-06-18 ENCOUNTER — Encounter (INDEPENDENT_AMBULATORY_CARE_PROVIDER_SITE_OTHER): Payer: Self-pay | Admitting: Physician Assistant

## 2018-06-18 VITALS — Wt 145.0 lb

## 2018-06-18 DIAGNOSIS — S92252D Displaced fracture of navicular [scaphoid] of left foot, subsequent encounter for fracture with routine healing: Secondary | ICD-10-CM

## 2018-06-18 NOTE — Progress Notes (Signed)
   Office Visit Note   [name]           Date of Birth: December 28, 1990           MRN: [id number] Visit Date: 06/18/2018              Requested by: No referring provider defined for this encounter. PCP: Patient, No Pcp Per  Chief Complaint  Patient presents with  . Left Foot - Routine Post Op      HPI: Patient is 6 weeks status post open reduction internal fixation navicular fracture dislocation.  Patient has no complaints he is nonweightbearing.  Assessment & Plan: Visit Diagnoses:  1. Closed displaced fracture of navicular bone of left foot with routine healing, subsequent encounter     Plan: Patient will advance weightbearing as tolerated he is given instructions and demonstrated Achilles stretching recommended a stiff soled sneaker such as a Haematologist.  Continue scar massage continue compression.  Plan for three-view radiographs left foot at follow-up.  Follow-Up Instructions: No follow-ups on file.   Ortho Exam  Patient is alert, oriented, no adenopathy, well-dressed, normal affect, normal respiratory effort. Examination patient has minimal swelling he has good hair growth there are no dystrophic changes the incision is healed well there is no keloiding.  He has dorsiflexion to neutral and he still instructions for Achilles stretching.  Radiograph shows no complicating features patient will advance weightbearing as tolerated.  Imaging: No results found. No images are attached to the encounter.  Labs: No results found for: HGBA1C, ESRSEDRATE, CRP, LABURIC, REPTSTATUS, GRAMSTAIN, CULT, LABORGA   No results found for: ALBUMIN, PREALBUMIN, LABURIC  Body mass index is 21.41 kg/m.  Orders:  Orders Placed This Encounter  Procedures  . XR Foot Complete Left   No orders of the defined types were placed in this encounter.    Procedures: No procedures performed  Clinical Data: No additional findings.  ROS:  All other systems negative, except as  noted in the HPI. Review of Systems  Objective: Vital Signs: Wt 145 lb (65.8 kg)   BMI 21.41 kg/m   Specialty Comments:  No specialty comments available.  PMFS History: Patient Active Problem List   Diagnosis Date Noted  . Displaced fracture of cuboid bone of left foot, initial encounter for closed fracture 04/28/2018  . Multiple closed fractures of left foot   . Displaced fracture of navicular (scaphoid) of right foot, initial encounter for closed fracture   . Tobacco abuse counseling    No past medical history on file.  No family history on file.  Past Surgical History:  Procedure Laterality Date  . EXTERNAL FIXATION LEG Left 04/29/2018   Procedure: EXTERNAL FIXATION LEG;  Surgeon: Nadara Mustard, MD;  Location: [facility] OR;  Service: Orthopedics;  Laterality: Left;  . ORIF ANKLE FRACTURE Left 04/29/2018   Procedure: OPEN REDUCTION INTERNAL FIXATION (ORIF) LEFT NAVICULAR AND EXTERNAL FIXATION;  Surgeon: Nadara Mustard, MD;  Location: [facility] OR;  Service: Orthopedics;  Laterality: Left;   Social History   Occupational History  . Not on file  Tobacco Use  . Smoking status: Current Every Day Smoker    Types: Cigarettes  . Smokeless tobacco: Never Used  Substance and Sexual Activity  . Alcohol use: Yes    Comment: rarely  . Drug use: Yes    Types: Marijuana  . Sexual activity: Not on file

## 2018-07-13 ENCOUNTER — Ambulatory Visit (INDEPENDENT_AMBULATORY_CARE_PROVIDER_SITE_OTHER): Payer: Self-pay

## 2018-07-13 ENCOUNTER — Encounter (INDEPENDENT_AMBULATORY_CARE_PROVIDER_SITE_OTHER): Payer: Self-pay | Admitting: Physician Assistant

## 2018-07-13 ENCOUNTER — Ambulatory Visit (INDEPENDENT_AMBULATORY_CARE_PROVIDER_SITE_OTHER): Payer: Self-pay | Admitting: Orthopedic Surgery

## 2018-07-13 DIAGNOSIS — S92252D Displaced fracture of navicular [scaphoid] of left foot, subsequent encounter for fracture with routine healing: Secondary | ICD-10-CM

## 2018-07-13 NOTE — Progress Notes (Signed)
Office Visit Note   Patient: Aaron PeelingRonald S Cannella           Date of Birth: 07/28/1990           MRN: [id number] Visit Date: 07/13/2018              Requested by: No referring provider defined for this encounter. PCP: Patient, No Pcp Per  Chief Complaint  Patient presents with  . Left Foot - Follow-up      HPI: Patient is a 28 year old gentleman seen in follow-up for open reduction internal fixation navicular fracture he has no complaints he has been nonweightbearing he is 2-1/2 months out.  Assessment & Plan: Visit Diagnoses:  1. Closed displaced fracture of navicular bone of left foot with routine healing, subsequent encounter     Plan: Patient will start working on more aggressive subtalar range of motion continue with range of motion the ankle progressed weightbearing as tolerated.  Repeat three-view radiographs of the left foot at follow-up.  Follow-Up Instructions: Return in about 4 weeks (around 08/10/2018).   Ortho Exam  Patient is alert, oriented, no adenopathy, well-dressed, normal affect, normal respiratory effort. Examination patient's incision is well-healed there is no swelling he has good range of motion of the ankle he does have subtalar motion about 20 degrees of inversion and eversion but lacks full subtalar motion.  Imaging: Xr Foot Complete Left  Result Date: 07/13/2018 3 view radiographs of the left foot shows stable alignment of the internal fixation for the navicular fracture there is no joint space narrowing no avascular necrosis no collapse.  No images are attached to the encounter.  Labs: No results found for: HGBA1C, ESRSEDRATE, CRP, LABURIC, REPTSTATUS, GRAMSTAIN, CULT, LABORGA   No results found for: ALBUMIN, PREALBUMIN, LABURIC  There is no height or weight on file to calculate BMI.  Orders:  Orders Placed This Encounter  Procedures  . XR Foot Complete Left   No orders of the defined types were placed in this encounter.    Procedures: No  procedures performed  Clinical Data: No additional findings.  ROS:  All other systems negative, except as noted in the HPI. Review of Systems  Objective: Vital Signs: There were no vitals taken for this visit.  Specialty Comments:  No specialty comments available.  PMFS History: Patient Active Problem List   Diagnosis Date Noted  . Displaced fracture of cuboid bone of left foot, initial encounter for closed fracture 04/28/2018  . Multiple closed fractures of left foot   . Displaced fracture of navicular (scaphoid) of right foot, initial encounter for closed fracture   . Tobacco abuse counseling    History reviewed. No pertinent past medical history.  History reviewed. No pertinent family history.  Past Surgical History:  Procedure Laterality Date  . EXTERNAL FIXATION LEG Left 04/29/2018   Procedure: EXTERNAL FIXATION LEG;  Surgeon: Nadara Mustarduda, Marcus V, MD;  [facility];  Service: Orthopedics;  Laterality: Left;  . ORIF ANKLE FRACTURE Left 04/29/2018   Procedure: OPEN REDUCTION INTERNAL FIXATION (ORIF) LEFT NAVICULAR AND EXTERNAL FIXATION;  Surgeon: Nadara Mustarduda, Marcus V, MD;  [facility];  Service: Orthopedics;  Laterality: Left;   Social History   Occupational History  . Not on file  Tobacco Use  . Smoking status: Current Every Day Smoker    Types: Cigarettes  . Smokeless tobacco: Never Used  Substance and Sexual Activity  . Alcohol use: Yes    Comment: rarely  . Drug use: Yes  Types: Marijuana  . Sexual activity: Not on file

## 2018-08-10 ENCOUNTER — Ambulatory Visit (INDEPENDENT_AMBULATORY_CARE_PROVIDER_SITE_OTHER): Payer: Self-pay | Admitting: Orthopedic Surgery

## 2018-08-10 ENCOUNTER — Ambulatory Visit (INDEPENDENT_AMBULATORY_CARE_PROVIDER_SITE_OTHER): Payer: Self-pay

## 2018-08-10 ENCOUNTER — Encounter (INDEPENDENT_AMBULATORY_CARE_PROVIDER_SITE_OTHER): Payer: Self-pay | Admitting: Orthopedic Surgery

## 2018-08-10 DIAGNOSIS — S92252D Displaced fracture of navicular [scaphoid] of left foot, subsequent encounter for fracture with routine healing: Secondary | ICD-10-CM

## 2018-08-10 NOTE — Progress Notes (Signed)
Office Visit Note   [name]           Date of Birth: 06/04/1991           MRN: [id number] Visit Date: 08/10/2018              Requested by: No referring provider defined for this encounter. PCP: Patient, No Pcp Per  Chief Complaint  Patient presents with  . Left Foot - Follow-up      HPI: Patient is a 28 year old gentleman who presents 3 and half months status post open reduction internal fixation for complex fracture dislocation of the navicular.  Patient is currently wearing regular sneakers he states he has no pain with activities he states he has a slight limp.  Assessment & Plan: Visit Diagnoses:  1. Closed displaced fracture of navicular bone of left foot with routine healing, subsequent encounter     Plan: Discussed the patient can look for work and advance his activities as tolerated no restrictions.  Follow-Up Instructions: Return if symptoms worsen or fail to improve.   Ortho Exam  Patient is alert, oriented, no adenopathy, well-dressed, normal affect, normal respiratory effort. Examination patient has excellent subtalar and ankle range of motion there are no limitations he has a slight limp.  Imaging: Xr Foot Complete Left  Result Date: 08/10/2018 3 view radiographs of the left foot shows no avascular necrosis of the navicular hardware is in place there is no subluxation or dislocation.  Joints are congruent.  No images are attached to the encounter.  Labs: No results found for: HGBA1C, ESRSEDRATE, CRP, LABURIC, REPTSTATUS, GRAMSTAIN, CULT, LABORGA   No results found for: ALBUMIN, PREALBUMIN, LABURIC  There is no height or weight on file to calculate BMI.  Orders:  Orders Placed This Encounter  Procedures  . XR Foot Complete Left   No orders of the defined types were placed in this encounter.    Procedures: No procedures performed  Clinical Data: No additional findings.  ROS:  All other systems negative, except as noted in  the HPI. Review of Systems  Objective: Vital Signs: There were no vitals taken for this visit.  Specialty Comments:  No specialty comments available.  PMFS History: Patient Active Problem List   Diagnosis Date Noted  . Displaced fracture of cuboid bone of left foot, initial encounter for closed fracture 04/28/2018  . Multiple closed fractures of left foot   . Displaced fracture of navicular (scaphoid) of right foot, initial encounter for closed fracture   . Tobacco abuse counseling    History reviewed. No pertinent past medical history.  History reviewed. No pertinent family history.  Past Surgical History:  Procedure Laterality Date  . EXTERNAL FIXATION LEG Left 04/29/2018   Procedure: EXTERNAL FIXATION LEG;  Surgeon: Nadara Mustard, MD;  Location: [facility] OR;  Service: Orthopedics;  Laterality: Left;  . ORIF ANKLE FRACTURE Left 04/29/2018   Procedure: OPEN REDUCTION INTERNAL FIXATION (ORIF) LEFT NAVICULAR AND EXTERNAL FIXATION;  Surgeon: Nadara Mustard, MD;  Location: [facility] OR;  Service: Orthopedics;  Laterality: Left;   Social History   Occupational History  . Not on file  Tobacco Use  . Smoking status: Current Every Day Smoker    Types: Cigarettes  . Smokeless tobacco: Never Used  Substance and Sexual Activity  . Alcohol use: Yes    Comment: rarely  . Drug use: Yes    Types: Marijuana  . Sexual activity: Not on file

## 2019-05-29 ENCOUNTER — Encounter (HOSPITAL_COMMUNITY): Payer: Self-pay | Admitting: *Deleted

## 2019-05-29 ENCOUNTER — Other Ambulatory Visit: Payer: Self-pay

## 2019-05-29 ENCOUNTER — Emergency Department (HOSPITAL_COMMUNITY)
Admission: EM | Admit: 2019-05-29 | Discharge: 2019-05-30 | Disposition: A | Discharge status: Home / Self Care | Payer: Self-pay | Attending: Emergency Medicine | Admitting: Emergency Medicine

## 2019-05-29 DIAGNOSIS — F17210 Nicotine dependence, cigarettes, uncomplicated: Secondary | ICD-10-CM | POA: Insufficient documentation

## 2019-05-29 DIAGNOSIS — R079 Chest pain, unspecified: Secondary | ICD-10-CM | POA: Insufficient documentation

## 2019-05-29 DIAGNOSIS — R109 Unspecified abdominal pain: Secondary | ICD-10-CM | POA: Insufficient documentation

## 2019-05-29 DIAGNOSIS — Z79899 Other long term (current) drug therapy: Secondary | ICD-10-CM | POA: Insufficient documentation

## 2019-05-29 MED ORDER — SODIUM CHLORIDE 0.9% FLUSH: 3.0000 mL | Freq: Once | INTRAVENOUS | Status: DC

## 2019-05-29 NOTE — ED Triage Notes (Signed)
The pt is c/o some chest and abd pain  Feeling hot all over for 3 days  Unknown if he has had a temperature

## 2019-05-30 ENCOUNTER — Emergency Department (HOSPITAL_COMMUNITY): Payer: Self-pay

## 2019-05-30 ENCOUNTER — Other Ambulatory Visit: Payer: Self-pay

## 2019-05-30 LAB — CBC
HCT: 43.6 % (ref 39.0–52.0)
Hemoglobin: 14.6 g/dL (ref 13.0–17.0)
MCH: 31.5 pg (ref 26.0–34.0)
MCHC: 33.5 g/dL (ref 30.0–36.0)
MCV: 94.2 fL (ref 80.0–100.0)
Platelets: 243 10*3/uL (ref 150–400)
RBC: 4.63 MIL/uL (ref 4.22–5.81)
RDW: 12.3 % (ref 11.5–15.5)
WBC: 9.1 10*3/uL (ref 4.0–10.5)
nRBC: 0 % (ref 0.0–0.2)

## 2019-05-30 LAB — BASIC METABOLIC PANEL
Anion gap: 11 (ref 5–15)
BUN: 13 mg/dL (ref 6–20)
CO2: 24 mmol/L (ref 22–32)
Calcium: 9.5 mg/dL (ref 8.9–10.3)
Chloride: 102 mmol/L (ref 98–111)
Creatinine, Ser: 0.99 mg/dL (ref 0.61–1.24)
GFR calc Af Amer: >60 mL/min (ref >60)
GFR calc non Af Amer: >60 mL/min (ref >60)
Glucose, Bld: 111 mg/dL — ABNORMAL HIGH (ref 70–99)
Potassium: 3.8 mmol/L (ref 3.5–5.1)
Sodium: 137 mmol/L (ref 135–145)

## 2019-05-30 LAB — D-DIMER, QUANTITATIVE (NOT AT ARMC): D-Dimer, Quant: <0.27 ug/mL-FEU (ref 0.00–0.50)

## 2019-05-30 LAB — TROPONIN I (HIGH SENSITIVITY)
Troponin I (High Sensitivity): <2 ng/L (ref <18)
Troponin I (High Sensitivity): 3 ng/L (ref <18)

## 2019-05-30 MED ORDER — FAMOTIDINE 20 MG PO TABS
40.0000 mg | ORAL_TABLET | Freq: Once | ORAL | Status: AC
  Administered 2019-05-30: 40 mg via ORAL
  Filled 2019-05-30: qty 2

## 2019-05-30 MED ORDER — FAMOTIDINE 20 MG PO TABS: 20.0000 mg | ORAL_TABLET | Freq: Two times a day (BID) | ORAL | 0 refills | Status: DC

## 2019-05-30 MED ORDER — ALUM & MAG HYDROXIDE-SIMETH 200-200-20 MG/5ML PO SUSP
30.0000 mL | Freq: Once | ORAL | Status: AC
  Administered 2019-05-30: 04:00:00 30 mL via ORAL
  Filled 2019-05-30: qty 30

## 2019-05-30 MED ORDER — DICYCLOMINE HCL 10 MG/5ML PO SOLN
10.0000 mg | Freq: Once | ORAL | Status: AC
  Administered 2019-05-30: 04:00:00 10 mg via ORAL
  Filled 2019-05-30: qty 5

## 2019-05-30 MED ORDER — LIDOCAINE VISCOUS HCL 2 % MT SOLN
15.0000 mL | Freq: Once | OROMUCOSAL | Status: AC
  Administered 2019-05-30: 15 mL via ORAL
  Filled 2019-05-30: qty 15

## 2019-05-30 NOTE — ED Provider Notes (Signed)
[facility] Provider [facility]   CSN: [id number] Arrival date & time: 05/29/19  2334     History Chief Complaint  Patient presents with  . Generalized Body Aches    Candida PeelingRonald S Mccamy is a 28 y.o. male.  Patient presents to the emergency department with multiple complaints.  Patient reports that he has been feeling weak and dizzy with nausea.  He has not had any vomiting but feels like he needs to.  No diarrhea or constipation.  Patient reports discomfort in his abdomen as well as chest.  He has a burning sensation that goes up into the chest and feels like it is at the base of his throat.  He also has felt some shortness of breath.  He reports that this is mostly present when he has chest pain and it feels like he cannot get a deep breath.  Symptoms have been present for 3 days.  He has not identified any exacerbating or alleviating factors.        History reviewed. No pertinent past medical history.  Patient Active Problem List   Diagnosis Date Noted  . Displaced fracture of cuboid bone of left foot, initial encounter for closed fracture 04/28/2018  . Multiple closed fractures of left foot   . Displaced fracture of navicular (scaphoid) of right foot, initial encounter for closed fracture   . Tobacco abuse counseling     Past Surgical History:  Procedure Laterality Date  . EXTERNAL FIXATION LEG Left 04/29/2018   Procedure: EXTERNAL FIXATION LEG;  Surgeon: Nadara Mustarduda, Marcus V, MD;  [facility];  Service: Orthopedics;  Laterality: Left;  . ORIF ANKLE FRACTURE Left 04/29/2018   Procedure: OPEN REDUCTION INTERNAL FIXATION (ORIF) LEFT NAVICULAR AND EXTERNAL FIXATION;  Surgeon: Nadara Mustarduda, Marcus V, MD;  [facility];  Service: Orthopedics;  Laterality: Left;       No family history on file.  Social History   Tobacco Use  . Smoking status: Current Every Day Smoker    Types: Cigarettes  . Smokeless tobacco: Never Used  Substance Use Topics  .  Alcohol use: Yes    Comment: rarely  . Drug use: Yes    Types: Marijuana    Home Medications Prior to Admission medications   Medication Sig Start Date End Date Taking? Authorizing Provider  acetaminophen (TYLENOL) 325 MG tablet Take 1-2 tablets (325-650 mg total) by mouth every 6 (six) hours as needed for mild pain (pain score 1-3 or temp > 100.5). 05/01/18   Rayburn, Fanny BienShawn Montgomery, PA-C  bisacodyl (DULCOLAX) 10 MG suppository Place 1 suppository (10 mg total) rectally daily as needed for moderate constipation. 05/01/18   Rayburn, Fanny BienShawn Montgomery, PA-C  docusate sodium (COLACE) 100 MG capsule Take 1 capsule (100 mg total) by mouth 2 (two) times daily. 05/01/18   Rayburn, Fanny BienShawn Montgomery, PA-C  famotidine (PEPCID) 20 MG tablet Take 1 tablet (20 mg total) by mouth 2 (two) times daily. 05/30/19   Gilda CreasePollina, Shakiah Wester J, MD  methocarbamol (ROBAXIN) 500 MG tablet Take 1 tablet (500 mg total) by mouth every 6 (six) hours as needed for muscle spasms. 05/01/18   Rayburn, Fanny BienShawn Montgomery, PA-C  oxyCODONE (OXY IR/ROXICODONE) 5 MG immediate release tablet Take 1-2 tablets (5-10 mg total) by mouth every 4 (four) hours as needed for moderate pain or severe pain (pain score 4-6). 05/01/18   Rayburn, Fanny BienShawn Montgomery, PA-C    Allergies    Patient has no known allergies.  Review of Systems  Review of Systems  HENT: Negative for congestion.   Respiratory: Positive for shortness of breath. Negative for cough.   Cardiovascular: Positive for chest pain.  Gastrointestinal: Positive for abdominal pain and nausea.  All other systems reviewed and are negative.   Physical Exam Updated Vital Signs BP 111/84 (BP Location: Right Arm)   Pulse 86   Temp 98.4 F (36.9 C) (Oral)   Resp 18   Ht 5\' 9"  (1.753 m)   Wt 65.8 kg   SpO2 97%   BMI 21.41 kg/m   Physical Exam Vitals and nursing note reviewed.  Constitutional:      General: He is not in acute distress.    Appearance: Normal appearance. He  is well-developed.  HENT:     Head: Normocephalic and atraumatic.     Right Ear: Hearing normal.     Left Ear: Hearing normal.     Nose: Nose normal.  Eyes:     Conjunctiva/sclera: Conjunctivae normal.     Pupils: Pupils are equal, round, and reactive to light.  Cardiovascular:     Rate and Rhythm: Regular rhythm.     Heart sounds: S1 normal and S2 normal. No murmur. No friction rub. No gallop.   Pulmonary:     Effort: Pulmonary effort is normal. No respiratory distress.     Breath sounds: Normal breath sounds.  Chest:     Chest wall: No tenderness.  Abdominal:     General: Bowel sounds are normal.     Palpations: Abdomen is soft.     Tenderness: There is no abdominal tenderness. There is no guarding or rebound. Negative signs include Murphy's sign and McBurney's sign.     Hernia: No hernia is present.  Musculoskeletal:        General: Normal range of motion.     Cervical back: Normal range of motion and neck supple.  Skin:    General: Skin is warm and dry.     Findings: No rash.  Neurological:     Mental Status: He is alert and oriented to person, place, and time.     GCS: GCS eye subscore is 4. GCS verbal subscore is 5. GCS motor subscore is 6.     Cranial Nerves: No cranial nerve deficit.     Sensory: No sensory deficit.     Coordination: Coordination normal.  Psychiatric:        Speech: Speech normal.        Behavior: Behavior normal.        Thought Content: Thought content normal.     ED Results / Procedures / Treatments   Labs (all labs ordered are listed, but only abnormal results are displayed) Labs Reviewed  BASIC METABOLIC PANEL - Abnormal; Notable for the following components:      Result Value   Glucose, Bld 111 (*)    All other components within normal limits  CBC  D-DIMER, QUANTITATIVE (NOT AT [facility])  TROPONIN I (HIGH SENSITIVITY)  TROPONIN I (HIGH SENSITIVITY)    EKG EKG Interpretation  Date/Time:  Saturday May 29 2019 23:38:29  EST Ventricular Rate:  73 PR Interval:  142 QRS Duration: 86 QT Interval:  402 QTC Calculation: 442 R Axis:   105 Text Interpretation: Normal sinus rhythm Rightward axis Possible Anterior infarct , age undetermined Abnormal ECG No significant change since last tracing Confirmed by Orpah Greek [phone]) on 05/30/2019 12:49:03 AM   Radiology DG Chest 2 View  Result Date: 05/30/2019 CLINICAL DATA:  Chest pain.  EXAM: CHEST - 2 VIEW COMPARISON:  June 11, 2014. FINDINGS: The heart size and mediastinal contours are within normal limits. Both lungs are clear. The visualized skeletal structures are unremarkable. IMPRESSION: No active cardiopulmonary disease. Electronically Signed   By: Katherine Mantle M.D.   On: 05/30/2019 00:17    Procedures Procedures (including critical care time)  Medications Ordered in ED Medications  sodium chloride flush (NS) 0.9 % injection 3 mL (3 mLs Intravenous Not Given 05/30/19 0046)  alum & mag hydroxide-simeth (MAALOX/MYLANTA) 200-200-20 MG/5ML suspension 30 mL (has no administration in time range)    And  lidocaine (XYLOCAINE) 2 % viscous mouth solution 15 mL (has no administration in time range)  dicyclomine (BENTYL) 10 MG/5ML solution 10 mg (has no administration in time range)  famotidine (PEPCID) tablet 40 mg (has no administration in time range)    ED Course  I have reviewed the triage vital signs and the nursing notes.  Pertinent labs & imaging results that were available during my care of the patient were reviewed by me and considered in my medical decision making (see chart for details).    MDM Rules/Calculators/A&P                      Patient presents to the emergency department with multiple complaints.  Patient complaining of abdominal pain as well as chest pain, some shortness of breath and inability to catch his breath.  Chest pain seems to be more of a burning sensation in the chest and abdominal discomfort is diffuse and  mild.  Abdominal exam is benign, nontender.  He does report a sensation of feeling burning up into the base of his throat and does have a stated history of indigestion.  No known history of peptic ulcer disease, no rectal bleeding or melanotic stools.  Cardiac evaluation is negative and reassuring.  Symptoms are very atypical for cardiac etiology.  Additionally, D-dimer is negative.  He is also PERC negative.  Patient has been adequately evaluated here and does not require hospitalization. Final Clinical Impression(s) / ED Diagnoses Final diagnoses:  Chest pain, unspecified type  Abdominal pain, unspecified abdominal location    Rx / DC Orders ED Discharge Orders         Ordered    famotidine (PEPCID) 20 MG tablet  2 times daily     05/30/19 0247           Gilda Crease, MD 05/30/19 [phone]

## 2019-10-22 IMAGING — DX DG FOOT COMPLETE 3+V*L*
3 series · 3 of 3 positions shown · non-contrast
Comparison: None.

CLINICAL DATA: Fell off ladder today twisting the foot and ankle

EXAM:
LEFT FOOT - COMPLETE 3+ VIEW

[x foot ap left]
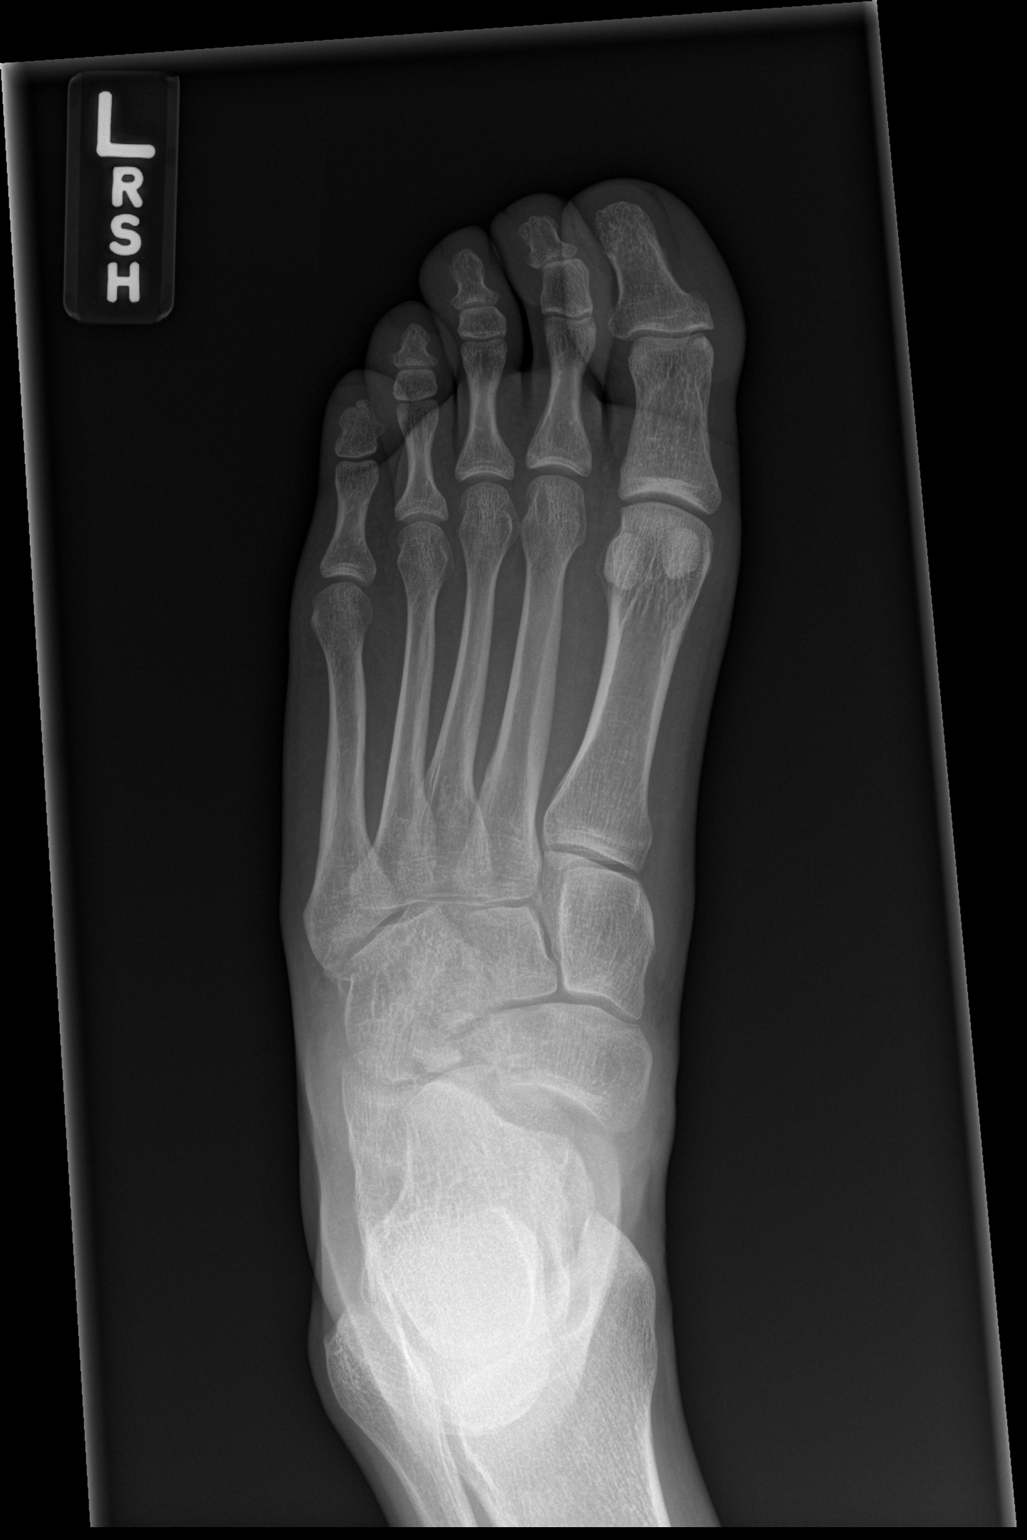

[x foot obl left]
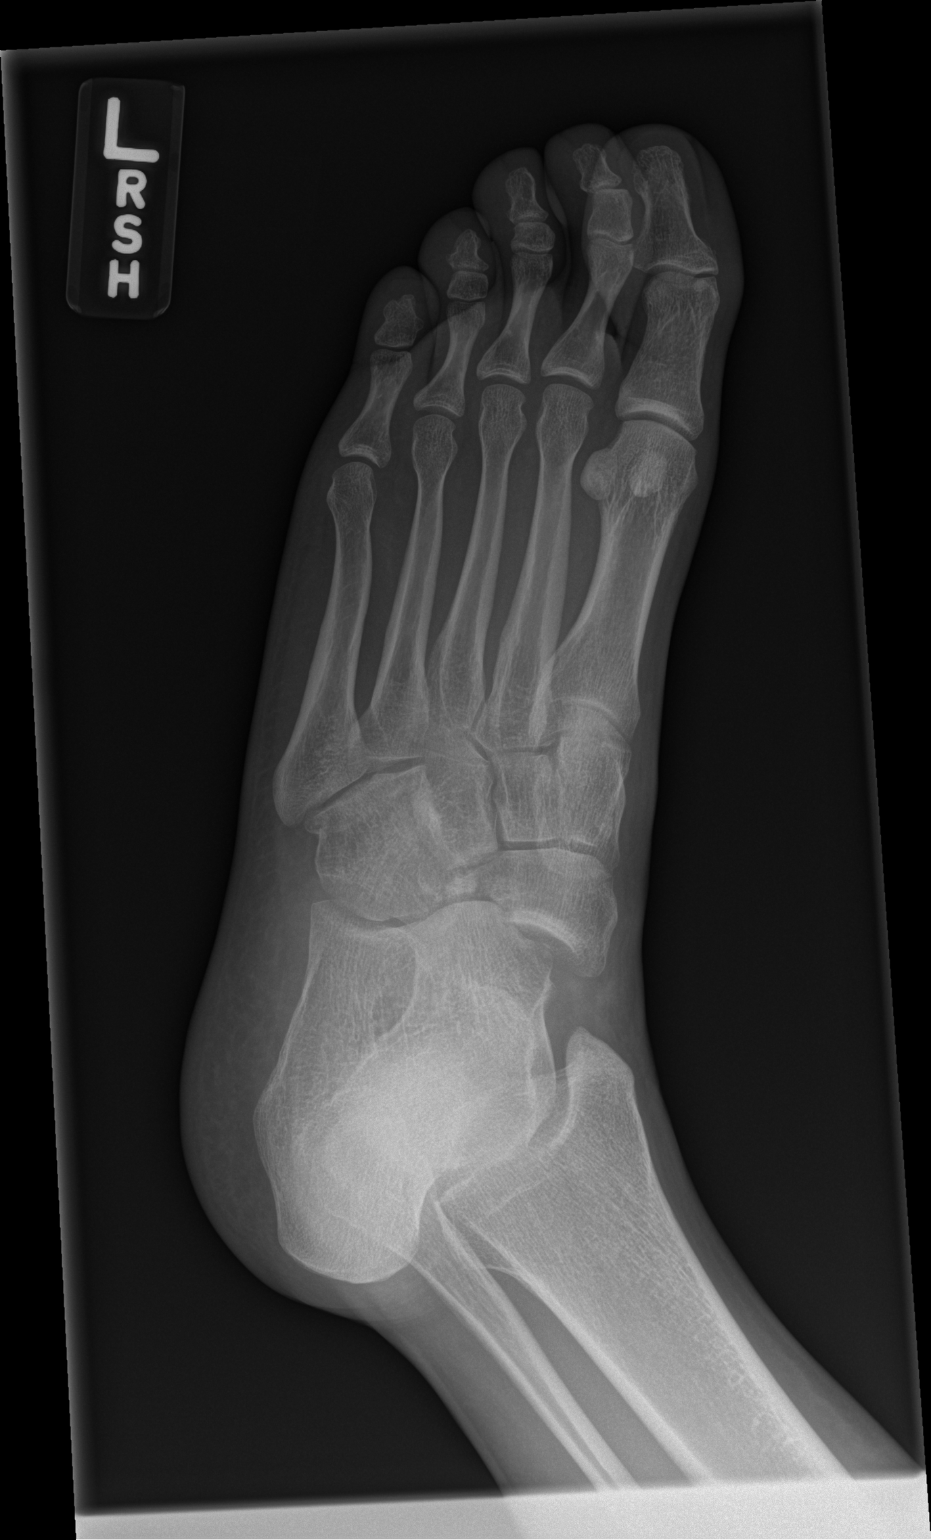

[x foot lat left]
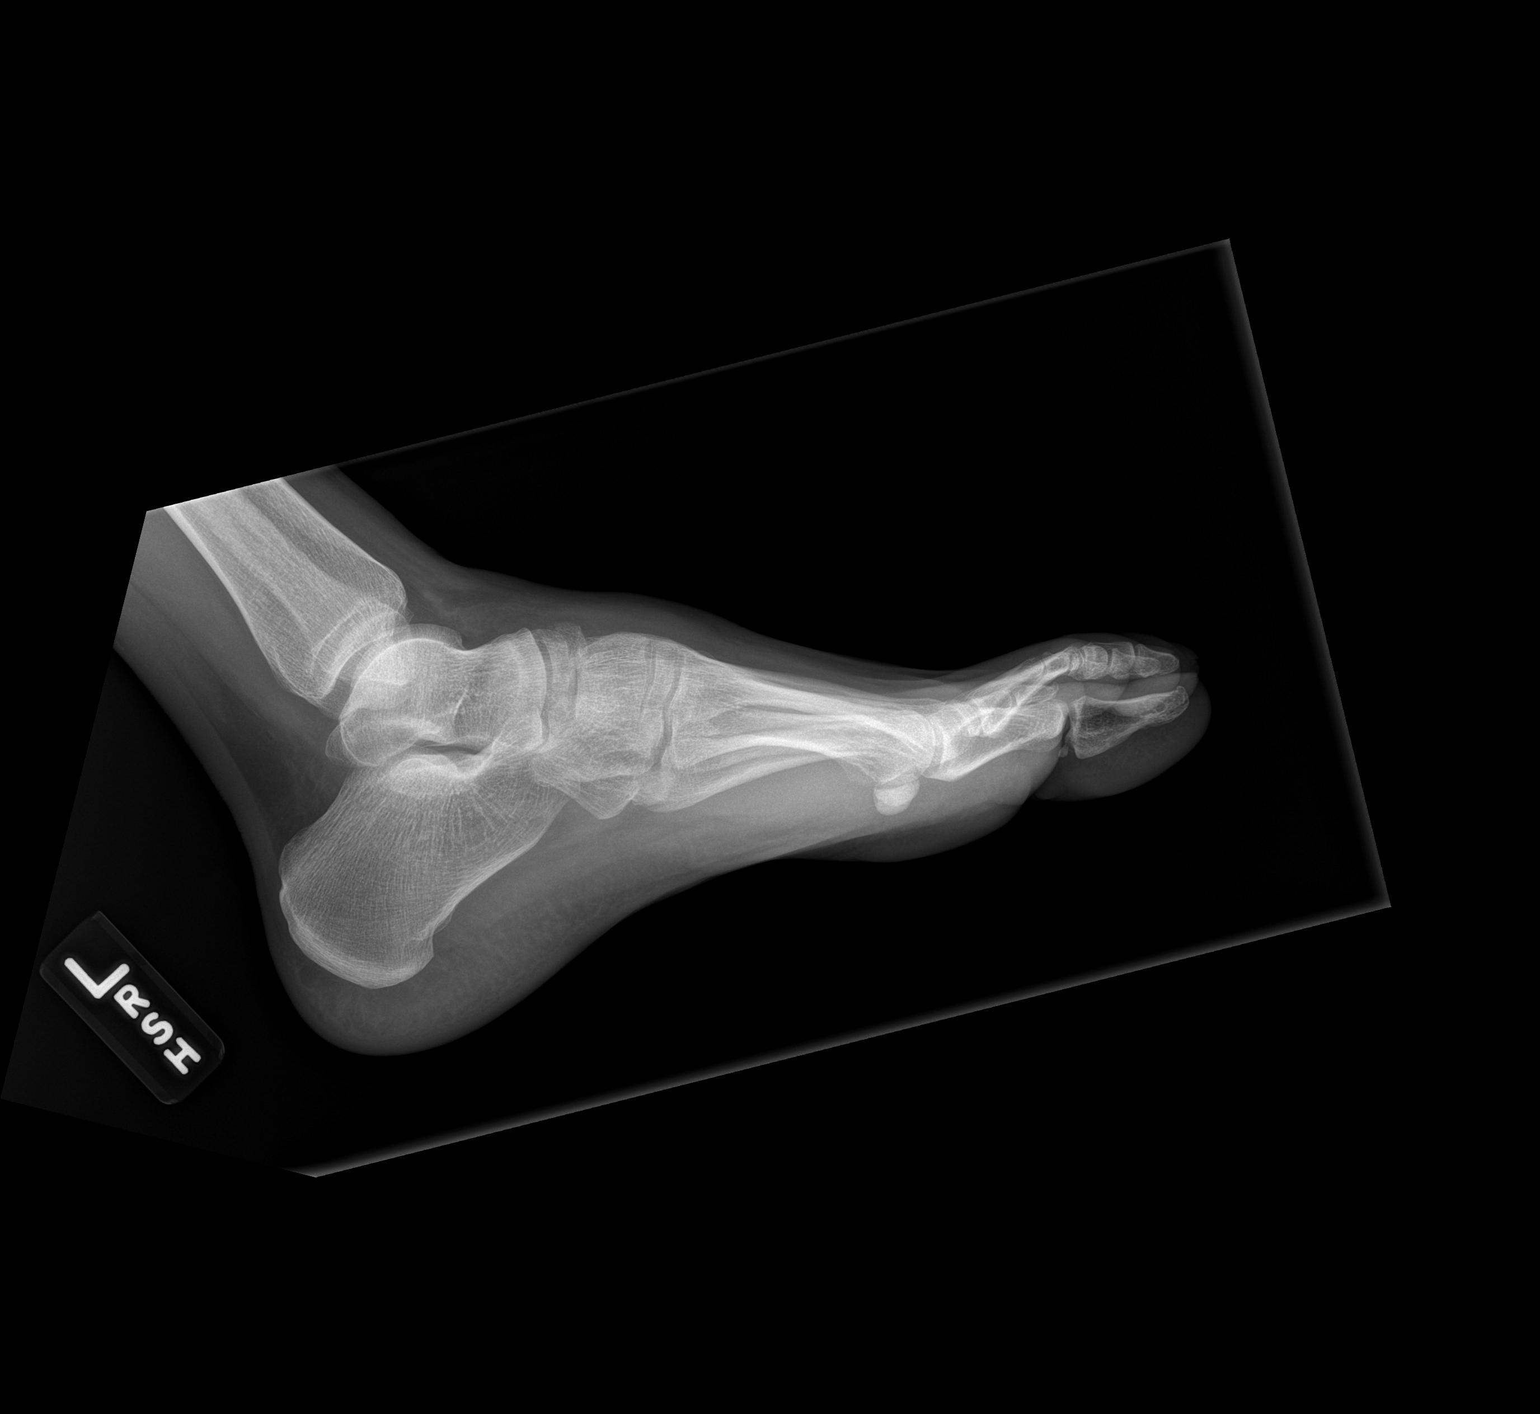

[3 of 3 positions shown; findings below may reference images not displayed]

FINDINGS: There is some irregularity of the lateral portion of the tarsal
navicula and fracture cannot be excluded. On the lateral view there
is a small bony fragment over the dorsal aspect apparently in the
region of the tarsal navicula and fracture is a definite
consideration. CT of the left foot is recommended. No additional
fracture is seen. Joint spaces appear normal. No erosion is noted.
IMPRESSION: Suspect fracture of the tarsal navicula laterally. Recommend CT of
the left foot to assess further.

## 2020-03-12 ENCOUNTER — Other Ambulatory Visit: Payer: Self-pay

## 2020-03-12 ENCOUNTER — Emergency Department (HOSPITAL_COMMUNITY): Admission: EM | Admit: 2020-03-12 | Discharge: 2020-03-12 | Discharge status: Absent without leave | Payer: Self-pay

## 2020-03-12 ENCOUNTER — Ambulatory Visit (HOSPITAL_COMMUNITY)
Admission: EM | Admit: 2020-03-12 | Discharge: 2020-03-12 | Disposition: A | Discharge status: Home / Self Care | Payer: HRSA Program | Attending: Family Medicine | Admitting: Family Medicine

## 2020-03-12 ENCOUNTER — Encounter (HOSPITAL_COMMUNITY): Payer: Self-pay | Admitting: Emergency Medicine

## 2020-03-12 DIAGNOSIS — R112 Nausea with vomiting, unspecified: Secondary | ICD-10-CM | POA: Diagnosis not present

## 2020-03-12 DIAGNOSIS — Z20822 Contact with and (suspected) exposure to covid-19: Secondary | ICD-10-CM | POA: Diagnosis present

## 2020-03-12 DIAGNOSIS — R197 Diarrhea, unspecified: Secondary | ICD-10-CM | POA: Insufficient documentation

## 2020-03-12 MED ORDER — ONDANSETRON 4 MG PO TBDP: 4.0000 mg | ORAL_TABLET | Freq: Three times a day (TID) | ORAL | 0 refills | Status: DC | PRN

## 2020-03-12 NOTE — ED Triage Notes (Signed)
Pt c/o COVID exposure yesterday. Has had sharp abd pain and diarrhea. N/V present as well. States vomited once this AM. Denies fever.

## 2020-03-12 NOTE — Discharge Instructions (Signed)
Covid test pending, monitor my chart for results Use Zofran as needed for nausea/vomiting Focus on fluids, avoid spicy and greasy foods May use Imodium for diarrhea as needed Follow-up if not improving or worsening

## 2020-03-13 LAB — SARS CORONAVIRUS 2 (TAT 6-24 HRS): SARS Coronavirus 2: NEGATIVE

## 2020-03-13 NOTE — ED Provider Notes (Signed)
[facility]    CSN: [id number] Arrival date & time: 03/12/20  1346      History   Chief Complaint Chief Complaint  Patient presents with  . Covid Exposure  . Abdominal Pain    HPI Aaron Fry is a 29 y.o. male presenting today for evaluation of abdominal pain nausea vomiting and covid exposure. He reports he has had nausea, vomiting and diarrhea over the past week. Recently found out he had close COVID exposure at work prompting visit. Abdominal pain waxes and wanes with diarrhea. Denies URI symptoms. Denies fevers.    HPI  History reviewed. No pertinent past medical history.  Patient Active Problem List   Diagnosis Date Noted  . Displaced fracture of cuboid bone of left foot, initial encounter for closed fracture 04/28/2018  . Multiple closed fractures of left foot   . Displaced fracture of navicular (scaphoid) of right foot, initial encounter for closed fracture   . Tobacco abuse counseling     Past Surgical History:  Procedure Laterality Date  . EXTERNAL FIXATION LEG Left 04/29/2018   Procedure: EXTERNAL FIXATION LEG;  Surgeon: Nadara Mustard, MD;  Location: [facility] OR;  Service: Orthopedics;  Laterality: Left;  . ORIF ANKLE FRACTURE Left 04/29/2018   Procedure: OPEN REDUCTION INTERNAL FIXATION (ORIF) LEFT NAVICULAR AND EXTERNAL FIXATION;  Surgeon: Nadara Mustard, MD;  Location[facility] OR;  Service: Orthopedics;  Laterality: Left;       Home Medications    Prior to Admission medications   Medication Sig Start Date End Date Taking? Authorizing Provider  ondansetron (ZOFRAN ODT) 4 MG disintegrating tablet Take 1 tablet (4 mg total) by mouth every 8 (eight) hours as needed for nausea or vomiting. 03/12/20   Kanesha Cadle C, PA-C  famotidine (PEPCID) 20 MG tablet Take 1 tablet (20 mg total) by mouth 2 (two) times daily. 05/30/19 03/12/20  Gilda Crease, MD    Family History Family History  Family history unknown: Yes    Social History Social  History   Tobacco Use  . Smoking status: Current Every Day Smoker    Packs/day: 0.50    Types: Cigarettes  . Smokeless tobacco: Never Used  Substance Use Topics  . Alcohol use: Not Currently    Comment: rarely  . Drug use: Yes    Types: Marijuana     Allergies   Patient has no known allergies.   Review of Systems Review of Systems  Constitutional: Negative for activity change, appetite change, chills, fatigue and fever.  HENT: Negative for congestion, ear pain, rhinorrhea, sinus pressure, sore throat and trouble swallowing.   Eyes: Negative for discharge and redness.  Respiratory: Negative for cough, chest tightness and shortness of breath.   Cardiovascular: Negative for chest pain.  Gastrointestinal: Positive for abdominal pain, diarrhea, nausea and vomiting.  Musculoskeletal: Negative for myalgias.  Skin: Negative for rash.  Neurological: Negative for dizziness, light-headedness and headaches.     Physical Exam Triage Vital Signs ED Triage Vitals  Enc Vitals Group     BP 03/12/20 1540 113/71     Pulse Rate 03/12/20 1540 68     Resp 03/12/20 1540 16     Temp 03/12/20 1540 98.1 F (36.7 C)     Temp Source 03/12/20 1540 Oral     SpO2 03/12/20 1540 99 %     Weight --      Height --      Head Circumference --      Peak Flow --  Pain Score 03/12/20 1538 6     Pain Loc --      Pain Edu? --      Excl. in GC? --    No data found.  Updated Vital Signs BP 113/71 (BP Location: Right Arm)   Pulse 68   Temp 98.1 F (36.7 C) (Oral)   Resp 16   SpO2 99%   Visual Acuity Right Eye Distance:   Left Eye Distance:   Bilateral Distance:    Right Eye Near:   Left Eye Near:    Bilateral Near:     Physical Exam Vitals and nursing note reviewed.  Constitutional:      Appearance: He is well-developed.     Comments: No acute distress  HENT:     Head: Normocephalic and atraumatic.     Ears:     Comments: Bilateral ears without tenderness to palpation of  external auricle, tragus and mastoid, EAC's without erythema or swelling, TM's with good bony landmarks and cone of light. Non erythematous.     Nose: Nose normal.     Mouth/Throat:     Comments: Oral mucosa pink and moist, no tonsillar enlargement or exudate. Posterior pharynx patent and nonerythematous, no uvula deviation or swelling. Normal phonation.  Eyes:     Conjunctiva/sclera: Conjunctivae normal.  Cardiovascular:     Rate and Rhythm: Normal rate and regular rhythm.  Pulmonary:     Effort: Pulmonary effort is normal. No respiratory distress.     Comments: Breathing comfortably at rest, CTABL, no wheezing, rales or other adventitious sounds auscultated  Abdominal:     General: There is no distension.     Comments: nontender to light and deep palpation throughout abdomen Negative rebound   Musculoskeletal:        General: Normal range of motion.     Cervical back: Neck supple.     Comments: Moves from chair to exam table/ supine without appearing in pain  Skin:    General: Skin is warm and dry.  Neurological:     Mental Status: He is alert and oriented to person, place, and time.      UC Treatments / Results  Labs (all labs ordered are listed, but only abnormal results are displayed) Labs Reviewed  SARS CORONAVIRUS 2 (TAT 6-24 HRS)    EKG   Radiology No results found.  Procedures Procedures (including critical care time)  Medications Ordered in UC Medications - No data to display  Initial Impression / Assessment and Plan / UC Course  I have reviewed the triage vital signs and the nursing notes.  Pertinent labs & imaging results that were available during my care of the patient were reviewed by me and considered in my medical decision making (see chart for details).     COVID test pending. Negative periotneal signs, low suspicion of abdominal emergency, recommending symptomatic and supportive care. Zofran for nausea, monitor for improvement in stools with  increased oral intake.   Discussed strict return precautions. Patient verbalized understanding and is agreeable with plan.  Final Clinical Impressions(s) / UC Diagnoses   Final diagnoses:  Nausea vomiting and diarrhea  Exposure to COVID-19 virus     Discharge Instructions     Covid test pending, monitor my chart for results Use Zofran as needed for nausea/vomiting Focus on fluids, avoid spicy and greasy foods May use Imodium for diarrhea as needed Follow-up if not improving or worsening    ED Prescriptions    Medication Sig Dispense Auth.  Provider   ondansetron (ZOFRAN ODT) 4 MG disintegrating tablet Take 1 tablet (4 mg total) by mouth every 8 (eight) hours as needed for nausea or vomiting. 20 tablet Bridget Westbrooks, [place] C, PA-C     PDMP not reviewed this encounter.   Lew Dawes, PA-C 03/13/20 1759

## 2020-05-23 ENCOUNTER — Other Ambulatory Visit: Payer: Self-pay

## 2020-05-23 ENCOUNTER — Ambulatory Visit (HOSPITAL_COMMUNITY)
Admission: EM | Admit: 2020-05-23 | Discharge: 2020-05-23 | Disposition: A | Discharge status: Home / Self Care | Payer: Self-pay | Attending: Student | Admitting: Student

## 2020-05-23 ENCOUNTER — Encounter (HOSPITAL_COMMUNITY): Payer: Self-pay

## 2020-05-23 DIAGNOSIS — M940 Chondrocostal junction syndrome [Tietze]: Secondary | ICD-10-CM

## 2020-05-23 DIAGNOSIS — R112 Nausea with vomiting, unspecified: Secondary | ICD-10-CM

## 2020-05-23 MED ORDER — ONDANSETRON 8 MG PO TBDP: 8.0000 mg | ORAL_TABLET | Freq: Three times a day (TID) | ORAL | 0 refills | Status: DC | PRN

## 2020-05-23 MED ORDER — IBUPROFEN 800 MG PO TABS: 800.0000 mg | ORAL_TABLET | Freq: Three times a day (TID) | ORAL | 0 refills | Status: DC

## 2020-05-23 NOTE — ED Triage Notes (Signed)
Pt presents to UC today with c/o nausea, sob, and headaches since last night. Pain score 6/ 0-10 scale. Pt denies taking otc meds. VS are wdl. Pt denies being in contact with anyone ill and also denies any travel recently. Pt is fully vaccinated for covid.

## 2020-05-23 NOTE — ED Provider Notes (Signed)
[facility]    CSN: [id number] Arrival date & time: 05/23/20  1108      History   Chief Complaint Chief Complaint  Patient presents with  . Shortness of Breath  . Headache  . Nausea    HPI Aaron Fry is a 29 y.o. male presenting with vomiting, shortness of breath, and headaches. He has not taken any OTC medications for his symptoms. He has been seen by this urgent care and the [facility] ED multiple times for similar symptoms. -He has felt nauseous for the last 12 hours and has vomited 4 times, the last time being this morning at 8am (6 hours ago). Since then, he has kept down food and is hydrating well. Nausea seems to be getting better on its own. Denies abd pain, denies diarrhea, having normal bowel movements, last bowel movement was 1 day ago and was normal. History of acid reflux. Denies sick contacts or recent travel, and he is fully vaccinated for covid-19. -for headaches, he has these occasionally, though not today. When he has a headache, it causes 6/10 pain. deneis n/v/d, photophobia, weakness in arms/legs, sensation changes, worst headache of life.  -For shortness of breath, he endorses this for 3 days. States that when he takes a deep breath, his chest wall hurts. Hasn't identified any triggers, denies DOE. Denies trauma, denies pain shooting down arm, denies syncope, denies dizziness.     HPI  History reviewed. No pertinent past medical history.  Patient Active Problem List   Diagnosis Date Noted  . Displaced fracture of cuboid bone of left foot, initial encounter for closed fracture 04/28/2018  . Multiple closed fractures of left foot   . Displaced fracture of navicular (scaphoid) of right foot, initial encounter for closed fracture   . Tobacco abuse counseling     Past Surgical History:  Procedure Laterality Date  . EXTERNAL FIXATION LEG Left 04/29/2018   Procedure: EXTERNAL FIXATION LEG;  Surgeon: Nadara Mustard, MD;  Location: [facility] OR;  Service:  Orthopedics;  Laterality: Left;  . FOOT SURGERY     left  . ORIF ANKLE FRACTURE Left 04/29/2018   Procedure: OPEN REDUCTION INTERNAL FIXATION (ORIF) LEFT NAVICULAR AND EXTERNAL FIXATION;  Surgeon: Nadara Mustard, MD;  Location: [facility] OR;  Service: Orthopedics;  Laterality: Left;       Home Medications    Prior to Admission medications   Medication Sig Start Date End Date Taking? Authorizing Provider  ondansetron (ZOFRAN ODT) 4 MG disintegrating tablet Take 1 tablet (4 mg total) by mouth every 8 (eight) hours as needed for nausea or vomiting. 03/12/20   Wieters, Hallie C, PA-C  famotidine (PEPCID) 20 MG tablet Take 1 tablet (20 mg total) by mouth 2 (two) times daily. 05/30/19 03/12/20  Gilda Crease, MD    Family History Family History  Problem Relation Age of Onset  . Diabetes Mother     Social History Social History   Tobacco Use  . Smoking status: Current Every Day Smoker    Packs/day: 0.50    Types: Cigarettes  . Smokeless tobacco: Never Used  Vaping Use  . Vaping Use: Never used  Substance Use Topics  . Alcohol use: Not Currently    Comment: rarely  . Drug use: Yes    Types: Marijuana    Comment: rarely      Allergies   Patient has no known allergies.   Review of Systems Review of Systems  Constitutional: Negative for chills and fever.  HENT: Negative for congestion, ear pain, sinus pain and sore throat.   Eyes: Negative for photophobia and visual disturbance.  Respiratory: Positive for shortness of breath. Negative for cough, chest tightness and wheezing.   Cardiovascular: Negative for chest pain and leg swelling.  Gastrointestinal: Positive for nausea and vomiting. Negative for abdominal distention, abdominal pain, blood in stool, constipation and diarrhea.  Genitourinary: Negative for flank pain.  Neurological: Positive for headaches. Negative for syncope, weakness and numbness.  All other systems reviewed and are negative.    Physical  Exam Triage Vital Signs ED Triage Vitals  Enc Vitals Group     BP 05/23/20 1310 116/72     Pulse Rate 05/23/20 1110 71     Resp 05/23/20 1310 14     Temp 05/23/20 1310 98.8 F (37.1 C)     Temp Source 05/23/20 1310 Oral     SpO2 05/23/20 1110 98 %     Weight 05/23/20 1313 152 lb (68.9 kg)     Height 05/23/20 1313 5\' 9"  (1.753 m)     Head Circumference --      Peak Flow --      Pain Score 05/23/20 1313 6     Pain Loc --      Pain Edu? --      Excl. in GC? --    No data found.  Updated Vital Signs BP 116/72 (BP Location: Right Arm)   Pulse 63   Temp 98.8 F (37.1 C) (Oral)   Resp 14   Ht 5\' 9"  (1.753 m)   Wt 152 lb (68.9 kg)   SpO2 100%   BMI 22.45 kg/m   Visual Acuity Right Eye Distance:   Left Eye Distance:   Bilateral Distance:    Right Eye Near:   Left Eye Near:    Bilateral Near:     Physical Exam Vitals reviewed.  Constitutional:      General: He is not in acute distress.    Appearance: Normal appearance. He is not ill-appearing.  HENT:     Head: Normocephalic and atraumatic.     Right Ear: Hearing, tympanic membrane, ear canal and external ear normal. No swelling or tenderness. No middle ear effusion. There is no impacted cerumen. No mastoid tenderness. Tympanic membrane is not perforated, erythematous, retracted or bulging.     Left Ear: Hearing, tympanic membrane, ear canal and external ear normal. No swelling or tenderness.  No middle ear effusion. There is no impacted cerumen. No mastoid tenderness. Tympanic membrane is not perforated, erythematous, retracted or bulging.     Nose:     Right Sinus: No maxillary sinus tenderness or frontal sinus tenderness.     Left Sinus: No maxillary sinus tenderness or frontal sinus tenderness.     Mouth/Throat:     Pharynx: No oropharyngeal exudate, posterior oropharyngeal erythema or uvula swelling.     Tonsils: No tonsillar exudate.  Cardiovascular:     Rate and Rhythm: Normal rate and regular rhythm.     Heart  sounds: Normal heart sounds.  Pulmonary:     Effort: Pulmonary effort is normal. No respiratory distress.     Breath sounds: Normal breath sounds and air entry. No decreased breath sounds, wheezing, rhonchi or rales.  Chest:     Comments: Chest wall tender to palpation along left sternal border Abdominal:     General: Abdomen is flat. Bowel sounds are normal. There is no distension.     Palpations: Abdomen is soft. There is  no mass.     Tenderness: There is no abdominal tenderness. There is no right CVA tenderness, left CVA tenderness, guarding or rebound. Negative signs include Murphy's sign, Rovsing's sign and McBurney's sign.     Comments: Bowel sounds positive in all 4 quadrants. No tenderness to palpation. Negative Murphy Sign, Rovsing's sign, McBurney point tenderness.   Neurological:     General: No focal deficit present.     Mental Status: He is alert and oriented to person, place, and time.     Cranial Nerves: Cranial nerves are intact.  Psychiatric:        Mood and Affect: Mood normal.        Behavior: Behavior normal.      UC Treatments / Results  Labs (all labs ordered are listed, but only abnormal results are displayed) Labs Reviewed - No data to display  EKG   Radiology No results found.  Procedures Procedures (including critical care time)  Medications Ordered in UC Medications - No data to display  Initial Impression / Assessment and Plan / UC Course  I have reviewed the triage vital signs and the nursing notes.  Pertinent labs & imaging results that were available during my care of the patient were reviewed by me and considered in my medical decision making (see chart for details).     I have reviewed the triage vital signs and the nursing notes.  Patient has been worked up for these symptoms multiple times in [place] ED and this urgent care.   Low suspicion of abd emergency today. zofran for nausea. Continue good hydration. Return precautions-  worsening abd pain, fevers/chills, etc.   For "chest pain"- pain is reproducible and consistent with costochondritis. Denies radiation of pain down arm, no history of cardiac disease. Return precautions- chest pain at rest, radiation of pain down arms, jaw pain, worsening shortness of breath at rest, etc. Patient verbalizes understanding and agreement.   Discussed that patient should establish care with PCP for better control of these ongoign medical complaints.   Final Clinical Impressions(s) / UC Diagnoses   Final diagnoses:  None   Discharge Instructions   None    ED Prescriptions    None     PDMP not reviewed this encounter.   Rhys Martini, PA-C 05/23/20 1413

## 2020-05-23 NOTE — ED Notes (Signed)
Patient checked in stating he was vomiting and having shortness of breath.  Patient speaking in clear complete sentences, NAD in lobby.  SpO2 98% pulse 71.

## 2020-05-23 NOTE — Discharge Instructions (Signed)
For your rib pain and headaches, take ibuprofen as needed. Make sure to take ibuprofen with food. For your nausea, you can try zofran. If you continue to have these symptoms, it is a great idea to establish care with a primary care provider, who can coordinate your care and refer to specialists if you need.

## 2020-06-07 ENCOUNTER — Ambulatory Visit (INDEPENDENT_AMBULATORY_CARE_PROVIDER_SITE_OTHER): Payer: Self-pay

## 2020-06-07 ENCOUNTER — Other Ambulatory Visit: Payer: Self-pay

## 2020-06-07 ENCOUNTER — Ambulatory Visit (HOSPITAL_COMMUNITY)
Admission: EM | Admit: 2020-06-07 | Discharge: 2020-06-07 | Disposition: A | Discharge status: Home / Self Care | Payer: Self-pay | Attending: Family Medicine | Admitting: Family Medicine

## 2020-06-07 ENCOUNTER — Encounter (HOSPITAL_COMMUNITY): Payer: Self-pay | Admitting: *Deleted

## 2020-06-07 DIAGNOSIS — R0789 Other chest pain: Secondary | ICD-10-CM

## 2020-06-07 DIAGNOSIS — R079 Chest pain, unspecified: Secondary | ICD-10-CM

## 2020-06-07 MED ORDER — NAPROXEN 500 MG PO TABS: 500.0000 mg | ORAL_TABLET | Freq: Two times a day (BID) | ORAL | 0 refills | Status: DC

## 2020-06-07 MED ORDER — PREDNISONE 20 MG PO TABS: 40.0000 mg | ORAL_TABLET | Freq: Every day | ORAL | 0 refills | Status: DC

## 2020-06-07 NOTE — ED Provider Notes (Signed)
[facility]   [id number] 06/07/20 Arrival Time: 1100  ASSESSMENT & PLAN:  1. Chest wall pain     Patient history and exam consistent with non-cardiac cause of chest pain. Conservative measures indicated.  ECG: Performed today and interpreted by me: normal EKG, normal sinus rhythm. No STEMi.  I have personally viewed the imaging studies ordered this visit. Normal CXR.  Begin trial of: Meds ordered this encounter  Medications  . naproxen (NAPROSYN) 500 MG tablet    Sig: Take 1 tablet (500 mg total) by mouth 2 (two) times daily with a meal.    Dispense:  20 tablet    Refill:  0  . predniSONE (DELTASONE) 20 MG tablet    Sig: Take 2 tablets (40 mg total) by mouth daily.    Dispense:  10 tablet    Refill:  0    Chest pain precautions given. Work note provided. Reviewed expectations re: course of current medical issues. Questions answered. Outlined signs and symptoms indicating need for more acute intervention. Patient verbalized understanding. After Visit Summary given.   SUBJECTIVE:  History from: patient. Aaron Fry is a 29 y.o. male who presents with complaint of intermittent upper chest discomfort described as dull that does not radiate. Onset gradual, first noted over past week; worse yesterday/today. Reports these symptoms are stable since beginning. Without associated n/v; without associated SOB. Injury or recent strenuous activity: none. Works in [employer] and questions relation. Denies: dyspnea, exertional chest pressure/discomfort, fatigue, irregular heart beat, lower extremity edema, near-syncope, orthopnea, palpitations, paroxysmal nocturnal dyspnea and syncope. Aggravating factors: include certain movements. Alleviating factors: have not been identified. Recent illnesses: none. Fever: absent. Ambulatory without assistance. Illicit drug use: none.  Social History   Tobacco Use  Smoking Status Current Every Day Smoker  . Packs/day: 0.50  .  Types: Cigarettes  Smokeless Tobacco Never Used   Social History   Substance and Sexual Activity  Alcohol Use Not Currently   Comment: rarely    OBJECTIVE:  Vitals:   06/07/20 1316  BP: 110/61  Pulse: 64  Resp: 16  Temp: 98.2 F (36.8 C)  TempSrc: Oral  SpO2: 98%    General appearance: alert, oriented, no acute distress Eyes: PERRLA; EOMI; conjunctivae normal HENT: normocephalic; atraumatic Neck: supple with FROM Lungs: without labored respirations; speaks full sentences without difficulty; CTAB Heart: regular Chest Wall: with upper tenderness to palpation Abdomen: soft, non-tender; no guarding or rebound tenderness Extremities: without edema; without calf swelling or tenderness; symmetrical without gross deformities Skin: warm and dry; without rash or lesions Neuro: normal gait Psychological: alert and cooperative; normal mood and affect   Imaging: DG Chest 2 View  Result Date: 06/07/2020 CLINICAL DATA:  Chest pain EXAM: CHEST - 2 VIEW COMPARISON:  May 29, 2019 FINDINGS: Lungs are clear. Heart size and pulmonary vascularity are normal. No adenopathy. No pneumothorax. No bone lesions. Monitor lead on left. IMPRESSION: Lungs clear.  Cardiac silhouette normal. Electronically Signed   By: Bretta Bang III M.D.   On: 06/07/2020 13:49     No Known Allergies  History reviewed. No pertinent past medical history. Social History   Socioeconomic History  . Marital status: Single    Spouse name: Not on file  . Number of children: Not on file  . Years of education: Not on file  . Highest education level: Not on file  Occupational History  . Not on file  Tobacco Use  . Smoking status: Current Every Day Smoker  Packs/day: 0.50    Types: Cigarettes  . Smokeless tobacco: Never Used  Vaping Use  . Vaping Use: Never used  Substance and Sexual Activity  . Alcohol use: Not Currently    Comment: rarely  . Drug use: Yes    Types: Marijuana    Comment: rarely    . Sexual activity: Not Currently  Other Topics Concern  . Not on file  Social History Narrative  . Not on file   Social Determinants of Health   Financial Resource Strain: Not on file  Food Insecurity: Not on file  Transportation Needs: Not on file  Physical Activity: Not on file  Stress: Not on file  Social Connections: Not on file  Intimate Partner Violence: Not on file   Family History  Problem Relation Age of Onset  . Diabetes Mother    Past Surgical History:  Procedure Laterality Date  . EXTERNAL FIXATION LEG Left 04/29/2018   Procedure: EXTERNAL FIXATION LEG;  Surgeon: Nadara Mustard, MD;  Location: [facility] OR;  Service: Orthopedics;  Laterality: Left;  . FOOT SURGERY     left  . ORIF ANKLE FRACTURE Left 04/29/2018   Procedure: OPEN REDUCTION INTERNAL FIXATION (ORIF) LEFT NAVICULAR AND EXTERNAL FIXATION;  Surgeon: Nadara Mustard, MD;  Location: [facility] OR;  Service: Orthopedics;  Laterality: Left;     Mardella Layman, MD 06/07/20 1430

## 2020-06-07 NOTE — Discharge Instructions (Signed)
You have been seen at the [facility] today for chest pain. Your evaluation today was not suggestive of any emergent condition requiring medical intervention at this time. Your ECG (heart tracing) did not show any worrisome changes. However, some medical problems make take more time to appear. Therefore, it's very important that you pay attention to any new symptoms or worsening of your current condition.  Please proceed directly to the Emergency Department immediately should you feel worse in any way or have any of the following symptoms: increasing or different chest pain, pain that spreads to your arm, neck, jaw, back or abdomen, shortness of breath, or nausea and vomiting.  

## 2020-06-07 NOTE — ED Triage Notes (Signed)
Pt present to UC with  Reports of central  Chest pain that radiates to Lt side . Pt also reports the pain increases when he bends over.

## 2020-11-21 IMAGING — DX DG CHEST 2V
2 series · 2 of 2 positions shown · non-contrast
Comparison: June 11, 2014.

CLINICAL DATA: Chest pain.

EXAM:
CHEST - 2 VIEW

[chest pa]
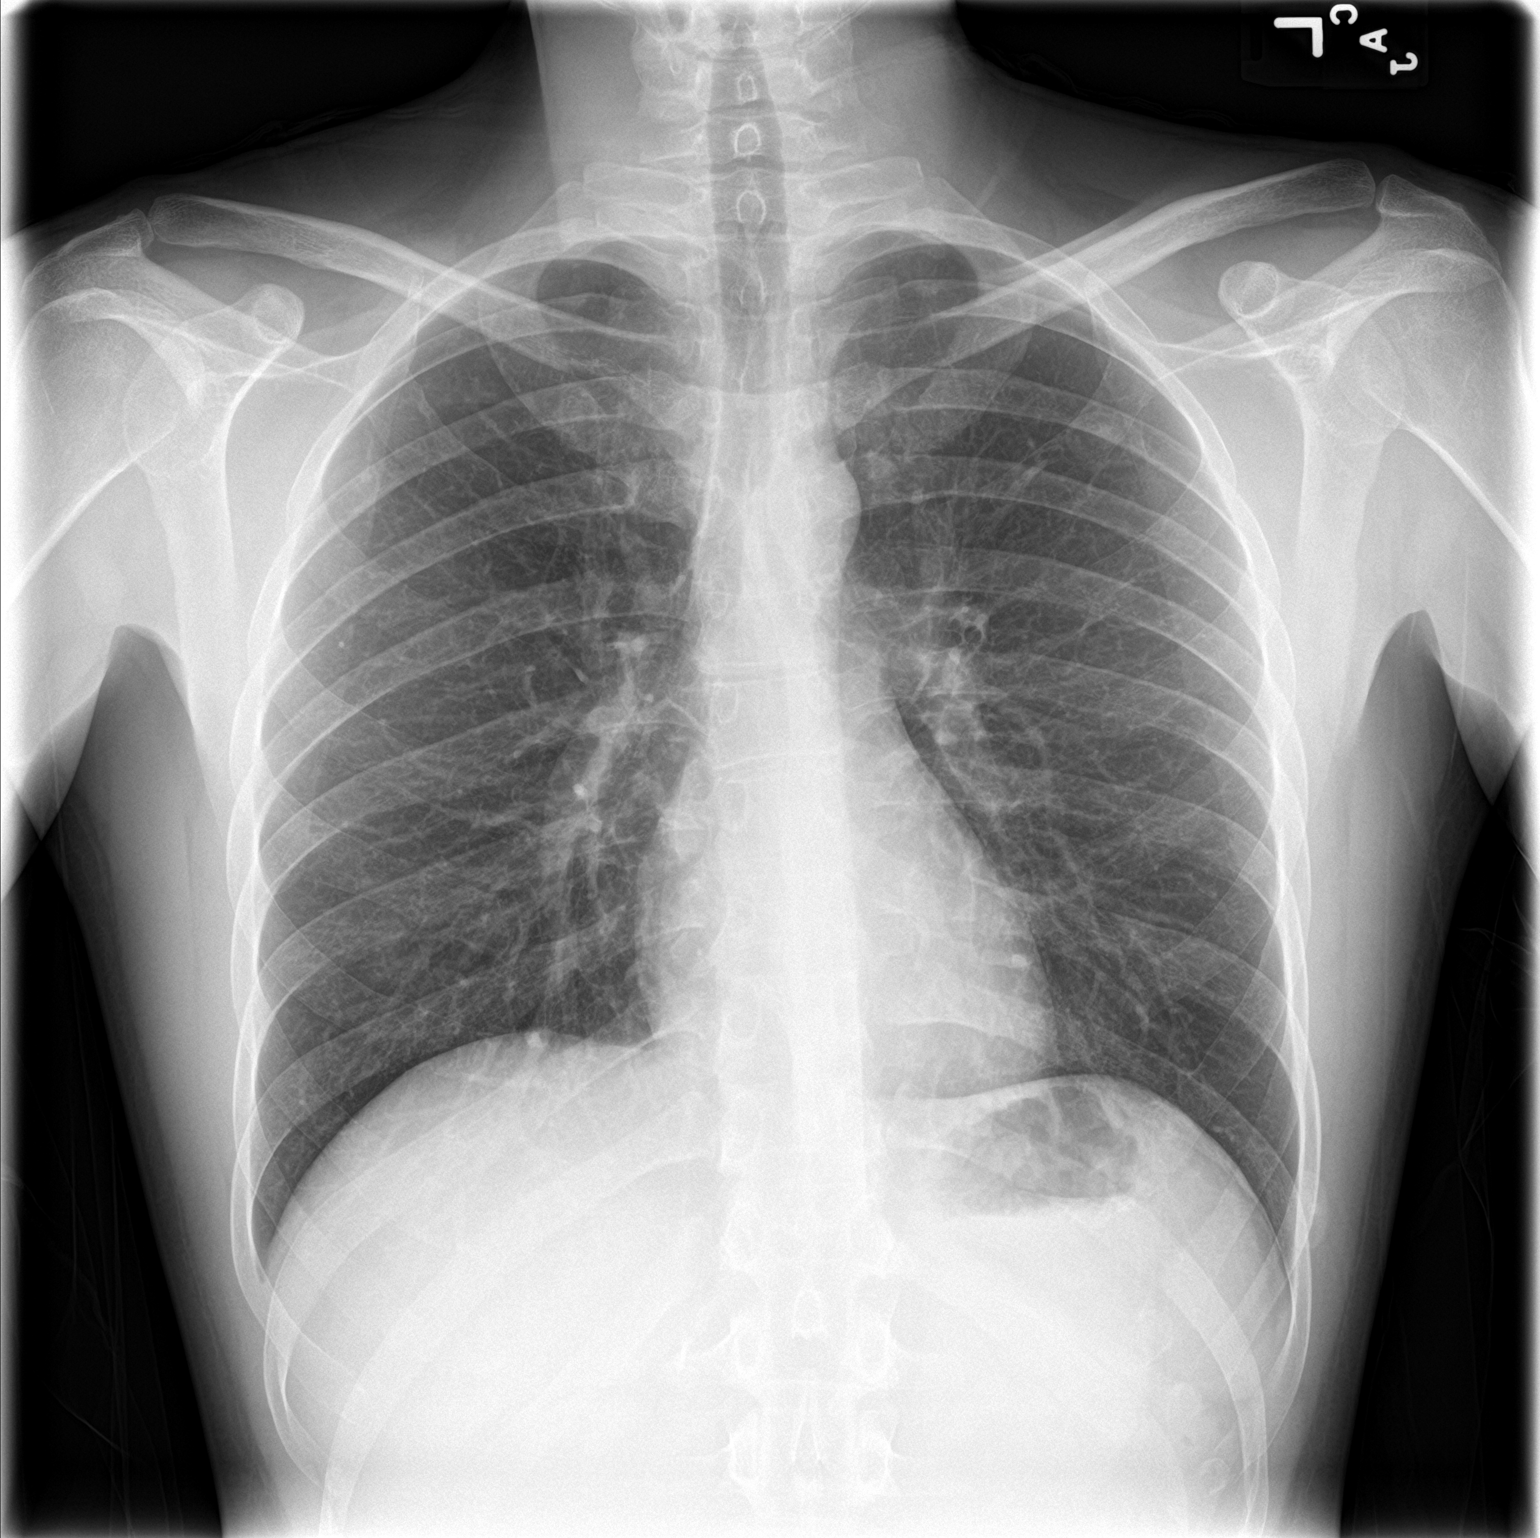

[chest lat]
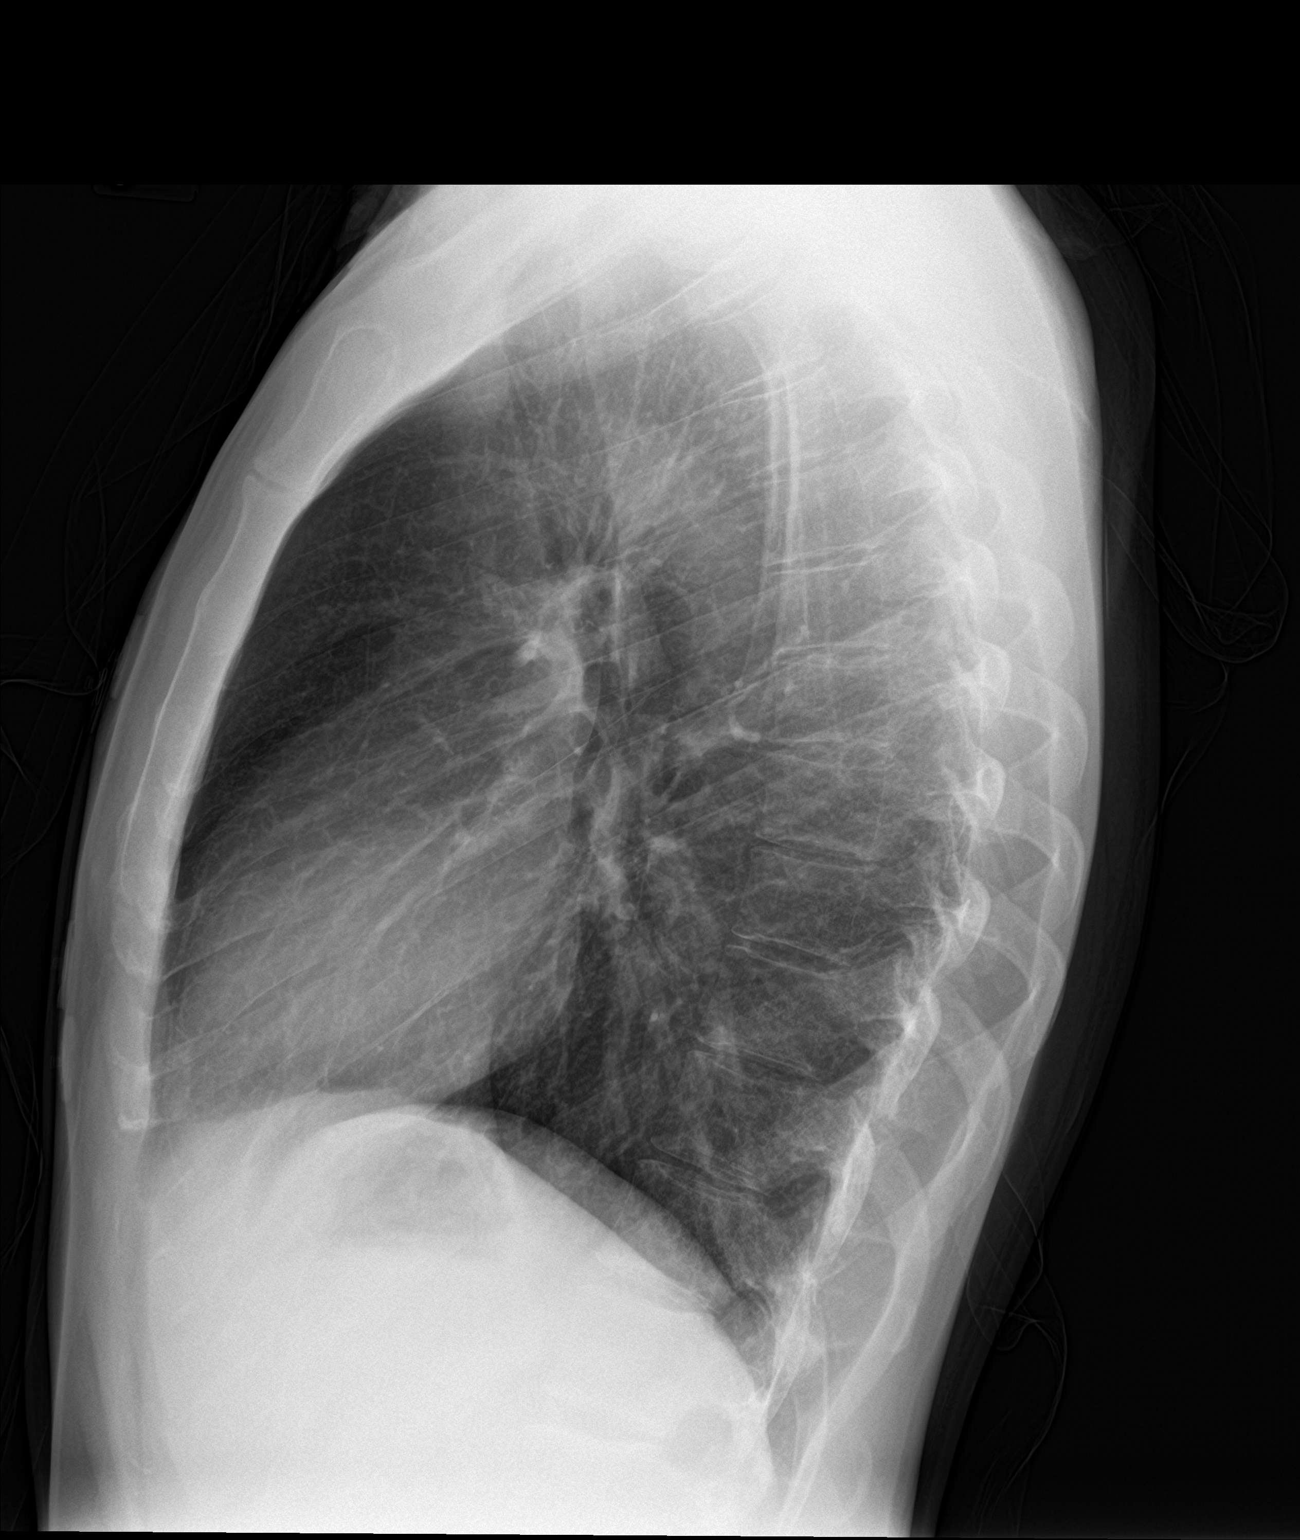

[2 of 2 positions shown; findings below may reference images not displayed]

FINDINGS: The heart size and mediastinal contours are within normal limits.
Both lungs are clear. The visualized skeletal structures are
unremarkable.
IMPRESSION: No active cardiopulmonary disease.

## 2021-03-02 ENCOUNTER — Encounter (HOSPITAL_COMMUNITY): Payer: Self-pay

## 2021-03-02 ENCOUNTER — Emergency Department (HOSPITAL_COMMUNITY): Payer: Self-pay

## 2021-03-02 ENCOUNTER — Emergency Department (HOSPITAL_COMMUNITY)
Admission: EM | Admit: 2021-03-02 | Discharge: 2021-03-03 | Disposition: A | Discharge status: Home / Self Care | Payer: Self-pay | Attending: Emergency Medicine | Admitting: Emergency Medicine

## 2021-03-02 ENCOUNTER — Other Ambulatory Visit: Payer: Self-pay

## 2021-03-02 DIAGNOSIS — J029 Acute pharyngitis, unspecified: Secondary | ICD-10-CM | POA: Insufficient documentation

## 2021-03-02 DIAGNOSIS — R202 Paresthesia of skin: Secondary | ICD-10-CM | POA: Insufficient documentation

## 2021-03-02 DIAGNOSIS — Z20822 Contact with and (suspected) exposure to covid-19: Secondary | ICD-10-CM | POA: Insufficient documentation

## 2021-03-02 DIAGNOSIS — F17210 Nicotine dependence, cigarettes, uncomplicated: Secondary | ICD-10-CM | POA: Insufficient documentation

## 2021-03-02 DIAGNOSIS — R509 Fever, unspecified: Secondary | ICD-10-CM

## 2021-03-02 DIAGNOSIS — M549 Dorsalgia, unspecified: Secondary | ICD-10-CM | POA: Insufficient documentation

## 2021-03-02 DIAGNOSIS — E860 Dehydration: Secondary | ICD-10-CM | POA: Insufficient documentation

## 2021-03-02 LAB — URINALYSIS, ROUTINE W REFLEX MICROSCOPIC
Bilirubin Urine: NEGATIVE
Glucose, UA: NEGATIVE mg/dL
Hgb urine dipstick: NEGATIVE
Ketones, ur: 80 mg/dL — AB
Leukocytes,Ua: NEGATIVE
Nitrite: NEGATIVE
Protein, ur: NEGATIVE mg/dL
Specific Gravity, Urine: 1.02 (ref 1.005–1.030)
pH: 5 (ref 5.0–8.0)

## 2021-03-02 LAB — COMPREHENSIVE METABOLIC PANEL
ALT: 15 U/L (ref 0–44)
AST: 22 U/L (ref 15–41)
Albumin: 4.1 g/dL (ref 3.5–5.0)
Alkaline Phosphatase: 53 U/L (ref 38–126)
Anion gap: 11 (ref 5–15)
BUN: 5 mg/dL — ABNORMAL LOW (ref 6–20)
CO2: 27 mmol/L (ref 22–32)
Calcium: 8.9 mg/dL (ref 8.9–10.3)
Chloride: 96 mmol/L — ABNORMAL LOW (ref 98–111)
Creatinine, Ser: 0.96 mg/dL (ref 0.61–1.24)
GFR, Estimated: >60 mL/min (ref >60)
Glucose, Bld: 99 mg/dL (ref 70–99)
Potassium: 3.4 mmol/L — ABNORMAL LOW (ref 3.5–5.1)
Sodium: 134 mmol/L — ABNORMAL LOW (ref 135–145)
Total Bilirubin: 0.7 mg/dL (ref 0.3–1.2)
Total Protein: 7.3 g/dL (ref 6.5–8.1)

## 2021-03-02 LAB — CBC WITH DIFFERENTIAL/PLATELET
Abs Immature Granulocytes: 0.02 10*3/uL (ref 0.00–0.07)
Basophils Absolute: 0 10*3/uL (ref 0.0–0.1)
Basophils Relative: 0 %
Eosinophils Absolute: 0 10*3/uL (ref 0.0–0.5)
Eosinophils Relative: 0 %
HCT: 44.2 % (ref 39.0–52.0)
Hemoglobin: 14.8 g/dL (ref 13.0–17.0)
Immature Granulocytes: 0 %
Lymphocytes Relative: 27 %
Lymphs Abs: 1.7 10*3/uL (ref 0.7–4.0)
MCH: 31.4 pg (ref 26.0–34.0)
MCHC: 33.5 g/dL (ref 30.0–36.0)
MCV: 93.8 fL (ref 80.0–100.0)
Monocytes Absolute: 0.6 10*3/uL (ref 0.1–1.0)
Monocytes Relative: 10 %
Neutro Abs: 4.1 10*3/uL (ref 1.7–7.7)
Neutrophils Relative %: 63 %
Platelets: 172 10*3/uL (ref 150–400)
RBC: 4.71 MIL/uL (ref 4.22–5.81)
RDW: 12.6 % (ref 11.5–15.5)
WBC: 6.5 10*3/uL (ref 4.0–10.5)
nRBC: 0 % (ref 0.0–0.2)

## 2021-03-02 LAB — RESP PANEL BY RT-PCR (FLU A&B, COVID) ARPGX2
Influenza A by PCR: NEGATIVE
Influenza B by PCR: NEGATIVE
SARS Coronavirus 2 by RT PCR: NEGATIVE

## 2021-03-02 MED ORDER — SODIUM CHLORIDE 0.9 % IV BOLUS
1000.0000 mL | Freq: Once | INTRAVENOUS | Status: AC
  Administered 2021-03-03: 1000 mL via INTRAVENOUS

## 2021-03-02 MED ORDER — SODIUM CHLORIDE 0.9 % IV BOLUS
1000.0000 mL | Freq: Once | INTRAVENOUS | Status: AC
  Administered 2021-03-02: 1000 mL via INTRAVENOUS

## 2021-03-02 MED ORDER — KETOROLAC TROMETHAMINE 30 MG/ML IJ SOLN
15.0000 mg | Freq: Once | INTRAMUSCULAR | Status: AC
  Administered 2021-03-02: 15 mg via INTRAVENOUS
  Filled 2021-03-02: qty 1

## 2021-03-02 MED ORDER — ACETAMINOPHEN 500 MG PO TABS
1000.0000 mg | ORAL_TABLET | Freq: Once | ORAL | Status: AC
  Administered 2021-03-02: 1000 mg via ORAL
  Filled 2021-03-02: qty 2

## 2021-03-02 NOTE — ED Triage Notes (Signed)
Pt reports cold like symptoms for the past 4 days (sore throat, cough) and bilateral lower back pain. Pt denies any urinary s/s.

## 2021-03-02 NOTE — ED Provider Notes (Signed)
Emergency Medicine Provider Triage Evaluation Note  Aaron Fry , a 30 y.o. male  was evaluated in triage.  Pt complains of fevers, body aches, low back pain with numbness radiating down his lower legs, cough, nasal congestion, sore throat which has been ongoing for the last 2 days.  Patient is vaccinated but not boosted for COVID-19.  No known sick contacts.  Denies any history of IV drug use.  States that he is having back pain that he has never had before.  He has still been able to ambulate.  Review of Systems  Positive: As above Negative:   Physical Exam  BP 120/85 (BP Location: Left Arm)   Pulse 78   Temp (!) 102.5 F (39.2 C) (Oral)   Resp 18   SpO2 99%  Gen:   Awake, no distress   Resp:  Normal effort, lung sounds clear MSK:   Moves extremities without difficulty, midline tenderness of the lumbar spine, no step-offs or crepitus.  5/5 strength in upper lower extremities bilaterally    Medic, al Decision Making  Medically screening exam initiated at 6:06 PM.  Appropriate orders placed.  Aaron Fry was informed that the remainder of the evaluation will be completed by another provider, this initial triage assessment does not replace that evaluation, and the importance of remaining in the ED until their evaluation is complete.     Leone Brand 03/02/21 Chana Bode, MD 03/02/21 2101

## 2021-03-02 NOTE — ED Provider Notes (Signed)
[facility] EMERGENCY DEPARTMENT Provider Note   CSN: [id number] Arrival date & time: 03/02/21  1746     History Chief Complaint  Patient presents with   Sore Throat   Back Pain   Cough    Aaron Fry is a 30 y.o. male.  HPI Patient presents with soreness, fever, sore throat, no focal chest or abdominal pain.  He does have some back soreness, with tingling sensation in his legs.  Patient states that he is generally well aside from history of tobacco abuse, currently smoking about 1 pack/week. No clear onset, but about 3 days ago he began feeling ill.  Since that time, he is unaware fever until today, though he does note chills and feverishness. He received 2 COVID vaccines, no blisters.  No known sick contacts.  No IV drug use or any drug use.    History reviewed. No pertinent past medical history.  Patient Active Problem List   Diagnosis Date Noted   Displaced fracture of cuboid bone of left foot, initial encounter for closed fracture 04/28/2018   Multiple closed fractures of left foot    Displaced fracture of navicular (scaphoid) of right foot, initial encounter for closed fracture    Tobacco abuse counseling     Past Surgical History:  Procedure Laterality Date   EXTERNAL FIXATION LEG Left 04/29/2018   Procedure: EXTERNAL FIXATION LEG;  Surgeon: Nadara Mustard, MD;  Location: [facility] OR;  Service: Orthopedics;  Laterality: Left;   FOOT SURGERY     left   ORIF ANKLE FRACTURE Left 04/29/2018   Procedure: OPEN REDUCTION INTERNAL FIXATION (ORIF) LEFT NAVICULAR AND EXTERNAL FIXATION;  Surgeon: Nadara Mustard, MD;  Location: [facility] OR;  Service: Orthopedics;  Laterality: Left;       Family History  Problem Relation Age of Onset   Diabetes Mother     Social History   Tobacco Use   Smoking status: Every Day    Packs/day: 0.50    Types: Cigarettes   Smokeless tobacco: Never  Vaping Use   Vaping Use: Never used  Substance Use Topics   Alcohol use: Not  Currently    Comment: rarely   Drug use: Yes    Types: Marijuana    Comment: rarely     Home Medications Prior to Admission medications   Medication Sig Start Date End Date Taking? Authorizing Provider  naproxen (NAPROSYN) 500 MG tablet Take 1 tablet (500 mg total) by mouth 2 (two) times daily with a meal. 06/07/20   Mardella Layman, MD  ondansetron (ZOFRAN-ODT) 8 MG disintegrating tablet Take 1 tablet (8 mg total) by mouth every 8 (eight) hours as needed for nausea or vomiting. 05/23/20   Rhys Martini, PA-C  predniSONE (DELTASONE) 20 MG tablet Take 2 tablets (40 mg total) by mouth daily. 06/07/20   Mardella Layman, MD  famotidine (PEPCID) 20 MG tablet Take 1 tablet (20 mg total) by mouth 2 (two) times daily. 05/30/19 03/12/20  Gilda Crease, MD    Allergies    Patient has no known allergies.  Review of Systems   Review of Systems  Constitutional:        Per HPI, otherwise negative  HENT:         Per HPI, otherwise negative  Respiratory:         Per HPI, otherwise negative  Cardiovascular:        Per HPI, otherwise negative  Gastrointestinal:  Negative for vomiting.  Endocrine:  Negative aside from HPI  Genitourinary:        Neg aside from HPI   Musculoskeletal:        Per HPI, otherwise negative  Skin: Negative.   Allergic/Immunologic: Negative for immunocompromised state.  Neurological:  Negative for syncope.   Physical Exam Updated Vital Signs BP 114/68   Pulse 64   Temp (!) 102.5 F (39.2 C) (Oral)   Resp 18   SpO2 98%   Physical Exam Vitals and nursing note reviewed.  Constitutional:      General: He is not in acute distress.    Appearance: He is well-developed.  HENT:     Head: Normocephalic and atraumatic.  Eyes:     Conjunctiva/sclera: Conjunctivae normal.  Cardiovascular:     Rate and Rhythm: Normal rate and regular rhythm.  Pulmonary:     Effort: Pulmonary effort is normal. No respiratory distress.     Breath sounds: No stridor.   Abdominal:     General: There is no distension.  Skin:    General: Skin is warm and dry.     Comments: Multiple tattoos  Neurological:     Mental Status: He is alert and oriented to person, place, and time.     Cranial Nerves: Cranial nerves are intact. No dysarthria.     Motor: No weakness, tremor, atrophy or abnormal muscle tone.    ED Results / Procedures / Treatments   Labs (all labs ordered are listed, but only abnormal results are displayed) Labs Reviewed  COMPREHENSIVE METABOLIC PANEL - Abnormal; Notable for the following components:      Result Value   Sodium 134 (*)    Potassium 3.4 (*)    Chloride 96 (*)    BUN 5 (*)    All other components within normal limits  RESP PANEL BY RT-PCR (FLU A&B, COVID) ARPGX2  CBC WITH DIFFERENTIAL/PLATELET    EKG None  Radiology DG Chest 2 View  Result Date: 03/02/2021 CLINICAL DATA:  Cough. Additional provided: Patient reports cough, body aches, sore throat, low back pain. EXAM: CHEST - 2 VIEW COMPARISON:  Prior chest radiographs 06/07/2020 and earlier. FINDINGS: Heart size within normal limits. No appreciable airspace consolidation. No evidence of pleural effusion or pneumothorax. No acute bony abnormality identified. IMPRESSION: No evidence of active cardiopulmonary disease. Electronically Signed   By: Jackey Loge D.O.   On: 03/02/2021 19:10   DG Lumbar Spine Complete  Result Date: 03/02/2021 CLINICAL DATA:  Cough. Additional history provided: Cough, body aches, sore throat, low back pain EXAM: LUMBAR SPINE - COMPLETE 4+ VIEW COMPARISON:  No pertinent prior exams available for comparison. FINDINGS: Five lumbar vertebrae. The caudal most well-formed intervertebral disc space is designated L5-S1. Trace L2-L3 and L3-L4 grade 1 retrolisthesis. No lumbar vertebral compression fracture. The intervertebral disc spaces are maintained. Minimal L5-S1 facet arthrosis No appreciable pars interarticularis defect. Ill-defined calcification above  the right renal contour, in the expected location of the right adrenal gland. This may reflect sequela of prior adrenal hemorrhage or granulomatous disease. IMPRESSION: No lumbar vertebral compression fracture. Trace L2-L3 and L3-L4 grade 1 retrolisthesis. Minimal L5-S1 facet arthrosis. Electronically Signed   By: Jackey Loge D.O.   On: 03/02/2021 19:17    Procedures Procedures   Medications Ordered in ED Medications  sodium chloride 0.9 % bolus 1,000 mL (has no administration in time range)  ketorolac (TORADOL) 30 MG/ML injection 15 mg (has no administration in time range)  acetaminophen (TYLENOL) tablet 1,000 mg (has no administration  in time range)    ED Course  I have reviewed the triage vital signs and the nursing notes.  Pertinent labs & imaging results that were available during my care of the patient were reviewed by me and considered in my medical decision making (see chart for details).   12:08 AM  T 98.8  Following fluid resuscitation the patient has no ongoing complaints.  We discussed findings.  His x-rays, labs.  Urinalysis consistent with dehydration.  X-rays generally reassuring.  On repeat exam I evaluated his back, no deformities, no crepitus, no pain, no erythema.  Patient previously described no IV drug use, and given his denial of current pain, absence of neuro phenomena, low suspicion for atypical epidural infection/discitis.  Patient's x-ray suggest possible adrenal hemorrhage, which may be contributing to his pain.  Given extension improvement here, resolution of fever, tachycardia, improvement, negative COVID, negative influenza, no evidence for strep, patient discharged in stable condition after we discussed return precautions and home instructions. MDM Rules/Calculators/A&P MDM Number of Diagnoses or Management Options Dehydration: new, needed workup Fever in adult: new, needed workup   Amount and/or Complexity of Data Reviewed Clinical lab tests: ordered and  reviewed Tests in the radiology section of CPT: ordered and reviewed Tests in the medicine section of CPT: reviewed and ordered Decide to obtain previous medical records or to obtain history from someone other than the patient: yes Review and summarize past medical records: yes Independent visualization of images, tracings, or specimens: yes  Risk of Complications, Morbidity, and/or Mortality Presenting problems: high Diagnostic procedures: high Management options: high  Critical Care Total time providing critical care: < 30 minutes  Patient Progress Patient progress: improved   Final Clinical Impression(s) / ED Diagnoses Final diagnoses:  Fever in adult  Dehydration     Gerhard Munch, MD 03/03/21 0010

## 2021-03-03 NOTE — Discharge Instructions (Signed)
As discussed, your evaluation today has been largely reassuring.  But, it is important that you monitor your condition carefully, and do not hesitate to return to the ED if you develop new, or concerning changes in your condition. ? ?Otherwise, please follow-up with your physician for appropriate ongoing care. ? ?

## 2021-05-20 ENCOUNTER — Encounter (HOSPITAL_COMMUNITY): Payer: Self-pay | Admitting: Emergency Medicine

## 2021-05-20 ENCOUNTER — Other Ambulatory Visit: Payer: Self-pay

## 2021-05-20 ENCOUNTER — Emergency Department (HOSPITAL_COMMUNITY)
Admission: EM | Admit: 2021-05-20 | Discharge: 2021-05-20 | Disposition: A | Discharge status: Home / Self Care | Payer: Self-pay | Attending: Emergency Medicine | Admitting: Emergency Medicine

## 2021-05-20 DIAGNOSIS — R200 Anesthesia of skin: Secondary | ICD-10-CM | POA: Insufficient documentation

## 2021-05-20 DIAGNOSIS — F17210 Nicotine dependence, cigarettes, uncomplicated: Secondary | ICD-10-CM | POA: Insufficient documentation

## 2021-05-20 DIAGNOSIS — M545 Low back pain, unspecified: Secondary | ICD-10-CM | POA: Insufficient documentation

## 2021-05-20 MED ORDER — KETOROLAC TROMETHAMINE 30 MG/ML IJ SOLN
30.0000 mg | Freq: Once | INTRAMUSCULAR | Status: AC
  Administered 2021-05-20: 30 mg via INTRAMUSCULAR
  Filled 2021-05-20: qty 1

## 2021-05-20 MED ORDER — METHOCARBAMOL 500 MG PO TABS: 500.0000 mg | ORAL_TABLET | Freq: Two times a day (BID) | ORAL | 0 refills | Status: DC

## 2021-05-20 MED ORDER — NAPROXEN 500 MG PO TABS: 500.0000 mg | ORAL_TABLET | Freq: Two times a day (BID) | ORAL | 0 refills | Status: DC

## 2021-05-20 NOTE — ED Provider Notes (Signed)
[place] EMERGENCY DEPARTMENT Provider Note  CSN: [id number] Arrival date & time: 05/20/21 8315    History Chief Complaint  Patient presents with   Numbness   Back Pain    Aaron Fry is a 30 y.o. male with no significant PMH reports he began having back pain yesterday after work, bilateral lower back, no falls or injuries. Not improved with Motrin at home. He noted the pain has moved into both posterior upper legs. He reports some tingling in his lower back and legs, but no numbness, no weakness. Able to walk without difficulty. He denies any bowel or bladder problems, no fevers. No IVDA.    History reviewed. No pertinent past medical history.  Past Surgical History:  Procedure Laterality Date   EXTERNAL FIXATION LEG Left 04/29/2018   Procedure: EXTERNAL FIXATION LEG;  Surgeon: Nadara Mustard, MD;  Location: [facility] OR;  Service: Orthopedics;  Laterality: Left;   FOOT SURGERY     left   ORIF ANKLE FRACTURE Left 04/29/2018   Procedure: OPEN REDUCTION INTERNAL FIXATION (ORIF) LEFT NAVICULAR AND EXTERNAL FIXATION;  Surgeon: Nadara Mustard, MD;  Location: [facility] OR;  Service: Orthopedics;  Laterality: Left;    Family History  Problem Relation Age of Onset   Diabetes Mother     Social History   Tobacco Use   Smoking status: Every Day    Packs/day: 0.50    Types: Cigarettes   Smokeless tobacco: Never  Vaping Use   Vaping Use: Never used  Substance Use Topics   Alcohol use: Not Currently    Comment: rarely   Drug use: Yes    Types: Marijuana    Comment: rarely      Home Medications Prior to Admission medications   Medication Sig Start Date End Date Taking? Authorizing Provider  methocarbamol (ROBAXIN) 500 MG tablet Take 1 tablet (500 mg total) by mouth 2 (two) times daily. 05/20/21  Yes Pollyann Savoy, MD  naproxen (NAPROSYN) 500 MG tablet Take 1 tablet (500 mg total) by mouth 2 (two) times daily with a meal. 05/20/21   Pollyann Savoy, MD  famotidine  (PEPCID) 20 MG tablet Take 1 tablet (20 mg total) by mouth 2 (two) times daily. 05/30/19 03/12/20  Gilda Crease, MD     Allergies    Patient has no known allergies.   Review of Systems   Review of Systems A comprehensive review of systems was completed and negative except as noted in HPI.    Physical Exam BP 117/72 (BP Location: Right Arm)   Pulse 74   Temp 98.5 F (36.9 C) (Oral)   Resp 18   SpO2 98%   Physical Exam Vitals and nursing note reviewed.  Constitutional:      Appearance: Normal appearance.  HENT:     Head: Normocephalic and atraumatic.     Nose: Nose normal.     Mouth/Throat:     Mouth: Mucous membranes are moist.  Eyes:     Extraocular Movements: Extraocular movements intact.     Conjunctiva/sclera: Conjunctivae normal.  Cardiovascular:     Rate and Rhythm: Normal rate.  Pulmonary:     Effort: Pulmonary effort is normal.     Breath sounds: Normal breath sounds.  Abdominal:     General: Abdomen is flat.     Palpations: Abdomen is soft.     Tenderness: There is no abdominal tenderness.  Musculoskeletal:        General: Tenderness (diffuse lower back) present.  No swelling. Normal range of motion.     Cervical back: Neck supple.  Skin:    General: Skin is warm and dry.  Neurological:     General: No focal deficit present.     Mental Status: He is alert.     Cranial Nerves: No cranial nerve deficit.     Sensory: No sensory deficit.     Motor: No weakness.     Gait: Gait normal.     Deep Tendon Reflexes: Reflexes normal.  Psychiatric:        Mood and Affect: Mood normal.     ED Results / Procedures / Treatments   Labs (all labs ordered are listed, but only abnormal results are displayed) Labs Reviewed - No data to display  EKG None  Radiology No results found.  Procedures Procedures  Medications Ordered in the ED Medications  ketorolac (TORADOL) 30 MG/ML injection 30 mg (has no administration in time range)     MDM  Rules/Calculators/A&P MDM Patient with MSK back pain, not radicular. No red flags. Will give Toradol here, Rx for naprosyn and robaxin. He is also requesting a work note. PCP follow up if not improving.   ED Course  I have reviewed the triage vital signs and the nursing notes.  Pertinent labs & imaging results that were available during my care of the patient were reviewed by me and considered in my medical decision making (see chart for details).     Final Clinical Impression(s) / ED Diagnoses Final diagnoses:  Acute bilateral low back pain without sciatica    Rx / DC Orders ED Discharge Orders          Ordered    naproxen (NAPROSYN) 500 MG tablet  2 times daily with meals        05/20/21 1120    methocarbamol (ROBAXIN) 500 MG tablet  2 times daily        05/20/21 1120             Pollyann Savoy, MD 05/20/21 1120

## 2021-05-20 NOTE — ED Triage Notes (Signed)
Reports lower back pain since 4:30pm yesterday with numbness and tingling to all extremities.  No known injury.

## 2021-06-11 ENCOUNTER — Emergency Department (HOSPITAL_COMMUNITY)
Admission: EM | Admit: 2021-06-11 | Discharge: 2021-06-11 | Disposition: A | Discharge status: Home / Self Care | Payer: Self-pay | Attending: Emergency Medicine | Admitting: Emergency Medicine

## 2021-06-11 ENCOUNTER — Encounter (HOSPITAL_COMMUNITY): Payer: Self-pay | Admitting: Emergency Medicine

## 2021-06-11 ENCOUNTER — Emergency Department (HOSPITAL_COMMUNITY): Payer: Self-pay

## 2021-06-11 ENCOUNTER — Other Ambulatory Visit: Payer: Self-pay

## 2021-06-11 DIAGNOSIS — R0789 Other chest pain: Secondary | ICD-10-CM | POA: Insufficient documentation

## 2021-06-11 DIAGNOSIS — R079 Chest pain, unspecified: Secondary | ICD-10-CM

## 2021-06-11 DIAGNOSIS — F17200 Nicotine dependence, unspecified, uncomplicated: Secondary | ICD-10-CM | POA: Insufficient documentation

## 2021-06-11 LAB — BASIC METABOLIC PANEL
Anion gap: 10 (ref 5–15)
BUN: 10 mg/dL (ref 6–20)
CO2: 27 mmol/L (ref 22–32)
Calcium: 9.3 mg/dL (ref 8.9–10.3)
Chloride: 100 mmol/L (ref 98–111)
Creatinine, Ser: 0.88 mg/dL (ref 0.61–1.24)
GFR, Estimated: >60 mL/min (ref >60)
Glucose, Bld: 90 mg/dL (ref 70–99)
Potassium: 4.1 mmol/L (ref 3.5–5.1)
Sodium: 137 mmol/L (ref 135–145)

## 2021-06-11 LAB — CBC
HCT: 45.3 % (ref 39.0–52.0)
Hemoglobin: 15.2 g/dL (ref 13.0–17.0)
MCH: 31.9 pg (ref 26.0–34.0)
MCHC: 33.6 g/dL (ref 30.0–36.0)
MCV: 95.2 fL (ref 80.0–100.0)
Platelets: 258 10*3/uL (ref 150–400)
RBC: 4.76 MIL/uL (ref 4.22–5.81)
RDW: 13 % (ref 11.5–15.5)
WBC: 8.6 10*3/uL (ref 4.0–10.5)
nRBC: 0 % (ref 0.0–0.2)

## 2021-06-11 LAB — TROPONIN I (HIGH SENSITIVITY): Troponin I (High Sensitivity): 3 ng/L (ref <18)

## 2021-06-11 NOTE — ED Notes (Signed)
RN reviewed discharge instructions w/ pt. Follow up reviewed, pt had no further questions 

## 2021-06-11 NOTE — ED Provider Notes (Signed)
[facility]   CSN: [id number] Arrival date & time: 06/11/21  [id number]     History  Chief Complaint  Patient presents with   Chest Pain    Aaron Fry is a 31 y.o. male with  no significant PMHx, who presents to the Emergency Department complaining of intermittent, sternal CP onset 2 weeks. He was working at [employer] when the [employer].  He notes there has been increased demand of work due to the holidays.  He has not tried medications for his symptoms. Denies shortness of breath, fever, chills, cough, abdominal pain, nausea, vomiting.  Patient reports he is a current smoker.  Denies prior MI, CAD, or cardiac cath.   History reviewed. No pertinent past medical history.   The history is provided by the patient. No language interpreter was used.      Home Medications Prior to Admission medications   Medication Sig Start Date End Date Taking? Authorizing Provider  methocarbamol (ROBAXIN) 500 MG tablet Take 1 tablet (500 mg total) by mouth 2 (two) times daily. 05/20/21   Pollyann SavoySheldon, Charles B, MD  naproxen (NAPROSYN) 500 MG tablet Take 1 tablet (500 mg total) by mouth 2 (two) times daily with a meal. 05/20/21   Pollyann SavoySheldon, Charles B, MD  famotidine (PEPCID) 20 MG tablet Take 1 tablet (20 mg total) by mouth 2 (two) times daily. 05/30/19 03/12/20  Gilda CreasePollina, Christopher J, MD      Allergies    Patient has no known allergies.    Review of Systems   Review of Systems  Constitutional:  Negative for chills and fever.  Respiratory:  Negative for shortness of breath.   Cardiovascular:  Positive for chest pain.  Gastrointestinal:  Negative for abdominal pain, nausea and vomiting.  Skin:  Negative for rash.  All other systems reviewed and are negative.  Physical Exam Updated Vital Signs BP (!) 150/132    Pulse 72    Temp 98.2 F (36.8 C) (Oral)    Resp 16    SpO2 97%  Physical Exam Vitals and nursing note reviewed.   Constitutional:      General: He is not in acute distress.    Appearance: He is not diaphoretic.  HENT:     Head: Normocephalic and atraumatic.     Mouth/Throat:     Pharynx: No oropharyngeal exudate.  Eyes:     General: No scleral icterus.    Conjunctiva/sclera: Conjunctivae normal.  Cardiovascular:     Rate and Rhythm: Normal rate and regular rhythm.     Pulses: Normal pulses.     Heart sounds: Normal heart sounds.  Pulmonary:     Effort: Pulmonary effort is normal. No respiratory distress.     Breath sounds: Normal breath sounds. No wheezing.  Chest:     Chest wall: No deformity, tenderness or crepitus.     Comments: No chest wall tenderness to palpation.  No obvious deformities, crepitus, step-offs. Abdominal:     General: Bowel sounds are normal.     Palpations: Abdomen is soft. There is no mass.     Tenderness: There is no abdominal tenderness. There is no guarding or rebound.  Musculoskeletal:        General: Normal range of motion.     Cervical back: Normal range of motion and neck supple.  Skin:    General: Skin is warm and dry.  Neurological:     Mental Status: He is alert.  Psychiatric:  Behavior: Behavior normal.    ED Results / Procedures / Treatments   Labs (all labs ordered are listed, but only abnormal results are displayed) Labs Reviewed  BASIC METABOLIC PANEL  CBC  TROPONIN I (HIGH SENSITIVITY)  TROPONIN I (HIGH SENSITIVITY)    EKG EKG Interpretation  Date/Time:  Monday June 11 2021 09:00:48 EST Ventricular Rate:  85 PR Interval:  142 QRS Duration: 84 QT Interval:  382 QTC Calculation: 454 R Axis:   105 Text Interpretation: Normal sinus rhythm with sinus arrhythmia Rightward axis Borderline ECG When compared with ECG of 07-Jun-2020 13:16, PREVIOUS ECG IS PRESENT Similar to previous Confirmed by Coralee Pesa [phone]) on 06/11/2021 11:48:20 AM  Radiology DG Chest 2 View  Result Date: 06/11/2021 CLINICAL DATA:  Chest pain. EXAM:  CHEST - 2 VIEW COMPARISON:  03/02/2021 FINDINGS: The heart size and mediastinal contours are within normal limits. Both lungs are clear. The visualized skeletal structures are unremarkable. IMPRESSION: No active cardiopulmonary disease. Electronically Signed   By: Signa Kell M.D.   On: 06/11/2021 09:41    Procedures Procedures    Medications Ordered in ED Medications - No data to display  ED Course/ Medical Decision Making/ A&P Clinical Course as of 06/11/21 1206  Mon Jun 11, 2021  1133 Consult with attending who agrees with discharge treatment plan. Pt agreeable to discharge treatment plan. Pt appears safe for discharge.  [SB]    Clinical Course User Index [SB] Trixie Maclaren A, PA-C                           Medical Decision Making  This patient presents to the ED for concern of intermittent sternal chest pain x 2 weeks, this involves an extensive number of treatment options, and is a complaint that carries with it a high risk of complications and morbidity.  The differential diagnosis includes MI, PE, PNA, PTX.    Co morbidities that complicate the patient evaluation  N/a   Lab Tests: I ordered, and personally interpreted labs.  The pertinent results include: CBC, BMP, initial troponin unremarkable.   Imaging Studies ordered: I ordered imaging studies including chest x-ray I independently visualized and interpreted imaging which showed no acute cardiopulmonary findings. I agree with the radiologist interpretation   Problem List / ED Course: Clinical Course as of 06/11/21 1206  Mon Jun 11, 2021  1133 Consult with attending who agrees with discharge treatment plan. Pt agreeable to discharge treatment plan. Pt appears safe for discharge.  [SB]    Clinical Course User Index [SB] Rheagan Nayak A, PA-C    Social Determinants of Health: Patient works at [employer]   Disposition: After consideration of the diagnostic results and the patients response to  treatment, I feel that the patent would benefit from discharge home with conservative management and follow-up with primary care provider or [facility] department.  Supportive care measures and strict return precautions discussed with patient at bedside.  Patient appears safe for discharge.  Follow-up as indicated in discharge paperwork.  This chart was dictated using voice recognition software, Dragon. Despite the best efforts of this provider to proofread and correct errors, errors may still occur which can change documentation meaning.  Final Clinical Impression(s) / ED Diagnoses Final diagnoses:  Chest pain, unspecified type    Rx / DC Orders ED Discharge Orders     None         Yulanda Diggs A, PA-C 06/11/21 1206  Rozelle Logan, DO 06/11/21 1544

## 2021-06-11 NOTE — ED Triage Notes (Signed)
Patient coming from home, complaint of chest pain that comes and goes for approx 2 weeks.

## 2021-06-11 NOTE — ED Provider Notes (Signed)
Emergency Medicine Provider Triage Evaluation Note  Aaron Fry , a 31 y.o. male  was evaluated in triage.  Pt complains of chest pains that come and go.  This has been going on for the past few weeks.  Does not appear to be aggravated by anything.  No shortness of breath or dizziness.  No history of ACS or any other medical condition  Review of Systems  As above  Physical Exam  BP (!) 150/132    Pulse 72    Temp 98.2 F (36.8 C) (Oral)    Resp 16    SpO2 97%  Gen:   Awake, no distress   Resp:  Normal effort  MSK:   Moves extremities without difficulty  Other:  Regular rate and rhythm.  Lung sounds clear  Medical Decision Making  Medically screening exam initiated at 9:17 AM.  Appropriate orders placed.  Aaron Fry was informed that the remainder of the evaluation will be completed by another provider, this initial triage assessment does not replace that evaluation, and the importance of remaining in the ED until their evaluation is complete.     Woodroe Chen 06/11/21 8099    Gloris Manchester, MD 06/13/21 [phone]

## 2021-06-11 NOTE — Discharge Instructions (Addendum)
It was a pleasure taking care of you today!   Your workup in the ED was negative. You may take over the counter 600 mg Ibuprofen every 6 hours or 1,000 mg Tylenol every 6 hours as needed for pain. You may follow up with the [facility] Department as needed. Return to the ED if you are experiencing increasing/worsening chest pain, trouble breathing, or worsening symptoms.

## 2021-06-17 ENCOUNTER — Other Ambulatory Visit: Payer: Self-pay

## 2021-06-17 ENCOUNTER — Encounter (HOSPITAL_COMMUNITY): Payer: Self-pay | Admitting: Emergency Medicine

## 2021-06-17 ENCOUNTER — Emergency Department (HOSPITAL_COMMUNITY)
Admission: EM | Admit: 2021-06-17 | Discharge: 2021-06-17 | Disposition: A | Discharge status: Home / Self Care | Payer: Self-pay | Attending: Student | Admitting: Student

## 2021-06-17 DIAGNOSIS — Z20822 Contact with and (suspected) exposure to covid-19: Secondary | ICD-10-CM | POA: Insufficient documentation

## 2021-06-17 DIAGNOSIS — M545 Low back pain, unspecified: Secondary | ICD-10-CM

## 2021-06-17 DIAGNOSIS — J029 Acute pharyngitis, unspecified: Secondary | ICD-10-CM | POA: Insufficient documentation

## 2021-06-17 DIAGNOSIS — R519 Headache, unspecified: Secondary | ICD-10-CM

## 2021-06-17 LAB — RESP PANEL BY RT-PCR (FLU A&B, COVID) ARPGX2
Influenza A by PCR: NEGATIVE
Influenza B by PCR: NEGATIVE
SARS Coronavirus 2 by RT PCR: NEGATIVE

## 2021-06-17 MED ORDER — METOCLOPRAMIDE HCL 5 MG/ML IJ SOLN
10.0000 mg | Freq: Once | INTRAMUSCULAR | Status: AC
Start: 2021-06-17 — End: 2021-06-17
  Administered 2021-06-17: 10 mg via INTRAVENOUS
  Filled 2021-06-17: qty 2

## 2021-06-17 MED ORDER — KETOROLAC TROMETHAMINE 15 MG/ML IJ SOLN
15.0000 mg | Freq: Once | INTRAMUSCULAR | Status: AC
  Administered 2021-06-17: 15 mg via INTRAVENOUS
  Filled 2021-06-17: qty 1

## 2021-06-17 MED ORDER — DIPHENHYDRAMINE HCL 50 MG/ML IJ SOLN
25.0000 mg | Freq: Once | INTRAMUSCULAR | Status: AC
  Administered 2021-06-17: 25 mg via INTRAVENOUS
  Filled 2021-06-17: qty 1

## 2021-06-17 MED ORDER — LACTATED RINGERS IV BOLUS
1000.0000 mL | Freq: Once | INTRAVENOUS | Status: AC
  Administered 2021-06-17: 1000 mL via INTRAVENOUS

## 2021-06-17 NOTE — Discharge Instructions (Addendum)
Your headache seems to improve with our headache cocktail here in the ED.  Your COVID and flu was negative.  Based on your physical exam, I suspect that your back pain is muscular in nature.  We discussed ways to manage this at home.  I have attached some instructions here for you to help with that.  If you are lying down on your back, put some pillows under your knees to take some pressure off that area.  You can use Tylenol or Motrin as needed for pain.  Use a heat pack to help release that muscle tension.  If you develop any numbness or tingling in your groin, or you urinate or defecate on yourself without realizing, please return to the ED immediately.  Otherwise, your pain should resolve for the next few days to up to a couple weeks.

## 2021-06-17 NOTE — ED Provider Notes (Signed)
[facility]-EMERGENCY DEPT Provider Note   CSN: [id number] Arrival date & time: 06/17/21  0844     History  Chief Complaint  Patient presents with   Back Pain   Headache   Sore Throat    Aaron Fry is a 31 y.o. male who presents to the ED for evaluation of lower back pain and right-sided headache described as sharp with onset about 2 or 3 days ago.  Patient does not think symptoms are related.  He denies trauma, injury, he does not feel that he has slept in abnormal positions.  He does endorse some new congestion, sore throat and cough.  He has been taking ibuprofen for both complaints without any relief.  Patient was seen recently in ED for atypical chest pain. Denies IVDU or drug use.    Back Pain Associated symptoms: headaches   Associated symptoms: no abdominal pain and no fever   Headache Associated symptoms: back pain   Associated symptoms: no abdominal pain, no fever and no vomiting   Sore Throat Associated symptoms include headaches. Pertinent negatives include no abdominal pain and no shortness of breath.      Home Medications Prior to Admission medications   Medication Sig Start Date End Date Taking? Authorizing Provider  methocarbamol (ROBAXIN) 500 MG tablet Take 1 tablet (500 mg total) by mouth 2 (two) times daily. 05/20/21   Pollyann Savoy, MD  naproxen (NAPROSYN) 500 MG tablet Take 1 tablet (500 mg total) by mouth 2 (two) times daily with a meal. 05/20/21   Pollyann Savoy, MD  famotidine (PEPCID) 20 MG tablet Take 1 tablet (20 mg total) by mouth 2 (two) times daily. 05/30/19 03/12/20  Gilda Crease, MD      Allergies    Patient has no known allergies.    Review of Systems   Review of Systems  Constitutional:  Negative for fever.  HENT: Negative.    Eyes: Negative.   Respiratory:  Negative for shortness of breath.   Cardiovascular: Negative.   Gastrointestinal:  Negative for abdominal pain and vomiting.  Endocrine:  Negative.   Genitourinary: Negative.   Musculoskeletal:  Positive for back pain.  Skin:  Negative for rash.  Neurological:  Positive for headaches.  All other systems reviewed and are negative.  Physical Exam Updated Vital Signs BP 113/69    Pulse 73    Temp 98.1 F (36.7 C) (Oral)    Resp 17    Ht 5\' 9"  (1.753 m)    Wt 65.8 kg    SpO2 100%    BMI 21.41 kg/m  Physical Exam Vitals and nursing note reviewed.  Constitutional:      General: He is not in acute distress.    Appearance: He is not ill-appearing.  HENT:     Head: Atraumatic.  Eyes:     Extraocular Movements:     Right eye: Normal extraocular motion.     Left eye: Normal extraocular motion.     Conjunctiva/sclera: Conjunctivae normal.  Cardiovascular:     Rate and Rhythm: Normal rate and regular rhythm.     Pulses: Normal pulses.     Heart sounds: No murmur heard. Pulmonary:     Effort: Pulmonary effort is normal. No respiratory distress.     Breath sounds: Normal breath sounds.  Abdominal:     General: Abdomen is flat. There is no distension.     Palpations: Abdomen is soft.     Tenderness: There is no abdominal  tenderness.  Musculoskeletal:        General: Normal range of motion.     Cervical back: Normal range of motion.     Comments: There is no tenderness palpation of the T-spine or L-spine spinous processes.  He does have some mild paraspinal tenderness of the L-spine.  Full range of motion of L and T-spine. Sensation of the bilateral lower extremities is intact.  2+ DP pulses bilaterally.  Skin:    General: Skin is warm and dry.     Capillary Refill: Capillary refill takes less than 2 seconds.  Neurological:     General: No focal deficit present.     Mental Status: He is alert.     Comments: Speech is clear, able to follow commands CN III-XII intact Normal strength in upper and lower extremities bilaterally including dorsiflexion and plantar flexion, strong and equal grip strength Sensation normal to  light and sharp touch Moves extremities without ataxia, coordination intact Normal finger to nose and rapid alternating movements No pronator drift    Psychiatric:        Mood and Affect: Mood normal.    ED Results / Procedures / Treatments   Labs (all labs ordered are listed, but only abnormal results are displayed) Labs Reviewed  RESP PANEL BY RT-PCR (FLU A&B, COVID) ARPGX2    EKG None  Radiology No results found.  Procedures Procedures    Medications Ordered in ED Medications  metoCLOPramide (REGLAN) injection 10 mg (has no administration in time range)  diphenhydrAMINE (BENADRYL) injection 25 mg (has no administration in time range)  ketorolac (TORADOL) 15 MG/ML injection 15 mg (has no administration in time range)  lactated ringers bolus 1,000 mL (has no administration in time range)    ED Course/ Medical Decision Making/ A&P Clinical Course as of 06/17/21 1123  Sun Jun 17, 2021  1038 Resp Panel by RT-PCR (Flu A&B, Covid) Nasopharyngeal Swab Respiratory panel negative [EC]    Clinical Course User Index [EC] Janell Quietonklin, Davonne Baby R, PA-C                           Medical Decision Making This patient presents to the ED for concern of headache and low back pain, this involves an extensive number of treatment options, and is a complaint that carries with it a high risk of complications and morbidity. Emergent considerations for headache include subarachnoid hemorrhage, meningitis, temporal arteritis, glaucoma, cerebral ischemia, carotid/vertebral dissection, intracranial tumor, Venous sinus thrombosis, carbon monoxide poisoning, acute or chronic subdural hemorrhage.  Other considerations include: Migraine, Cluster headache, Hypertension, Caffeine, alcohol, or drug withdrawal, Pseudotumor cerebri, Arteriovenous malformation, Head injury, Neurocysticercosis, Post-lumbar puncture, Preeclampsia, Tension headache, Sinusitis, Cervical arthritis, Refractive error causing strain,  Dental abscess, Otitis media, Temporomandibular joint syndrome, Depression, Somatoform disorder (eg, somatization) Trigeminal neuralgia, Glossopharyngeal neuralgia. Emergent considerations in the differential diagnosis of back pain include:occult fracture, congenital anomalies, tumors, vascular catastrophes, osteomyelitis of vertebrae, infections of disc, meninges or cord, space occupying lesions within canal leading to cord or root, compression including epidural abscess.    Additional history obtained:  External records from outside source obtained and reviewed including previous ED discharge summaries   Lab Tests:  I Ordered, reviewed, and interpreted labs.  The pertinent results include: Negative respiratory panel   Imaging Studies ordered:  Given patient's physical exam, history no imaging is indicated at this time.   Medicines ordered and prescription drug management:  I ordered medication including Toradol 15 IV,  Reglan 10 IV, diphenhydramine 25 IV for headache along with 1 L lactated ringer fluid bolus Reevaluation of the patient after these medicines showed that the patient resolved I have reviewed the patients home medicines and have made adjustments as needed   Dispostion:  After consideration of the diagnostic results and the patients response to treatment feel that the patent would benefit from discharge with outpatient management 1. Headache, unilateral without aura-symptoms completely resolved with migraine cocktail.  Patient's flu and COVID-negative.  Physical exam otherwise benign.  He is safe to discharge home.  He can use Tylenol Motrin if symptoms return.  Advised on signs symptoms to return to the ED.  Patient agrees and amenable to plan. 2.  Acute low back pain without sciatica-pain is muscular in nature.  We discussed at home supportive measures to help with his pain and promote healing.  All questions asked and answered.  We discussed red flag symptoms that  warrant return to the ED.  Patient understands and is amenable to plan.  Discharged home in good condition.  Final Clinical Impression(s) / ED Diagnoses Final diagnoses:  Acute intractable headache, unspecified headache type  Acute bilateral low back pain without sciatica    Rx / DC Orders ED Discharge Orders     None         Janell Quiet, PA-C 06/17/21 1127    Kommor, Wyn Forster, MD 06/17/21 1600

## 2021-06-17 NOTE — ED Triage Notes (Signed)
Patient arrives ambulatory c/o lower back pain onset of Thursday night. No relief from ibuprofen. Patient also c/o pain to right side of head onset of yesterday. Patient also having sore throat with painful to swallow starting yesterday morning. Reports eating and drinking normal.

## 2021-06-21 ENCOUNTER — Encounter (HOSPITAL_COMMUNITY): Payer: Self-pay

## 2021-06-21 ENCOUNTER — Emergency Department (HOSPITAL_COMMUNITY)
Admission: EM | Admit: 2021-06-21 | Discharge: 2021-06-21 | Disposition: A | Discharge status: Home / Self Care | Payer: Self-pay | Attending: Emergency Medicine | Admitting: Emergency Medicine

## 2021-06-21 DIAGNOSIS — F17210 Nicotine dependence, cigarettes, uncomplicated: Secondary | ICD-10-CM | POA: Insufficient documentation

## 2021-06-21 DIAGNOSIS — R55 Syncope and collapse: Secondary | ICD-10-CM | POA: Insufficient documentation

## 2021-06-21 LAB — BASIC METABOLIC PANEL
Anion gap: 9 (ref 5–15)
BUN: 12 mg/dL (ref 6–20)
CO2: 27 mmol/L (ref 22–32)
Calcium: 8.8 mg/dL — ABNORMAL LOW (ref 8.9–10.3)
Chloride: 99 mmol/L (ref 98–111)
Creatinine, Ser: 0.77 mg/dL (ref 0.61–1.24)
GFR, Estimated: >60 mL/min (ref >60)
Glucose, Bld: 106 mg/dL — ABNORMAL HIGH (ref 70–99)
Potassium: 3.6 mmol/L (ref 3.5–5.1)
Sodium: 135 mmol/L (ref 135–145)

## 2021-06-21 LAB — CBC
HCT: 42.5 % (ref 39.0–52.0)
Hemoglobin: 14.2 g/dL (ref 13.0–17.0)
MCH: 31.2 pg (ref 26.0–34.0)
MCHC: 33.4 g/dL (ref 30.0–36.0)
MCV: 93.4 fL (ref 80.0–100.0)
Platelets: 257 10*3/uL (ref 150–400)
RBC: 4.55 MIL/uL (ref 4.22–5.81)
RDW: 13 % (ref 11.5–15.5)
WBC: 9.2 10*3/uL (ref 4.0–10.5)
nRBC: 0 % (ref 0.0–0.2)

## 2021-06-21 LAB — URINALYSIS, ROUTINE W REFLEX MICROSCOPIC
Bilirubin Urine: NEGATIVE
Glucose, UA: NEGATIVE mg/dL
Hgb urine dipstick: NEGATIVE
Ketones, ur: NEGATIVE mg/dL
Leukocytes,Ua: NEGATIVE
Nitrite: NEGATIVE
Protein, ur: NEGATIVE mg/dL
Specific Gravity, Urine: 1.011 (ref 1.005–1.030)
pH: 7 (ref 5.0–8.0)

## 2021-06-21 LAB — CBG MONITORING, ED: Glucose-Capillary: 164 mg/dL — ABNORMAL HIGH (ref 70–99)

## 2021-06-21 MED ORDER — SODIUM CHLORIDE 0.9 % IV BOLUS
1000.0000 mL | Freq: Once | INTRAVENOUS | Status: AC
  Administered 2021-06-21: 1000 mL via INTRAVENOUS

## 2021-06-21 NOTE — ED Triage Notes (Signed)
Pt presents with c/o three syncopal episodes that occurred yesterday. Pt reports that he did have a positive LOC 3 times, unknown of the cause.

## 2021-06-21 NOTE — Discharge Instructions (Signed)
You were seen in the emergency department for complaint of 3 fainting spells.  You had lab work and an EKG that did not show an obvious explanation for your symptoms.  You should cut back on your soda intake and drink more fluids like water or Gatorade.  Return to the emergency department if any worsening or concerning symptoms

## 2021-06-21 NOTE — ED Provider Notes (Signed)
[facility]-EMERGENCY DEPT Provider Note   CSN: [id number] Arrival date & time: 06/21/21  0818     History  Chief Complaint  Patient presents with   Loss of Consciousness    Aaron Fry is a 31 y.o. male.  He has no significant past medical history.  He is complaining of 3 episodes of syncope that occurred yesterday.  He said he felt lightheaded prior to further occurrence.  He denies any injury from them.  No chest pain or shortness of breath.  He feels he has been eating and drinking well.  He is not on any medications regularly.  He does smoke cigarettes denies any alcohol or street drugs.  No fevers chills nausea vomiting.  No prior history of syncope.  The history is provided by the patient.  Loss of Consciousness Episode history:  Multiple Most recent episode:  Yesterday Progression:  Resolved Chronicity:  New Context: normal activity   Witnessed: no   Relieved by:  None tried Worsened by:  Nothing Ineffective treatments:  None tried Associated symptoms: dizziness   Associated symptoms: no chest pain, no difficulty breathing, no fever, no nausea, no shortness of breath and no vomiting       Home Medications Prior to Admission medications   Medication Sig Start Date End Date Taking? Authorizing Provider  methocarbamol (ROBAXIN) 500 MG tablet Take 1 tablet (500 mg total) by mouth 2 (two) times daily. Patient not taking: Reported on 06/17/2021 05/20/21   Pollyann SavoySheldon, Charles B, MD  naproxen (NAPROSYN) 500 MG tablet Take 1 tablet (500 mg total) by mouth 2 (two) times daily with a meal. Patient not taking: Reported on 06/17/2021 05/20/21   Pollyann SavoySheldon, Charles B, MD  famotidine (PEPCID) 20 MG tablet Take 1 tablet (20 mg total) by mouth 2 (two) times daily. 05/30/19 03/12/20  Gilda CreasePollina, Christopher J, MD      Allergies    Patient has no known allergies.    Review of Systems   Review of Systems  Constitutional:  Negative for fever.  HENT:  Negative for sore  throat.   Eyes:  Negative for visual disturbance.  Respiratory:  Negative for shortness of breath.   Cardiovascular:  Positive for syncope. Negative for chest pain.  Gastrointestinal:  Negative for abdominal pain, nausea and vomiting.  Genitourinary:  Negative for dysuria.  Musculoskeletal:  Negative for neck pain.  Skin:  Negative for rash.  Neurological:  Positive for dizziness.   Physical Exam Updated Vital Signs BP 121/67 (BP Location: Left Arm)    Pulse 83    Temp 98 F (36.7 C) (Oral)    Resp 18    SpO2 97%  Physical Exam Vitals and nursing note reviewed.  Constitutional:      General: He is not in acute distress.    Appearance: Normal appearance. He is well-developed.  HENT:     Head: Normocephalic and atraumatic.  Eyes:     Conjunctiva/sclera: Conjunctivae normal.  Cardiovascular:     Rate and Rhythm: Normal rate and regular rhythm.     Heart sounds: No murmur heard. Pulmonary:     Effort: Pulmonary effort is normal. No respiratory distress.     Breath sounds: Normal breath sounds.  Abdominal:     Palpations: Abdomen is soft.     Tenderness: There is no abdominal tenderness. There is no guarding or rebound.  Musculoskeletal:        General: No swelling. Normal range of motion.     Cervical back:  Neck supple.  Skin:    General: Skin is warm and dry.     Capillary Refill: Capillary refill takes less than 2 seconds.  Neurological:     General: No focal deficit present.     Mental Status: He is alert.     Sensory: No sensory deficit.     Motor: No weakness.     Gait: Gait normal.  Psychiatric:        Mood and Affect: Mood normal.    ED Results / Procedures / Treatments   Labs (all labs ordered are listed, but only abnormal results are displayed) Labs Reviewed  BASIC METABOLIC PANEL - Abnormal; Notable for the following components:      Result Value   Glucose, Bld 106 (*)    Calcium 8.8 (*)    All other components within normal limits  CBG MONITORING, ED -  Abnormal; Notable for the following components:   Glucose-Capillary 164 (*)    All other components within normal limits  CBC  URINALYSIS, ROUTINE W REFLEX MICROSCOPIC    EKG EKG Interpretation  Date/Time:  Thursday June 21 2021 08:34:30 EST Ventricular Rate:  86 PR Interval:  141 QRS Duration: 88 QT Interval:  381 QTC Calculation: 456 R Axis:   115 Text Interpretation: Sinus rhythm Right axis deviation Borderline repolarization abnormality No significant change since prior 1/23 Confirmed by Meridee Score [phone]) on 06/21/2021 8:46:28 AM  Radiology No results found.  Procedures Procedures    Medications Ordered in ED Medications  sodium chloride 0.9 % bolus 1,000 mL (0 mLs Intravenous Stopped 06/21/21 1113)    ED Course/ Medical Decision Making/ A&P Clinical Course as of 06/22/21 0917  Thu Jun 21, 2021  1002 Orthostatics are equivocal with a 13 point drop in systolic blood pressure but no corresponding tachycardia.  Ordered liter of IV fluids. [MB]  1054 Patient's lab work-up has been fairly unremarkable.  On review of his triage note he had signed in saying he had a 10 out of 10 headache but denies any headache now.  He thinks it may be related to drinking too much soda at work.  Recommended drinking more water or electrolyte containing solutions.  Return instructions discussed [MB]    Clinical Course User Index [MB] Terrilee Files, MD                           Medical Decision Making  This patient complains of syncope x3 yesterday; this involves an extensive number of treatment Options and is a complaint that carries with it a high risk of complications and Morbidity. The differential includes vagal syncope, hypovolemia, arrhythmia, metabolic derangement, anemia  I ordered, reviewed and interpreted labs, which included CBC with normal white count normal hemoglobin, chemistries normal, urinalysis normal I ordered medication IV fluids Previous records obtained  and reviewed in epic, patient was recently in the department for visits for headache and chest pain After the interventions stated above, I reevaluated the patient and found patient to be asymptomatic with stable vitals.  Reviewed work-up with him and he is comfortable plan for discharge.  Recommended continued hydration.  Return instructions discussed.  No indication for admission at this time.         Final Clinical Impression(s) / ED Diagnoses Final diagnoses:  Syncope and collapse    Rx / DC Orders ED Discharge Orders     None         Terrilee Files,  MD 06/22/21 [id number]

## 2021-07-20 ENCOUNTER — Other Ambulatory Visit: Payer: Self-pay

## 2021-07-20 ENCOUNTER — Ambulatory Visit
Admission: RE | Admit: 2021-07-20 | Discharge: 2021-07-20 | Disposition: A | Discharge status: Home / Self Care | Payer: Managed Care, Other (non HMO) | Source: Ambulatory Visit

## 2021-07-20 VITALS — BP 111/69 | HR 83 | Temp 98.5°F | Resp 18

## 2021-07-20 DIAGNOSIS — G8929 Other chronic pain: Secondary | ICD-10-CM

## 2021-07-20 DIAGNOSIS — M79672 Pain in left foot: Secondary | ICD-10-CM

## 2021-07-20 NOTE — ED Triage Notes (Signed)
Pt reports having pain to left foot, he reports having an injury to the extremity 3 years ago.

## 2021-07-20 NOTE — ED Provider Notes (Signed)
[facility]    CSN: [id number] Arrival date & time: 07/20/21  1244    HISTORY   Chief Complaint  Patient presents with   Foot Pain   HPI Aaron Fry is a 31 y.o. male. Patient complains of pain in his left foot, states that he injured it 3 years ago.  His place of employment, he has to work in a freezer area, states he has been there for 3 months and states today that he cannot take it anymore.  Patient is asking for accommodations from his employer to work in different department, patient states he was advised that he will need a medical excuse.  The history is provided by the patient.  History reviewed. No pertinent past medical history. There are no problems to display for this patient.  History reviewed. No pertinent surgical history.  Home Medications    Prior to Admission medications   Not on File    Family History History reviewed. No pertinent family history. Social History Social History   Tobacco Use   Smoking status: Some Days    Types: Cigarettes   Smokeless tobacco: Never  Vaping Use   Vaping Use: Never used  Substance Use Topics   Alcohol use: Never   Drug use: Never   Allergies   Patient has no allergy information on record.  Review of Systems Review of Systems Pertinent findings noted in history of present illness.   Physical Exam Triage Vital Signs ED Triage Vitals  Enc Vitals Group     BP 04/06/21 0827 (!) 147/82     Pulse Rate 04/06/21 0827 72     Resp 04/06/21 0827 18     Temp 04/06/21 0827 98.3 F (36.8 C)     Temp Source 04/06/21 0827 Oral     SpO2 04/06/21 0827 98 %     Weight --      Height --      Head Circumference --      Peak Flow --      Pain Score 04/06/21 0826 5     Pain Loc --      Pain Edu? --      Excl. in GC? --   No data found.  Updated Vital Signs BP 111/69 (BP Location: Right Arm)    Pulse 83    Temp 98.5 F (36.9 C) (Oral)    Resp 18    SpO2 96%   Physical Exam Vitals and nursing  note reviewed.  Constitutional:      General: He is not in acute distress.    Appearance: Normal appearance. He is not ill-appearing.  HENT:     Head: Normocephalic and atraumatic.  Eyes:     General: Lids are normal.        Right eye: No discharge.        Left eye: No discharge.     Extraocular Movements: Extraocular movements intact.     Conjunctiva/sclera: Conjunctivae normal.     Right eye: Right conjunctiva is not injected.     Left eye: Left conjunctiva is not injected.  Neck:     Trachea: Trachea and phonation normal.  Cardiovascular:     Rate and Rhythm: Normal rate and regular rhythm.     Pulses: Normal pulses.     Heart sounds: Normal heart sounds. No murmur heard.   No friction rub. No gallop.  Pulmonary:     Effort: Pulmonary effort is normal. No accessory muscle usage, prolonged expiration or  respiratory distress.     Breath sounds: Normal breath sounds. No stridor, decreased air movement or transmitted upper airway sounds. No decreased breath sounds, wheezing, rhonchi or rales.  Chest:     Chest wall: No tenderness.  Musculoskeletal:        General: No swelling, tenderness, deformity or signs of injury. Normal range of motion.     Cervical back: Normal range of motion and neck supple. Normal range of motion.  Lymphadenopathy:     Cervical: No cervical adenopathy.  Skin:    General: Skin is warm and dry.     Findings: No erythema or rash.  Neurological:     General: No focal deficit present.     Mental Status: He is alert and oriented to person, place, and time.  Psychiatric:        Mood and Affect: Mood normal.        Behavior: Behavior normal.    Visual Acuity Right Eye Distance:   Left Eye Distance:   Bilateral Distance:    Right Eye Near:   Left Eye Near:    Bilateral Near:     UC Couse / Diagnostics / Procedures:    EKG  Radiology No results found.  Procedures Procedures (including critical care time)  UC Diagnoses / Final Clinical  Impressions(s)   I have reviewed the triage vital signs and the nursing notes.  Pertinent labs & imaging results that were available during my care of the patient were reviewed by me and considered in my medical decision making (see chart for details).    Final diagnoses:  Chronic foot pain, left   Patient denies acute injury to his left foot.  Patient politely declines evaluation of his left foot given new onset of pain and cold environment.  Patient specifically requesting a note for work accommodations, patient advised that I can provide him with a note to be out of work for 3 days but that he will need to see a primary care provider for medical accommodations.  All questions addressed.  ED Prescriptions   None    PDMP not reviewed this encounter.  Pending results:  Labs Reviewed - No data to display  Medications Ordered in UC: Medications - No data to display  Disposition Upon Discharge:  Condition: stable for discharge home Home: take medications as prescribed; routine discharge instructions as discussed; follow up as advised.  Patient presented with an acute illness with associated systemic symptoms and significant discomfort requiring urgent management. In my opinion, this is a condition that a prudent lay person (someone who possesses an average knowledge of health and medicine) may potentially expect to result in complications if not addressed urgently such as respiratory distress, impairment of bodily function or dysfunction of bodily organs.   Routine symptom specific, illness specific and/or disease specific instructions were discussed with the patient and/or caregiver at length.   As such, the patient has been evaluated and assessed, work-up was performed and treatment was provided in alignment with urgent care protocols and evidence based medicine.  Patient/parent/caregiver has been advised that the patient may require follow up for further testing and treatment if the  symptoms continue in spite of treatment, as clinically indicated and appropriate.  If the patient was tested for COVID-19, Influenza and/or RSV, then the patient/parent/guardian was advised to isolate at home pending the results of his/her diagnostic coronavirus test and potentially longer if theyre positive. I have also advised pt that if his/her COVID-19 test returns positive, it's  recommended to self-isolate for at least 10 days after symptoms first appeared AND until fever-free for 24 hours without fever reducer AND other symptoms have improved or resolved. Discussed self-isolation recommendations as well as instructions for household member/close contacts as per the [facility] and [place] [facility], and also gave patient the COVID packet with this information.  Patient/parent/caregiver has been advised to return to the [facility] or PCP in 3-5 days if no better; to PCP or the Emergency Department if new signs and symptoms develop, or if the current signs or symptoms continue to change or worsen for further workup, evaluation and treatment as clinically indicated and appropriate  The patient will follow up with their current PCP if and as advised. If the patient does not currently have a PCP we will assist them in obtaining one.   The patient may need specialty follow up if the symptoms continue, in spite of conservative treatment and management, for further workup, evaluation, consultation and treatment as clinically indicated and appropriate.   Patient/parent/caregiver verbalized understanding and agreement of plan as discussed.  All questions were addressed during visit.  Please see discharge instructions below for further details of plan.  Discharge Instructions:   Discharge Instructions      I have initiated request on your behalf to help you find a primary care provider who can assist you with obtaining accommodations from your employer regarding her work environment.  I provided you with a note to be out of  work for 3 days, in the urgent care setting, that is all I am allowed to do.    This office note has been dictated using Teaching laboratory technician.  Unfortunately, and despite my best efforts, this method of dictation can sometimes lead to occasional typographical or grammatical errors.  I apologize in advance if this occurs.     Theadora Rama Scales, PA-C 07/20/21 1341

## 2021-07-20 NOTE — Discharge Instructions (Addendum)
I have initiated request on your behalf to help you find a primary care provider who can assist you with obtaining accommodations from your employer regarding her work environment.  I provided you with a note to be out of work for 3 days, in the urgent care setting, that is all I am allowed to do.

## 2021-08-30 ENCOUNTER — Emergency Department (HOSPITAL_COMMUNITY)
Admission: EM | Admit: 2021-08-30 | Discharge: 2021-08-30 | Disposition: A | Discharge status: Home / Self Care | Payer: Managed Care, Other (non HMO) | Attending: Emergency Medicine | Admitting: Emergency Medicine

## 2021-08-30 ENCOUNTER — Encounter (HOSPITAL_COMMUNITY): Payer: Self-pay

## 2021-08-30 ENCOUNTER — Other Ambulatory Visit: Payer: Self-pay

## 2021-08-30 DIAGNOSIS — M79605 Pain in left leg: Secondary | ICD-10-CM | POA: Insufficient documentation

## 2021-08-30 MED ORDER — METHOCARBAMOL 500 MG PO TABS: 500.0000 mg | ORAL_TABLET | Freq: Three times a day (TID) | ORAL | 0 refills | Status: DC | PRN

## 2021-08-30 MED ORDER — NAPROXEN 500 MG PO TABS: 500.0000 mg | ORAL_TABLET | Freq: Two times a day (BID) | ORAL | 0 refills | Status: DC | PRN

## 2021-08-30 NOTE — ED Provider Notes (Signed)
?[facility]-EMERGENCY DEPT ?Provider Note ? ? ?CSN: [id number] ?Arrival date & time: 08/30/21  0086 ? ?  ? ?History ? ?Chief Complaint  ?Patient presents with  ? Leg Pain  ? ? ?Aaron Fry is a 31 y.o. male. ? ?The history is provided by the patient and medical records. No language interpreter was used.  ?Leg Pain ?Associated symptoms: no fever   ? ?31 year old male presenting complaining of leg pain.  Patient report for the past 2 to 3 days he has been experiencing intermittent pain to his left leg.  Pain is described as a sharp shooting pain from his left lateral thigh down his leg.  It happens sporadically and worsen when he bear weight.  He reports minimal pain at rest.  He does not complain of any fever chills no back pain no bowel bladder incontinence or saddle anesthesia.  He denies any recent injury.  He denies any rash.  He did try taking some Tylenol at home without adequate relief.  No history of IV drug use. ? ?Home Medications ?Prior to Admission medications   ?Medication Sig Start Date End Date Taking? Authorizing Provider  ?methocarbamol (ROBAXIN) 500 MG tablet Take 1 tablet (500 mg total) by mouth 2 (two) times daily. ?Patient not taking: Reported on 06/17/2021 05/20/21   Pollyann Savoy, MD  ?naproxen (NAPROSYN) 500 MG tablet Take 1 tablet (500 mg total) by mouth 2 (two) times daily with a meal. ?Patient not taking: Reported on 06/17/2021 05/20/21   Pollyann Savoy, MD  ?famotidine (PEPCID) 20 MG tablet Take 1 tablet (20 mg total) by mouth 2 (two) times daily. 05/30/19 03/12/20  Gilda Crease, MD  ?   ? ?Allergies    ?Patient has no known allergies.   ? ?Review of Systems   ?Review of Systems  ?Constitutional:  Negative for fever.  ?Skin:  Negative for rash.  ? ?Physical Exam ?Updated Vital Signs ?BP 115/72 (BP Location: Right Arm)   Pulse 66   Temp 98.4 ?F (36.9 ?C) (Oral)   Resp 16   Ht 5\' 9"  (1.753 m)   Wt 63.5 kg   SpO2 98%   BMI 20.67 kg/m?  ?Physical  Exam ?Vitals and nursing note reviewed.  ?Constitutional:   ?   General: He is not in acute distress. ?   Appearance: He is well-developed.  ?HENT:  ?   Head: Atraumatic.  ?Eyes:  ?   Conjunctiva/sclera: Conjunctivae normal.  ?Abdominal:  ?   Palpations: Abdomen is soft.  ?   Tenderness: There is no abdominal tenderness.  ?Musculoskeletal:     ?   General: Tenderness (Left lower extremity.  Mild tenderness along the lateral aspects of the left thigh without any overlying skin changes.  Leg compartment is soft, no deformity, left calf nontender, no edema noted.) present.  ?   Cervical back: Neck supple.  ?   Comments: No significant midline spine tenderness.  Negative straight leg raise  ?Skin: ?   Findings: No rash.  ?Neurological:  ?   Mental Status: He is alert. Mental status is at baseline.  ? ? ?ED Results / Procedures / Treatments   ?Labs ?(all labs ordered are listed, but only abnormal results are displayed) ?Labs Reviewed - No data to display ? ?EKG ?None ? ?Radiology ?No results found. ? ?Procedures ?Procedures  ? ? ?Medications Ordered in ED ?Medications - No data to display ? ?ED Course/ Medical Decision Making/ A&P ?  ?                        ?  Medical Decision Making ? ?BP 115/72 (BP Location: Right Arm)   Pulse 66   Temp 98.4 ?F (36.9 ?C) (Oral)   Resp 16   Ht 5\' 9"  (1.753 m)   Wt 63.5 kg   SpO2 98%   BMI 20.67 kg/m?  ? ?7:15 AM ?This is a 31 year old male presenting complaining of left leg pain in which he describes a sharp shooting sensation that happen sporadically for the past few days.  Pain worsened with weight bearing and minimal relief presents at rest.  Pain is likely more MSK possible nerve pain.  No signs of infection noted on exam.  Leg compartments soft, doubt compartment syndrome.  No cellulitis or abscess, no septic joint.  No significant back pain or bowel bladder incontinence to suggest cauda equina.  No red flags.  He is able to ambulate.  Will provide symptomatic treatment and  outpatient follow-up.  Patient also requesting for work note. ? ? ? ? ? ? ? ?Final Clinical Impression(s) / ED Diagnoses ?Final diagnoses:  ?Left leg pain  ? ? ?Rx / DC Orders ?ED Discharge Orders   ? ?      Ordered  ?  methocarbamol (ROBAXIN) 500 MG tablet  Every 8 hours PRN       ? 08/30/21 0718  ?  naproxen (NAPROSYN) 500 MG tablet  2 times daily PRN       ? 08/30/21 09/01/21  ? ?  ?  ? ?  ? ? ?  ?[id number], PA-C ?08/30/21 0720 ? ?  ?09/01/21, MD ?09/03/21 [phone] ? ?

## 2021-08-30 NOTE — ED Triage Notes (Signed)
Patient presents to ED from home with c/o left leg pain x 2-3 days. Denies swelling or injury to area. Endorses sharp shooting pains.  ?

## 2021-09-21 ENCOUNTER — Encounter (HOSPITAL_COMMUNITY): Payer: Self-pay

## 2021-09-29 ENCOUNTER — Encounter (HOSPITAL_COMMUNITY): Payer: Self-pay

## 2021-09-29 ENCOUNTER — Emergency Department (HOSPITAL_COMMUNITY)
Admission: EM | Admit: 2021-09-29 | Discharge: 2021-09-29 | Disposition: A | Discharge status: Home / Self Care | Payer: Managed Care, Other (non HMO) | Attending: Emergency Medicine | Admitting: Emergency Medicine

## 2021-09-29 ENCOUNTER — Other Ambulatory Visit: Payer: Self-pay

## 2021-09-29 DIAGNOSIS — F419 Anxiety disorder, unspecified: Secondary | ICD-10-CM | POA: Diagnosis present

## 2021-09-29 DIAGNOSIS — F410 Panic disorder [episodic paroxysmal anxiety] without agoraphobia: Secondary | ICD-10-CM

## 2021-09-29 MED ORDER — HYDROXYZINE HCL 25 MG PO TABS: 25.0000 mg | ORAL_TABLET | Freq: Three times a day (TID) | ORAL | 0 refills | Status: DC | PRN

## 2021-09-29 NOTE — ED Triage Notes (Addendum)
Pt had onset of chest tightness, SOB, facial/arm numbness r/t panic attack yesterday at work. Most symptoms resolved after resting/lying down. Currently C/o nervousness, chest tightness.  ?

## 2021-09-29 NOTE — ED Provider Notes (Signed)
?[facility]-EMERGENCY DEPT ?Provider Note ? ? ?CSN: [id number] ?Arrival date & time: 09/29/21  1235 ? ?  ? ?History ? ?Chief Complaint  ?Patient presents with  ? Anxiety  ? ? ?Aaron Fry is a 31 y.o. male. ? ? ?Anxiety ?Patient presents after stated anxiety attack.  States last night he began having ?Tach.  States he felt his heart racing.  States his mouth started to open his hands contracted.  States he does feel better not all the way back to normal.  States still feels a little off.  States nothing triggered it.  Has not really had episodes like this before.  Has been doing well otherwise. ? ?  ? ?Home Medications ?Prior to Admission medications   ?Medication Sig Start Date End Date Taking? Authorizing Provider  ?hydrOXYzine (ATARAX) 25 MG tablet Take 1 tablet (25 mg total) by mouth every 8 (eight) hours as needed for anxiety. 09/29/21  Yes Benjiman Core, MD  ?methocarbamol (ROBAXIN) 500 MG tablet Take 1 tablet (500 mg total) by mouth every 8 (eight) hours as needed for muscle spasms. 08/30/21   Fayrene Helper, PA-C  ?naproxen (NAPROSYN) 500 MG tablet Take 1 tablet (500 mg total) by mouth 2 (two) times daily as needed for moderate pain. 08/30/21   Fayrene Helper, PA-C  ?famotidine (PEPCID) 20 MG tablet Take 1 tablet (20 mg total) by mouth 2 (two) times daily. 05/30/19 03/12/20  Gilda Crease, MD  ?   ? ?Allergies    ?Patient has no known allergies.   ? ?Review of Systems   ?Review of Systems  ?Psychiatric/Behavioral:  The patient is nervous/anxious. The patient is not hyperactive.   ? ?Physical Exam ?Updated Vital Signs ?BP 113/67 (BP Location: Right Arm)   Pulse 90   Temp 98.6 ?F (37 ?C) (Oral)   Resp 16   Ht 5\' 9"  (1.753 m)   Wt 64.9 kg   BMI 21.12 kg/m?  ?Physical Exam ?Vitals and nursing note reviewed.  ?HENT:  ?   Head: Atraumatic.  ?Pulmonary:  ?   Breath sounds: No wheezing or rhonchi.  ?Abdominal:  ?   Tenderness: There is no abdominal tenderness.  ?Skin: ?   Comments:  Mild facial rash.  ?Neurological:  ?   Mental Status: He is alert and oriented to person, place, and time.  ?Psychiatric:     ?   Mood and Affect: Mood normal.  ? ? ?ED Results / Procedures / Treatments   ?Labs ?(all labs ordered are listed, but only abnormal results are displayed) ?Labs Reviewed - No data to display ? ?EKG ?None ? ?Radiology ?No results found. ? ?Procedures ?Procedures  ? ? ?Medications Ordered in ED ?Medications - No data to display ? ?ED Course/ Medical Decision Making/ A&P ?  ?                        ?Medical Decision Making ?Risk ?Prescription drug management. ? ? ?Patient presents with reported panic attack.  States feeling somewhat better but not back at baseline.  Discussed with patient.  No history of panic attacks.  Does use marijuana but otherwise denies drug use.  Do not use marijuana before the event.  Lungs clear.  Equal breath sounds.  Doubt pulmonary embolism.  Vital signs reassuring.  No tachycardia.  No hypoxia.  Normal heart rate.  Not losing weight.  Thyroid issues less likely.  Doubt new onset diabetes.  Appears stable for discharge.  Resources given.  Given work note as requested and also will give Vistaril for symptomatic treatment. ? ? ? ? ? ? ? ?Final Clinical Impression(s) / ED Diagnoses ?Final diagnoses:  ?Anxiety attack  ? ? ?Rx / DC Orders ?ED Discharge Orders   ? ?      Ordered  ?  hydrOXYzine (ATARAX) 25 MG tablet  Every 8 hours PRN       ? 09/29/21 1306  ? ?  ?  ? ?  ? ? ?  ?Benjiman Core, MD ?09/29/21 1343 ? ?

## 2021-09-29 NOTE — ED Notes (Signed)
ED Provider at bedside. 

## 2021-10-08 ENCOUNTER — Emergency Department (HOSPITAL_COMMUNITY)
Admission: EM | Admit: 2021-10-08 | Discharge: 2021-10-08 | Disposition: A | Discharge status: Home / Self Care | Payer: Managed Care, Other (non HMO) | Attending: Emergency Medicine | Admitting: Emergency Medicine

## 2021-10-08 ENCOUNTER — Other Ambulatory Visit: Payer: Self-pay

## 2021-10-08 ENCOUNTER — Encounter (HOSPITAL_COMMUNITY): Payer: Self-pay | Admitting: Emergency Medicine

## 2021-10-08 DIAGNOSIS — M545 Low back pain, unspecified: Secondary | ICD-10-CM | POA: Insufficient documentation

## 2021-10-08 MED ORDER — KETOROLAC TROMETHAMINE 30 MG/ML IJ SOLN
30.0000 mg | Freq: Once | INTRAMUSCULAR | Status: AC
  Administered 2021-10-08: 30 mg via INTRAMUSCULAR
  Filled 2021-10-08: qty 1

## 2021-10-08 MED ORDER — CYCLOBENZAPRINE HCL 10 MG PO TABS: 10.0000 mg | ORAL_TABLET | Freq: Two times a day (BID) | ORAL | 0 refills | Status: DC | PRN

## 2021-10-08 NOTE — ED Triage Notes (Signed)
Pt states he began having sharp back pain in his low back while at work tonight around 9pm. Pt reports that he wasn't lifting or bending when the pain began, that there were no precipitating factors for the pain. Pt reports and old back injury from about 3 years ago.  ?

## 2021-10-08 NOTE — ED Provider Notes (Signed)
?[facility]-EMERGENCY DEPT ?Provider Note ? ? ?CSN: [id number] ?Arrival date & time: 10/08/21  0016 ? ?  ? ?History ? ?Chief Complaint  ?Patient presents with  ? Back Pain  ? ? ?Aaron Fry is a 31 y.o. male. ? ?HPI ? ?Patient with nonsignificant medical history presents with complaints of lower back pain.  Patient has back pain started suddenly last night around 9 PM, states she was walking and started have pain.  States pain is mainly in his lower back, is worsened with movement improved with rest, does not radiate down his leg, denies any paresthesias or weakness into lower legs, denies saddle paresthesias, urinary incontinency retention denies any urinary symptoms or flank tenderness no stomach pains nausea vomiting.  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  He has no other complaints.  Has not had nothing for pain. ? ?Reviewed patient's chart and has been in the past for back pain, imaging was reviewed and was unremarkable. ? ?Home Medications ?Prior to Admission medications   ?Medication Sig Start Date End Date Taking? Authorizing Provider  ?cyclobenzaprine (FLEXERIL) 10 MG tablet Take 1 tablet (10 mg total) by mouth 2 (two) times daily as needed for muscle spasms. 10/08/21  Yes Carroll SageFaulkner, Samella Lucchetti J, PA-C  ?hydrOXYzine (ATARAX) 25 MG tablet Take 1 tablet (25 mg total) by mouth every 8 (eight) hours as needed for anxiety. 09/29/21   Benjiman CorePickering, Nathan, MD  ?methocarbamol (ROBAXIN) 500 MG tablet Take 1 tablet (500 mg total) by mouth every 8 (eight) hours as needed for muscle spasms. 08/30/21   Fayrene Helperran, [place], PA-C  ?naproxen (NAPROSYN) 500 MG tablet Take 1 tablet (500 mg total) by mouth 2 (two) times daily as needed for moderate pain. 08/30/21   Fayrene Helperran, [place], PA-C  ?famotidine (PEPCID) 20 MG tablet Take 1 tablet (20 mg total) by mouth 2 (two) times daily. 05/30/19 03/12/20  Gilda CreasePollina, Christopher J, MD  ?   ? ?Allergies     ?Patient has no known allergies.   ? ?Review of Systems   ?Review of Systems  ?Constitutional:  Negative for chills and fever.  ?Respiratory:  Negative for shortness of breath.   ?Cardiovascular:  Negative for chest pain.  ?Gastrointestinal:  Negative for abdominal pain.  ?Musculoskeletal:  Positive for back pain.  ?Neurological:  Negative for headaches.  ? ?Physical Exam ?Updated Vital Signs ?BP 116/76 (BP Location: Right Arm)   Pulse 81   Temp 97.8 ?F (36.6 ?C) (Oral)   Resp 18   Ht 5\' 9"  (1.753 m)   Wt 65.3 kg   SpO2 96%   BMI 21.27 kg/m?  ?Physical Exam ?Vitals and nursing note reviewed.  ?Constitutional:   ?   General: He is not in acute distress. ?   Appearance: He is not ill-appearing.  ?HENT:  ?   Head: Normocephalic and atraumatic.  ?   Nose: No congestion.  ?Eyes:  ?   Conjunctiva/sclera: Conjunctivae normal.  ?Cardiovascular:  ?   Rate and Rhythm: Normal rate and regular rhythm.  ?   Pulses: Normal pulses.  ?   Heart sounds: No murmur heard. ?  No friction rub. No gallop.  ?Pulmonary:  ?   Effort: No respiratory distress.  ?   Breath sounds: No wheezing, rhonchi or rales.  ?Abdominal:  ?   Palpations: Abdomen is soft.  ?   Tenderness: There is no abdominal tenderness. There is no right CVA tenderness or  left CVA tenderness.  ?Musculoskeletal:  ?   Comments: Spine was palpated nontender to palpation no step-off or deformities noted, there is no overlying skin changes, patient has noted tenderness along the paraspinal muscles in the lower lumbar region, pain was focalized and reproducible.  He has full range of motion 5-5 strength neurovascularly intact in the lower extremities negative straight leg raise.  Gait fully intact  ?Skin: ?   General: Skin is warm and dry.  ?Neurological:  ?   Mental Status: He is alert.  ?Psychiatric:     ?   Mood and Affect: Mood normal.  ? ? ?ED Results / Procedures / Treatments   ?Labs ?(all labs ordered are listed, but only abnormal results are displayed) ?Labs  Reviewed - No data to display ? ?EKG ?None ? ?Radiology ?No results found. ? ?Procedures ?Procedures  ? ? ?Medications Ordered in ED ?Medications  ?ketorolac (TORADOL) 30 MG/ML injection 30 mg (has no administration in time range)  ? ? ?ED Course/ Medical Decision Making/ A&P ?  ?                        ?Medical Decision Making ? ?This patient presents to the ED for concern of back pain, this involves an extensive number of treatment options, and is a complaint that carries with it a high risk of complications and morbidity.  The differential diagnosis includes spinal equina, dissection, kidney stone, fracture, dislocation ? ? ? ?Additional history obtained: ? ?Additional history obtained from N/A ?External records from outside source obtained and reviewed including previous imaging ER notes ? ? ?Co morbidities that complicate the patient evaluation ? ?N/A ? ?Social Determinants of Health: ? ?No primary care provider- will provide patient with PCP upon discharge ? ? ? ?Lab Tests: ? ?I Ordered, and personally interpreted labs.  The pertinent results include: N/A ? ? ?Imaging Studies ordered: ? ?I ordered imaging studies including N/A ?I independently visualized and interpreted imaging which showed N/A ?I agree with the radiologist interpretation ? ? ?Cardiac Monitoring: ? ?The patient was maintained on a cardiac monitor.  I personally viewed and interpreted the cardiac monitored which showed an underlying rhythm of: N/A ? ? ?Medicines ordered and prescription drug management: ? ?I ordered medication including Toradol for pain ?I have reviewed the patients home medicines and have made adjustments as needed ? ?Critical Interventions: ? ?N/A ? ? ?Reevaluation: ? ?Presents with back pain, he is ambulatory, he has a benign physical exam, no traumatic injury associate with this pain likely this is muscular will provide with pain medication and discharge he is agreement this plan. ? ?Consultations  Obtained: ? ?N/A ? ? ? ?Test Considered: ? ? will defer on CT lumbar spine as there is no traumatic injury associate with this pain, my suspicion for fracture dislocation very low at this time. ? ? ? ?Rule out ?I have low suspicion for spinal fracture or spinal cord abnormality as patient denies urinary incontinency, retention, difficulty with bowel movements, denies saddle paresthesias.  Spine was palpated there is no step-off, crepitus or gross deformities felt, patient had 5/5 strength, full range of motion, neurovascular fully intact in the lower extremities.  Low suspicion for dissection as presentation atypical, pain is localized and reproducible worse with movement he has low risk factors More consistent with muscular strain.  Low suspicion for UTI [place]o, kidney stone denies any urinary symptoms, no suprapubic tenderness, no flank tenderness.  Low suspicion for septic arthritis  as patient denies IV drug use, skin exam was performed no erythematous, edema or warm joints noted. ? ? ? ? ?Dispostion and problem list ? ?After consideration of the diagnostic results and the patients response to treatment, I feel that the patent would benefit from discharge. ? ?Back pain-muscular strain, will provide with muscle relaxer, recommend over-the-counter pain medications, follow-up PCP for further evaluation.  Strict return precautions. ? ? ? ? ? ? ? ? ? ? ? ?Final Clinical Impression(s) / ED Diagnoses ?Final diagnoses:  ?Acute bilateral low back pain without sciatica  ? ? ?Rx / DC Orders ?ED Discharge Orders   ? ?      Ordered  ?  cyclobenzaprine (FLEXERIL) 10 MG tablet  2 times daily PRN       ? 10/08/21 0442  ? ?  ?  ? ?  ? ? ?  ?Carroll Sage, PA-C ?10/08/21 1572 ? ?  ?Paula Libra, MD ?10/08/21 6203 ? ?

## 2021-10-08 NOTE — Discharge Instructions (Signed)
You have been seen here for back pain, I recommend taking over-the-counter pain medications like ibuprofen and/or Tylenol every 6 as needed.  Please follow dosage and on the back of bottle.  I also recommend applying heat to the area and stretching out the muscles as this will help decrease stiffness and pain.  I have given you information on exercises please follow. ? ?Please follow-up with community health and wellness for further eval. ? ?Come back to the emergency department if you develop chest pain, shortness of breath, severe abdominal pain, uncontrolled nausea, vomiting, diarrhea. ? ? ?

## 2021-10-28 ENCOUNTER — Encounter (HOSPITAL_BASED_OUTPATIENT_CLINIC_OR_DEPARTMENT_OTHER): Payer: Self-pay | Admitting: Emergency Medicine

## 2021-10-28 ENCOUNTER — Other Ambulatory Visit: Payer: Self-pay

## 2021-10-28 ENCOUNTER — Emergency Department (HOSPITAL_BASED_OUTPATIENT_CLINIC_OR_DEPARTMENT_OTHER)
Admission: EM | Admit: 2021-10-28 | Discharge: 2021-10-28 | Disposition: A | Discharge status: Home / Self Care | Payer: Managed Care, Other (non HMO) | Attending: Emergency Medicine | Admitting: Emergency Medicine

## 2021-10-28 DIAGNOSIS — R55 Syncope and collapse: Secondary | ICD-10-CM | POA: Diagnosis present

## 2021-10-28 DIAGNOSIS — F17200 Nicotine dependence, unspecified, uncomplicated: Secondary | ICD-10-CM | POA: Insufficient documentation

## 2021-10-28 DIAGNOSIS — R109 Unspecified abdominal pain: Secondary | ICD-10-CM | POA: Insufficient documentation

## 2021-10-28 DIAGNOSIS — R42 Dizziness and giddiness: Secondary | ICD-10-CM | POA: Insufficient documentation

## 2021-10-28 LAB — CBC
HCT: 46.2 % (ref 39.0–52.0)
Hemoglobin: 15.9 g/dL (ref 13.0–17.0)
MCH: 32.3 pg (ref 26.0–34.0)
MCHC: 34.4 g/dL (ref 30.0–36.0)
MCV: 93.9 fL (ref 80.0–100.0)
Platelets: 245 10*3/uL (ref 150–400)
RBC: 4.92 MIL/uL (ref 4.22–5.81)
RDW: 12.4 % (ref 11.5–15.5)
WBC: 9.3 10*3/uL (ref 4.0–10.5)
nRBC: 0 % (ref 0.0–0.2)

## 2021-10-28 LAB — BASIC METABOLIC PANEL
Anion gap: 7 (ref 5–15)
BUN: 13 mg/dL (ref 6–20)
CO2: 29 mmol/L (ref 22–32)
Calcium: 9.7 mg/dL (ref 8.9–10.3)
Chloride: 102 mmol/L (ref 98–111)
Creatinine, Ser: 0.82 mg/dL (ref 0.61–1.24)
GFR, Estimated: >60 mL/min (ref >60)
Glucose, Bld: 102 mg/dL — ABNORMAL HIGH (ref 70–99)
Potassium: 3.5 mmol/L (ref 3.5–5.1)
Sodium: 138 mmol/L (ref 135–145)

## 2021-10-28 LAB — CBG MONITORING, ED: Glucose-Capillary: 117 mg/dL — ABNORMAL HIGH (ref 70–99)

## 2021-10-28 NOTE — ED Triage Notes (Addendum)
Pt arrives pov with driver, steady gait c/o "acute muscle stiffness" and dizziness prior to loc episode, then had loc 10-15 minutes after occurrence, unknown how long was unconscious. Denies fall.  Denies hx of seizures, denies recent illness. Denies HA. Was treated by EMS after episode

## 2021-10-28 NOTE — ED Provider Notes (Signed)
[facility] EMERGENCY DEPARTMENT Provider Note   CSN: [id number] Arrival date & time: 10/28/21  2023     History  Chief Complaint  Patient presents with   Loss of Consciousness    Aaron Fry is a 31 y.o. male.  The history is provided by the patient and medical records.  Loss of Consciousness Aaron Fry is a 31 y.o. male who presents to the Emergency Department complaining of syncope.  He presents to the ED for evaluation of what he thinks was a seizure.  He was working for about 2 hours and started to feel dizzy and felt like his body was stiffening up.  He sat down after feeling dizzy for about five minutes.  When he sat down his body locked up (stiffened).  He lost consciousness - unknown amount of time.   No loss of bowel/bladder.  EMS was called and pt states he felt well when they arrived.   No recent illlnesses. No medical problems, no medications.  No recent surgeries.   Uses tobacco.  No alcohol.  No drugs.    Had abdominal pain just prior to the event, described as mild pain.    Mother with hx/o DM.      Home Medications Prior to Admission medications   Medication Sig Start Date End Date Taking? Authorizing Provider  cyclobenzaprine (FLEXERIL) 10 MG tablet Take 1 tablet (10 mg total) by mouth 2 (two) times daily as needed for muscle spasms. 10/08/21   Carroll Sage, PA-C  hydrOXYzine (ATARAX) 25 MG tablet Take 1 tablet (25 mg total) by mouth every 8 (eight) hours as needed for anxiety. 09/29/21   Benjiman Core, MD  methocarbamol (ROBAXIN) 500 MG tablet Take 1 tablet (500 mg total) by mouth every 8 (eight) hours as needed for muscle spasms. 08/30/21   Fayrene Helper, PA-C  naproxen (NAPROSYN) 500 MG tablet Take 1 tablet (500 mg total) by mouth 2 (two) times daily as needed for moderate pain. 08/30/21   Fayrene Helper, PA-C  famotidine (PEPCID) 20 MG tablet Take 1 tablet (20 mg total) by mouth 2 (two) times daily. 05/30/19 03/12/20  Gilda Crease, MD      Allergies    Patient has no known allergies.    Review of Systems   Review of Systems  Cardiovascular:  Positive for syncope.  All other systems reviewed and are negative.  Physical Exam Updated Vital Signs BP (!) 104/57   Pulse 66   Temp 98.2 F (36.8 C) (Oral)   Resp 17   Ht 5\' 9"  (1.753 m)   Wt 65.8 kg   SpO2 98%   BMI 21.41 kg/m  Physical Exam Vitals and nursing note reviewed.  Constitutional:      Appearance: He is well-developed.  HENT:     Head: Normocephalic and atraumatic.  Cardiovascular:     Rate and Rhythm: Normal rate and regular rhythm.     Heart sounds: No murmur heard. Pulmonary:     Effort: Pulmonary effort is normal. No respiratory distress.     Breath sounds: Normal breath sounds.  Abdominal:     Palpations: Abdomen is soft.     Tenderness: There is no abdominal tenderness. There is no guarding or rebound.  Musculoskeletal:        General: No swelling or tenderness.  Skin:    General: Skin is warm and dry.  Neurological:     Mental Status: He is alert and oriented to person, place, and  time.     Comments: No asymmetry of facial movements, visual fields grossly intact.  No pronator drift.  5/5 strength in all four extremities, sensation to light touch intact in all four extremities.  Psychiatric:        Behavior: Behavior normal.    ED Results / Procedures / Treatments   Labs (all labs ordered are listed, but only abnormal results are displayed) Labs Reviewed  BASIC METABOLIC PANEL - Abnormal; Notable for the following components:      Result Value   Glucose, Bld 102 (*)    All other components within normal limits  CBG MONITORING, ED - Abnormal; Notable for the following components:   Glucose-Capillary 117 (*)    All other components within normal limits  CBC  CBG MONITORING, ED    EKG EKG Interpretation  Date/Time:  Sunday Oct 28 2021 20:31:41 EDT Ventricular Rate:  72 PR Interval:  134 QRS  Duration: 94 QT Interval:  386 QTC Calculation: 422 R Axis:   105 Text Interpretation: Normal sinus rhythm with sinus arrhythmia Rightward axis Borderline ECG When compared with ECG of 21-Jun-2021 08:34, PREVIOUS ECG IS PRESENT Confirmed by Alvira Monday ([id number]) on 10/28/2021 10:38:14 PM  Radiology No results found.  Procedures Procedures   EMERGENCY DEPARTMENT ULTRASOUND  Study: Limited Retroperitoneal Ultrasound of the Abdominal Aorta.  INDICATIONS:Abdominal pain Multiple views of the abdominal aorta were obtained in real-time from the diaphragmatic hiatus to the aortic bifurcation in transverse planes with a multi-frequency probe.  PERFORMED BY: Myself IMAGES ARCHIVED?: Yes LIMITATIONS:  None INTERPRETATION:  No abdominal aortic aneurysm   Medications Ordered in ED Medications - No data to display  ED Course/ Medical Decision Making/ A&P                           Medical Decision Making Amount and/or Complexity of Data Reviewed Labs: ordered.   Patient with history of anxiety here for evaluation following syncopal events.  He did have mild transient abdominal discomfort prior to the episode, abdominal discomfort is resolved at time of assessment.  On evaluation he is well-appearing with no focal neurologic deficits, no abdominal tenderness.  Abdominal ultrasound at bedside is negative for AAA.  EKG without evidence of arrhythmia.  CBC without anemia.  No significant electrolyte abnormality.  Patient is well-appearing at this time.  Doubt PE.  Doubt hemorrhage.  History is not convincing for seizure episode.  Patient does not currently drive.  Unclear source of his syncopal episode.  Feel he is stable to discharge home with close outpatient follow-up and return precautions.       Final Clinical Impression(s) / ED Diagnoses Final diagnoses:  Syncope and collapse    Rx / DC Orders ED Discharge Orders     None         Tilden Fossa, MD 10/29/21 [phone]

## 2021-10-28 NOTE — ED Notes (Signed)
ED Provider at bedside. 

## 2021-10-28 NOTE — ED Notes (Signed)
VAN neg, bilaterally equal

## 2021-11-09 ENCOUNTER — Emergency Department (HOSPITAL_COMMUNITY)
Admission: EM | Admit: 2021-11-09 | Discharge: 2021-11-09 | Disposition: A | Discharge status: Home / Self Care | Payer: Managed Care, Other (non HMO) | Attending: Emergency Medicine | Admitting: Emergency Medicine

## 2021-11-09 ENCOUNTER — Encounter (HOSPITAL_COMMUNITY): Payer: Self-pay

## 2021-11-09 ENCOUNTER — Emergency Department (HOSPITAL_COMMUNITY): Payer: Managed Care, Other (non HMO)

## 2021-11-09 ENCOUNTER — Other Ambulatory Visit: Payer: Self-pay

## 2021-11-09 DIAGNOSIS — R072 Precordial pain: Secondary | ICD-10-CM | POA: Insufficient documentation

## 2021-11-09 DIAGNOSIS — R079 Chest pain, unspecified: Secondary | ICD-10-CM

## 2021-11-09 LAB — CBC
HCT: 42 % (ref 39.0–52.0)
Hemoglobin: 14.4 g/dL (ref 13.0–17.0)
MCH: 33 pg (ref 26.0–34.0)
MCHC: 34.3 g/dL (ref 30.0–36.0)
MCV: 96.3 fL (ref 80.0–100.0)
Platelets: 237 10*3/uL (ref 150–400)
RBC: 4.36 MIL/uL (ref 4.22–5.81)
RDW: 12.5 % (ref 11.5–15.5)
WBC: 6.7 10*3/uL (ref 4.0–10.5)
nRBC: 0 % (ref 0.0–0.2)

## 2021-11-09 LAB — BASIC METABOLIC PANEL
Anion gap: 7 (ref 5–15)
BUN: 6 mg/dL (ref 6–20)
CO2: 28 mmol/L (ref 22–32)
Calcium: 9.1 mg/dL (ref 8.9–10.3)
Chloride: 105 mmol/L (ref 98–111)
Creatinine, Ser: 0.64 mg/dL (ref 0.61–1.24)
GFR, Estimated: >60 mL/min (ref >60)
Glucose, Bld: 103 mg/dL — ABNORMAL HIGH (ref 70–99)
Potassium: 3.7 mmol/L (ref 3.5–5.1)
Sodium: 140 mmol/L (ref 135–145)

## 2021-11-09 LAB — TROPONIN I (HIGH SENSITIVITY)
Troponin I (High Sensitivity): <2 ng/L (ref <18)
Troponin I (High Sensitivity): <2 ng/L (ref <18)

## 2021-11-09 MED ORDER — ALUM & MAG HYDROXIDE-SIMETH 200-200-20 MG/5ML PO SUSP: 30.0000 mL | Freq: Once | ORAL | Status: DC

## 2021-11-09 MED ORDER — LIDOCAINE VISCOUS HCL 2 % MT SOLN: 15.0000 mL | Freq: Once | OROMUCOSAL | Status: DC

## 2021-11-09 NOTE — ED Triage Notes (Signed)
Reports central cp that started last night associated with mild shortness of breath.  Denies any other symptoms.

## 2021-11-09 NOTE — ED Provider Notes (Signed)
[facility] EMERGENCY DEPARTMENT Provider Note   CSN: [id number] Arrival date & time: 11/09/21  1335     History  Chief Complaint  Patient presents with   Chest Pain    Aaron Fry is a 31 y.o. male presented ED with sharp substernal chest discomfort that woke him from sleep last night and kept him up all night, currently very mild.  Never had a before.  HPI     Home Medications Prior to Admission medications   Medication Sig Start Date End Date Taking? Authorizing Provider  cyclobenzaprine (FLEXERIL) 10 MG tablet Take 1 tablet (10 mg total) by mouth 2 (two) times daily as needed for muscle spasms. 10/08/21   Carroll Sage, PA-C  hydrOXYzine (ATARAX) 25 MG tablet Take 1 tablet (25 mg total) by mouth every 8 (eight) hours as needed for anxiety. 09/29/21   Benjiman Core, MD  methocarbamol (ROBAXIN) 500 MG tablet Take 1 tablet (500 mg total) by mouth every 8 (eight) hours as needed for muscle spasms. 08/30/21   Fayrene Helper, PA-C  naproxen (NAPROSYN) 500 MG tablet Take 1 tablet (500 mg total) by mouth 2 (two) times daily as needed for moderate pain. 08/30/21   Fayrene Helper, PA-C  famotidine (PEPCID) 20 MG tablet Take 1 tablet (20 mg total) by mouth 2 (two) times daily. 05/30/19 03/12/20  Gilda Crease, MD      Allergies    Patient has no known allergies.    Review of Systems   Review of Systems  Physical Exam Updated Vital Signs BP 115/60 (BP Location: Right Arm)   Pulse 66   Temp 98 F (36.7 C) (Oral)   Resp 17   Ht 5\' 9"  (1.753 m)   Wt 65.8 kg   SpO2 95%   BMI 21.41 kg/m  Physical Exam Constitutional:      General: He is not in acute distress. HENT:     Head: Normocephalic and atraumatic.  Eyes:     Conjunctiva/sclera: Conjunctivae normal.     Pupils: Pupils are equal, round, and reactive to light.  Cardiovascular:     Rate and Rhythm: Normal rate and regular rhythm.  Pulmonary:     Effort: Pulmonary effort is normal. No  respiratory distress.  Abdominal:     General: There is no distension.     Tenderness: There is no abdominal tenderness.  Skin:    General: Skin is warm and dry.  Neurological:     General: No focal deficit present.     Mental Status: He is alert. Mental status is at baseline.  Psychiatric:        Mood and Affect: Mood normal.        Behavior: Behavior normal.    ED Results / Procedures / Treatments   Labs (all labs ordered are listed, but only abnormal results are displayed) Labs Reviewed  BASIC METABOLIC PANEL - Abnormal; Notable for the following components:      Result Value   Glucose, Bld 103 (*)    All other components within normal limits  CBC  TROPONIN I (HIGH SENSITIVITY)  TROPONIN I (HIGH SENSITIVITY)    EKG None  Radiology DG Chest 1 View  Result Date: 11/09/2021 CLINICAL DATA:  Chest pain EXAM: CHEST  1 VIEW COMPARISON:  06/11/2021 FINDINGS: The heart size and mediastinal contours are within normal limits. Both lungs are clear. The visualized skeletal structures are unremarkable. IMPRESSION: No active disease. Electronically Signed   By: 08/09/2021  D.O.   On: 11/09/2021 14:00    Procedures Procedures    Medications Ordered in ED Medications  alum & mag hydroxide-simeth (MAALOX/MYLANTA) 200-200-20 MG/5ML suspension 30 mL (has no administration in time range)    And  lidocaine (XYLOCAINE) 2 % viscous mouth solution 15 mL (has no administration in time range)    ED Course/ Medical Decision Making/ A&P                           Medical Decision Making  This patient presents to the Emergency Department with complaint of chest pain. The differential diagnosis includes ACS vs Pneumothorax vs Reflux/Gastritis vs MSK pain vs Pneumonia vs other.  I suspect is most likely gastritis versus costochondritis based on his presentation  I felt PE was less likely given that he has no hypoxia, no tachycardia, no risk factors, PERC negative  I ordered, reviewed,  and interpreted labs.  Pertinent results include troponins unremarkable, labs unremarkable I ordered imaging studies which included x-ray of the chest I independently visualized and interpreted imaging which showed no pneumothorax or pneumonia and the monitor tracing which showed normal sinus rhythm. I agree with the radiologist interpretation I personally reviewed the patients ECG which showed sinus rhythm with no acute ischemic findings  After the interventions stated above, I reevaluated the patient and found that they were stable, well-appearing, comfortable  Based on the patient's clinical exam, vital signs, risk factors, and ED testing, I felt that the patient's overall risk of life-threatening emergency such as ACS, PE, sepsis, or infection was low.  At this time, I felt the patient's presentation was most clinically consistent with nonspecific chest pain, but explained to the patient that this evaluation was not a definitive diagnostic workup.  I discussed outpatient follow up with primary care provider, and provided specialist office number on the patient's discharge paper if a referral was deemed necessary.  Return precautions were discussed with the patient.  I felt the patient was clinically stable for discharge.           Final Clinical Impression(s) / ED Diagnoses Final diagnoses:  Nonspecific chest pain    Rx / DC Orders ED Discharge Orders     None         Terald Sleeper, MD 11/09/21 2027

## 2021-11-09 NOTE — ED Provider Triage Note (Signed)
Emergency Medicine Provider Triage Evaluation Note  Aaron Fry , a 31 y.o. male  was evaluated in triage.  Pt complains of cp. Report substernal CP with mild SOB which started last night and have persisted.  No other concerning sxs.  Rates pain as 7/10.  No significant cardiac hx.  No hx of GERD  Review of Systems  Positive: As above Negative: As above  Physical Exam  BP 122/61 (BP Location: Right Arm)   Pulse 68   Temp 98.2 F (36.8 C) (Oral)   Resp 14   Ht 5\' 9"  (1.753 m)   Wt 65.8 kg   SpO2 98%   BMI 21.41 kg/m  Gen:   Awake, no distress   Resp:  Normal effort  MSK:   Moves extremities without difficulty  Other:    Medical Decision Making  Medically screening exam initiated at 1:42 PM.  Appropriate orders placed.  was informed that the remainder of the evaluation will be completed by another provider, this initial triage assessment does not replace that evaluation, and the importance of remaining in the ED until their evaluation is complete.     Yvonne Kendall, PA-C 11/09/21 1345

## 2021-12-03 ENCOUNTER — Emergency Department (HOSPITAL_COMMUNITY)
Admission: EM | Admit: 2021-12-03 | Discharge: 2021-12-03 | Disposition: A | Discharge status: Home / Self Care | Payer: Managed Care, Other (non HMO) | Attending: Emergency Medicine | Admitting: Emergency Medicine

## 2021-12-03 ENCOUNTER — Other Ambulatory Visit: Payer: Self-pay

## 2021-12-03 ENCOUNTER — Encounter (HOSPITAL_COMMUNITY): Payer: Self-pay

## 2021-12-03 DIAGNOSIS — M79605 Pain in left leg: Secondary | ICD-10-CM | POA: Diagnosis present

## 2021-12-03 MED ORDER — NAPROXEN 375 MG PO TABS: 375.0000 mg | ORAL_TABLET | Freq: Two times a day (BID) | ORAL | 0 refills | Status: DC

## 2021-12-03 MED ORDER — METHOCARBAMOL 500 MG PO TABS: 500.0000 mg | ORAL_TABLET | Freq: Two times a day (BID) | ORAL | 0 refills | Status: DC

## 2021-12-03 NOTE — ED Triage Notes (Signed)
Patient c/o left lower leg pain from knee to the foot. Patient denies any swelling or recent injuries.

## 2021-12-03 NOTE — ED Provider Notes (Signed)
[facility]-EMERGENCY DEPT Provider Note   CSN: [id number] Arrival date & time: 12/03/21  1121     History  Chief Complaint  Patient presents with   Leg Pain    Aaron Fry is a 31 y.o. male.  31 year old male presents today for evaluation of intermittent left lower extremity pain for the past 3 days.  Denies any injuries, or falls.  Per chart review patient has had similar symptoms in the past.  However patient does not mention this.  He has not taken anything prior to arrival.  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  He is requesting a work note.  He is without back pain, saddle anesthesia, difficulty with bowel, or bladder control.  Denies IV drug use, history of cancer, or unintentional weight loss.  The history is provided by the patient. No language interpreter was used.       Home Medications Prior to Admission medications   Medication Sig Start Date End Date Taking? Authorizing Provider  cyclobenzaprine (FLEXERIL) 10 MG tablet Take 1 tablet (10 mg total) by mouth 2 (two) times daily as needed for muscle spasms. 10/08/21   Carroll Sage, PA-C  hydrOXYzine (ATARAX) 25 MG tablet Take 1 tablet (25 mg total) by mouth every 8 (eight) hours as needed for anxiety. 09/29/21   Benjiman Core, MD  methocarbamol (ROBAXIN) 500 MG tablet Take 1 tablet (500 mg total) by mouth every 8 (eight) hours as needed for muscle spasms. 08/30/21   Fayrene Helper, PA-C  naproxen (NAPROSYN) 500 MG tablet Take 1 tablet (500 mg total) by mouth 2 (two) times daily as needed for moderate pain. 08/30/21   Fayrene Helper, PA-C  famotidine (PEPCID) 20 MG tablet Take 1 tablet (20 mg total) by mouth 2 (two) times daily. 05/30/19 03/12/20  Gilda Crease, MD      Allergies    Patient has no known allergies.    Review of Systems   Review of Systems  Constitutional:  Negative for chills and fever.  Genitourinary:  Negative for difficulty urinating.   Musculoskeletal:  Negative for arthralgias, back pain and joint swelling.  Neurological:  Negative for weakness and numbness.  All other systems reviewed and are negative.   Physical Exam Updated Vital Signs BP 107/71 (BP Location: Left Arm)   Pulse 71   Temp 98 F (36.7 C) (Oral)   Resp 18   Ht 5\' 9"  (1.753 m)   Wt 65.8 kg   SpO2 95%   BMI 21.41 kg/m  Physical Exam Vitals and nursing note reviewed.  Constitutional:      General: He is not in acute distress.    Appearance: Normal appearance. He is not ill-appearing.  HENT:     Head: Normocephalic and atraumatic.     Nose: Nose normal.  Eyes:     Conjunctiva/sclera: Conjunctivae normal.  Cardiovascular:     Rate and Rhythm: Normal rate and regular rhythm.  Pulmonary:     Effort: Pulmonary effort is normal. No respiratory distress.  Musculoskeletal:        General: No deformity.     Right lower leg: No edema.     Left lower leg: No edema.     Comments: Cervical, thoracic, lumbar spine without tenderness to palpation.  Full range of motion bilateral lower extremities with 5/5 strength in extensor and flexor muscle groups.  2+ DP pulse present on the left with brisk cap refill.  Sensation intact.  He is able  to ambulate without difficulty.  There is mild tenderness to palpation present along the lower left lower extremity.  Skin:    Findings: No rash.  Neurological:     Mental Status: He is alert.     ED Results / Procedures / Treatments   Labs (all labs ordered are listed, but only abnormal results are displayed) Labs Reviewed - No data to display  EKG None  Radiology No results found.  Procedures Procedures    Medications Ordered in ED Medications - No data to display  ED Course/ Medical Decision Making/ A&P                           Medical Decision Making  31 year old male presents today for evaluation of intermittent left lower extremity pain of about 3-day duration.  Per chart review patient has  presented with similar symptoms in the same extremity in the past.  Denies any injury.  Does not take anything prior to arrival.  Has not followed up with Ortho.  Exam is reassuring.  Without back pain, red flag signs or symptoms concerning for concerning back etiology of his symptoms such as cauda equina, or spinal epidural abscess.  Will provide Robaxin, and naproxen.  Will provide Ortho follow-up.   Final Clinical Impression(s) / ED Diagnoses Final diagnoses:  Left leg pain    Rx / DC Orders ED Discharge Orders          Ordered    naproxen (NAPROSYN) 375 MG tablet  2 times daily        12/03/21 1237    methocarbamol (ROBAXIN) 500 MG tablet  2 times daily        12/03/21 1237              Marita Kansas, PA-C 12/03/21 1237    Wynetta Fines, MD 12/04/21 1454

## 2021-12-29 ENCOUNTER — Emergency Department (HOSPITAL_COMMUNITY)
Admission: EM | Admit: 2021-12-29 | Discharge: 2021-12-29 | Disposition: A | Discharge status: Home / Self Care | Payer: Managed Care, Other (non HMO) | Attending: Emergency Medicine | Admitting: Emergency Medicine

## 2021-12-29 ENCOUNTER — Encounter (HOSPITAL_COMMUNITY): Payer: Self-pay

## 2021-12-29 DIAGNOSIS — M26642 Arthritis of left temporomandibular joint: Secondary | ICD-10-CM | POA: Diagnosis not present

## 2021-12-29 DIAGNOSIS — R519 Headache, unspecified: Secondary | ICD-10-CM | POA: Diagnosis present

## 2021-12-29 MED ORDER — IBUPROFEN 600 MG PO TABS: 600.0000 mg | ORAL_TABLET | Freq: Three times a day (TID) | ORAL | 0 refills | Status: AC

## 2021-12-29 MED ORDER — KETOROLAC TROMETHAMINE 15 MG/ML IJ SOLN
15.0000 mg | Freq: Once | INTRAMUSCULAR | Status: AC
Start: 2021-12-29 — End: 2021-12-29
  Administered 2021-12-29: 15 mg via INTRAMUSCULAR
  Filled 2021-12-29: qty 1

## 2021-12-29 NOTE — ED Notes (Signed)
C/o intermittent sharp L sided facial pain x3wks w/ no aggravating or alleviating factors. Cn2-12 grossly intact. Strength 5/5 all extremities. Also c/o similar pain on dorsal aspect of L hand x3days that is worse with gripping. No open cuts, sores, swelling, redness, or discoloration. Pt is a Insurance claims handler.

## 2021-12-29 NOTE — ED Triage Notes (Signed)
Patient said he has had left sided facial pain for 3 weeks. Then his left hand has had sharp pain for 2 days. Does not remember injuring either.

## 2021-12-29 NOTE — Discharge Instructions (Signed)
Likely pain is from inflammation around your TMJ, I have given you pain medication please take as prescribed.  Please follow-up with a dentist for further evaluation.  Come back to the emergency department if you develop chest pain, shortness of breath, severe abdominal pain, uncontrolled nausea, vomiting, diarrhea.

## 2021-12-29 NOTE — ED Provider Notes (Signed)
[facility]-EMERGENCY DEPT Provider Note   CSN: [id number] Arrival date & time: 12/29/21  0028     History  Chief Complaint  Patient presents with   Facial Pain    Aaron Fry is a 31 y.o. male.  HPI  Patient without significant medical history presents with complaints of left-sided facial pain.  Patient has been going on for last 3 weeks, states pain is intermittent, is mainly at nighttime, pain is on his left TMJ, he has no dental pain no tongue throat lip swelling difficulty breathing, denies any ear pain or tinnitus, no headache change in vision paresthesia or weakness of upper or lower extremities he has no other complaints.  No been taking thing for pain.    Home Medications Prior to Admission medications   Medication Sig Start Date End Date Taking? Authorizing Provider  ibuprofen (ADVIL) 600 MG tablet Take 1 tablet (600 mg total) by mouth 3 (three) times daily for 7 days. 12/29/21 01/05/22 Yes Carroll Sage, PA-C  cyclobenzaprine (FLEXERIL) 10 MG tablet Take 1 tablet (10 mg total) by mouth 2 (two) times daily as needed for muscle spasms. 10/08/21   Carroll Sage, PA-C  hydrOXYzine (ATARAX) 25 MG tablet Take 1 tablet (25 mg total) by mouth every 8 (eight) hours as needed for anxiety. 09/29/21   Benjiman Core, MD  methocarbamol (ROBAXIN) 500 MG tablet Take 1 tablet (500 mg total) by mouth 2 (two) times daily. 12/03/21   Marita Kansas, PA-C  naproxen (NAPROSYN) 375 MG tablet Take 1 tablet (375 mg total) by mouth 2 (two) times daily. 12/03/21   Marita Kansas, PA-C  famotidine (PEPCID) 20 MG tablet Take 1 tablet (20 mg total) by mouth 2 (two) times daily. 05/30/19 03/12/20  Gilda Crease, MD      Allergies    Patient has no known allergies.    Review of Systems   Review of Systems  Constitutional:  Negative for chills and fever.  HENT:  Negative for dental problem, facial swelling and sore throat.   Respiratory:  Negative for shortness  of breath.   Cardiovascular:  Negative for chest pain.  Gastrointestinal:  Negative for abdominal pain.  Neurological:  Negative for headaches.    Physical Exam Updated Vital Signs BP (!) 121/55   Pulse 79   Temp 98 F (36.7 C) (Oral)   Resp 17   Ht 5\' 9"  (1.753 m)   Wt 65.3 kg   SpO2 96%   BMI 21.27 kg/m  Physical Exam Vitals and nursing note reviewed.  Constitutional:      General: He is not in acute distress.    Appearance: He is not ill-appearing.  HENT:     Head: Normocephalic and atraumatic.     Right Ear: Tympanic membrane, ear canal and external ear normal.     Left Ear: Tympanic membrane, ear canal and external ear normal.     Ears:     Comments: No ear protrusion no tenderness behind the mastoids no overlying skin changes.    Nose: No congestion.     Mouth/Throat:     Mouth: Mucous membranes are moist.     Pharynx: Oropharynx is clear. No oropharyngeal exudate or posterior oropharyngeal erythema.     Comments: No trismus no torticollis no oral edema, no submandibular swelling, controlling oral secretions tongue and uvula  both midline, tonsils are equal symmetrical bilaterally.  Patient is poor dental hygiene, but no evidence of gingivitis, no palpable fluctuance induration, gumline  was nontender.  Left TMJ is tender, with crepitus present. Eyes:     Extraocular Movements: Extraocular movements intact.  Cardiovascular:     Rate and Rhythm: Normal rate and regular rhythm.  Pulmonary:     Effort: Pulmonary effort is normal.  Skin:    General: Skin is warm and dry.  Neurological:     Mental Status: He is alert.     Comments: No facial asymmetry, no difficulty with word finding, following two-step commands no unilateral weakness.  Psychiatric:        Mood and Affect: Mood normal.     ED Results / Procedures / Treatments   Labs (all labs ordered are listed, but only abnormal results are displayed) Labs Reviewed - No data to  display  EKG None  Radiology No results found.  Procedures Procedures    Medications Ordered in ED Medications  ketorolac (TORADOL) 15 MG/ML injection 15 mg (15 mg Intramuscular Given 12/29/21 0138)    ED Course/ Medical Decision Making/ A&P                           Medical Decision Making Risk Prescription drug management.   This patient presents to the ED for concern of left facial pain, this involves an extensive number of treatment options, and is a complaint that carries with it a high risk of complications and morbidity.  The differential diagnosis includes retropharyngeal/peritonsillar abscess, mastoiditis, otitis media, dental abscess    Additional history obtained:  Additional history obtained from N/A External records from outside source obtained and reviewed including previous ED notes   Co morbidities that complicate the patient evaluation  N/A  Social Determinants of Health:  N/A    Lab Tests:  I Ordered, and personally interpreted labs.  The pertinent results include: N/A   Imaging Studies ordered:  I ordered imaging studies including N/A I independently visualized and interpreted imaging which showed N/A I agree with the radiologist interpretation   Cardiac Monitoring:  The patient was maintained on a cardiac monitor.  I personally viewed and interpreted the cardiac monitored which showed an underlying rhythm of: N/A   Medicines ordered and prescription drug management:  I ordered medication including Toradol for pain I have reviewed the patients home medicines and have made adjustments as needed  Critical Interventions:  N/A   Reevaluation:  Presents with left-sided facial pain, benign physical exam, agreement with plan discharge.  Consultations Obtained:  N/A   Test Considered:  CT maxillofacial-deferred as my suspicion for actual abscess is very low at this time.  More consistent with likely TMJ.    Rule out I  have low suspicion for peritonsillar abscess, retropharyngeal abscess, or Ludwig angina as oropharynx was visualized tongue and uvula were both midline, there is no exudates, erythema or edema noted in the posterior pillars or on/ around tonsils.  Low suspicion for an abscess as gumline were palpated no fluctuance or induration felt.  Low suspicion for periorbital or orbital cellulitis as patient face had no erythematous, patient EOMs were intact, he had no pain with eye movement.  Low suspicion for mastoiditis and/or otitis media no evidence of infection my exam no ear protrusion.      Dispostion and problem list  After consideration of the diagnostic results and the patients response to treatment, I feel that the patent would benefit from discharge.  TMJ- will start patient on anti-inflammatories, follow-up with dentist for further evaluation and strict return  precautions.            Final Clinical Impression(s) / ED Diagnoses Final diagnoses:  Arthritis of left temporomandibular joint    Rx / DC Orders ED Discharge Orders          Ordered    ibuprofen (ADVIL) 600 MG tablet  3 times daily        12/29/21 0143              Carroll Sage, PA-C 12/29/21 0144    Sloan Leiter, DO 01/02/22 [phone]

## 2022-03-02 ENCOUNTER — Observation Stay (HOSPITAL_COMMUNITY)
Admission: EM | Admit: 2022-03-02 | Discharge: 2022-03-02 | Disposition: A | Discharge status: Home / Self Care | Payer: Managed Care, Other (non HMO) | Attending: General Surgery | Admitting: General Surgery

## 2022-03-02 ENCOUNTER — Other Ambulatory Visit: Payer: Self-pay

## 2022-03-02 ENCOUNTER — Emergency Department (HOSPITAL_COMMUNITY): Payer: Managed Care, Other (non HMO)

## 2022-03-02 ENCOUNTER — Encounter (HOSPITAL_COMMUNITY)
Admission: EM | Disposition: A | Discharge status: Home / Self Care | Payer: Self-pay | Source: Home / Self Care | Attending: Emergency Medicine

## 2022-03-02 ENCOUNTER — Emergency Department (HOSPITAL_COMMUNITY): Payer: Managed Care, Other (non HMO) | Admitting: Certified Registered Nurse Anesthetist

## 2022-03-02 ENCOUNTER — Encounter (HOSPITAL_COMMUNITY): Payer: Self-pay

## 2022-03-02 DIAGNOSIS — R1030 Lower abdominal pain, unspecified: Secondary | ICD-10-CM | POA: Diagnosis present

## 2022-03-02 DIAGNOSIS — F17210 Nicotine dependence, cigarettes, uncomplicated: Secondary | ICD-10-CM | POA: Insufficient documentation

## 2022-03-02 DIAGNOSIS — Z8719 Personal history of other diseases of the digestive system: Secondary | ICD-10-CM

## 2022-03-02 DIAGNOSIS — K4030 Unilateral inguinal hernia, with obstruction, without gangrene, not specified as recurrent: Principal | ICD-10-CM | POA: Insufficient documentation

## 2022-03-02 HISTORY — PX: INGUINAL HERNIA REPAIR: SHX194

## 2022-03-02 LAB — CBC
HCT: 42.2 % (ref 39.0–52.0)
Hemoglobin: 14.5 g/dL (ref 13.0–17.0)
MCH: 32.7 pg (ref 26.0–34.0)
MCHC: 34.4 g/dL (ref 30.0–36.0)
MCV: 95.3 fL (ref 80.0–100.0)
Platelets: 245 10*3/uL (ref 150–400)
RBC: 4.43 MIL/uL (ref 4.22–5.81)
RDW: 12.1 % (ref 11.5–15.5)
WBC: 12.8 10*3/uL — ABNORMAL HIGH (ref 4.0–10.5)
nRBC: 0 % (ref 0.0–0.2)

## 2022-03-02 LAB — URINALYSIS, ROUTINE W REFLEX MICROSCOPIC
Bilirubin Urine: NEGATIVE
Glucose, UA: NEGATIVE mg/dL
Hgb urine dipstick: NEGATIVE
Ketones, ur: NEGATIVE mg/dL
Leukocytes,Ua: NEGATIVE
Nitrite: NEGATIVE
Protein, ur: NEGATIVE mg/dL
Specific Gravity, Urine: 1.015 (ref 1.005–1.030)
pH: 5 (ref 5.0–8.0)

## 2022-03-02 LAB — COMPREHENSIVE METABOLIC PANEL
ALT: 16 U/L (ref 0–44)
AST: 20 U/L (ref 15–41)
Albumin: 4.9 g/dL (ref 3.5–5.0)
Alkaline Phosphatase: 57 U/L (ref 38–126)
Anion gap: 8 (ref 5–15)
BUN: 16 mg/dL (ref 6–20)
CO2: 26 mmol/L (ref 22–32)
Calcium: 9.4 mg/dL (ref 8.9–10.3)
Chloride: 104 mmol/L (ref 98–111)
Creatinine, Ser: 0.66 mg/dL (ref 0.61–1.24)
GFR, Estimated: >60 mL/min (ref >60)
Glucose, Bld: 95 mg/dL (ref 70–99)
Potassium: 3.6 mmol/L (ref 3.5–5.1)
Sodium: 138 mmol/L (ref 135–145)
Total Bilirubin: 0.6 mg/dL (ref 0.3–1.2)
Total Protein: 8.1 g/dL (ref 6.5–8.1)

## 2022-03-02 LAB — LACTIC ACID, PLASMA: Lactic Acid, Venous: 0.7 mmol/L (ref 0.5–1.9)

## 2022-03-02 LAB — LIPASE, BLOOD: Lipase: 39 U/L (ref 11–51)

## 2022-03-02 SURGERY — REPAIR, HERNIA, INGUINAL, INCARCERATED
Anesthesia: General | Site: Abdomen | Laterality: Right

## 2022-03-02 MED ORDER — PROPOFOL 10 MG/ML IV BOLUS
INTRAVENOUS | Status: AC
  Filled 2022-03-02: qty 20

## 2022-03-02 MED ORDER — ONDANSETRON HCL 4 MG/2ML IJ SOLN
INTRAMUSCULAR | Status: AC
  Filled 2022-03-02: qty 2

## 2022-03-02 MED ORDER — FENTANYL CITRATE (PF) 100 MCG/2ML IJ SOLN
INTRAMUSCULAR | Status: AC
  Filled 2022-03-02: qty 2

## 2022-03-02 MED ORDER — BUPIVACAINE HCL (PF) 0.5 % IJ SOLN
INTRAMUSCULAR | Status: DC | PRN
  Administered 2022-03-02: 15 mL

## 2022-03-02 MED ORDER — 0.9 % SODIUM CHLORIDE (POUR BTL) OPTIME
TOPICAL | Status: DC | PRN
  Administered 2022-03-02: 1000 mL

## 2022-03-02 MED ORDER — ACETAMINOPHEN 500 MG PO TABS
ORAL_TABLET | ORAL | Status: AC
  Filled 2022-03-02: qty 2

## 2022-03-02 MED ORDER — FENTANYL CITRATE PF 50 MCG/ML IJ SOSY
25.0000 ug | PREFILLED_SYRINGE | INTRAMUSCULAR | Status: DC | PRN
  Administered 2022-03-02 (×2): 50 ug via INTRAVENOUS

## 2022-03-02 MED ORDER — BISACODYL 5 MG PO TBEC: 5.0000 mg | DELAYED_RELEASE_TABLET | Freq: Every day | ORAL | Status: DC | PRN

## 2022-03-02 MED ORDER — OXYCODONE HCL 5 MG PO TABS: 5.0000 mg | ORAL_TABLET | Freq: Once | ORAL | Status: DC | PRN

## 2022-03-02 MED ORDER — EPHEDRINE SULFATE-NACL 50-0.9 MG/10ML-% IV SOSY
PREFILLED_SYRINGE | INTRAVENOUS | Status: DC | PRN
  Administered 2022-03-02 (×2): 5 mg via INTRAVENOUS
  Administered 2022-03-02: 7.5 mg via INTRAVENOUS

## 2022-03-02 MED ORDER — PHENYLEPHRINE 80 MCG/ML (10ML) SYRINGE FOR IV PUSH (FOR BLOOD PRESSURE SUPPORT)
PREFILLED_SYRINGE | INTRAVENOUS | Status: DC | PRN
  Administered 2022-03-02: 80 ug via INTRAVENOUS

## 2022-03-02 MED ORDER — PROPOFOL 10 MG/ML IV BOLUS
INTRAVENOUS | Status: DC | PRN
  Administered 2022-03-02: 200 mg via INTRAVENOUS

## 2022-03-02 MED ORDER — BUPIVACAINE LIPOSOME 1.3 % IJ SUSP
INTRAMUSCULAR | Status: DC | PRN
  Administered 2022-03-02: 10 mL

## 2022-03-02 MED ORDER — PROMETHAZINE HCL 25 MG/ML IJ SOLN: 6.2500 mg | INTRAMUSCULAR | Status: DC | PRN

## 2022-03-02 MED ORDER — METHOCARBAMOL 500 MG PO TABS: 500.0000 mg | ORAL_TABLET | Freq: Three times a day (TID) | ORAL | Status: DC | PRN

## 2022-03-02 MED ORDER — OXYCODONE HCL 5 MG/5ML PO SOLN: 5.0000 mg | Freq: Once | ORAL | Status: DC | PRN

## 2022-03-02 MED ORDER — DEXAMETHASONE SODIUM PHOSPHATE 10 MG/ML IJ SOLN
INTRAMUSCULAR | Status: AC
  Filled 2022-03-02: qty 1

## 2022-03-02 MED ORDER — KETOROLAC TROMETHAMINE 15 MG/ML IJ SOLN: 15.0000 mg | Freq: Four times a day (QID) | INTRAMUSCULAR | Status: DC | PRN

## 2022-03-02 MED ORDER — CELECOXIB 200 MG PO CAPS
400.0000 mg | ORAL_CAPSULE | ORAL | Status: AC
  Administered 2022-03-02: 400 mg via ORAL

## 2022-03-02 MED ORDER — OXYCODONE HCL 5 MG PO TABS
5.0000 mg | ORAL_TABLET | ORAL | Status: DC | PRN
  Administered 2022-03-02: 5 mg via ORAL
  Filled 2022-03-02: qty 1

## 2022-03-02 MED ORDER — IOHEXOL 300 MG/ML  SOLN
100.0000 mL | Freq: Once | INTRAMUSCULAR | Status: AC | PRN
  Administered 2022-03-02: 100 mL via INTRAVENOUS

## 2022-03-02 MED ORDER — LIDOCAINE 2% (20 MG/ML) 5 ML SYRINGE
INTRAMUSCULAR | Status: DC | PRN
  Administered 2022-03-02: 60 mg via INTRAVENOUS

## 2022-03-02 MED ORDER — FENTANYL CITRATE PF 50 MCG/ML IJ SOSY
PREFILLED_SYRINGE | INTRAMUSCULAR | Status: AC
  Administered 2022-03-02: 100 ug
  Filled 2022-03-02: qty 2

## 2022-03-02 MED ORDER — ONDANSETRON HCL 4 MG/2ML IJ SOLN: 4.0000 mg | Freq: Four times a day (QID) | INTRAMUSCULAR | Status: DC | PRN

## 2022-03-02 MED ORDER — BUPIVACAINE HCL 0.25 % IJ SOLN
INTRAMUSCULAR | Status: AC
  Filled 2022-03-02: qty 1

## 2022-03-02 MED ORDER — MIDAZOLAM HCL 2 MG/2ML IJ SOLN
INTRAMUSCULAR | Status: AC
  Administered 2022-03-02: 2 mg
  Filled 2022-03-02: qty 4

## 2022-03-02 MED ORDER — ONDANSETRON HCL 4 MG/2ML IJ SOLN
INTRAMUSCULAR | Status: DC | PRN
  Administered 2022-03-02: 4 mg via INTRAVENOUS

## 2022-03-02 MED ORDER — SODIUM CHLORIDE 0.9 % IV SOLN: INTRAVENOUS | Status: DC

## 2022-03-02 MED ORDER — ENOXAPARIN SODIUM 40 MG/0.4ML IJ SOSY: 40.0000 mg | PREFILLED_SYRINGE | INTRAMUSCULAR | Status: DC

## 2022-03-02 MED ORDER — MORPHINE SULFATE (PF) 2 MG/ML IV SOLN: 2.0000 mg | INTRAVENOUS | Status: DC | PRN

## 2022-03-02 MED ORDER — LACTATED RINGERS IV SOLN: INTRAVENOUS | Status: DC | PRN

## 2022-03-02 MED ORDER — CEFAZOLIN SODIUM-DEXTROSE 2-3 GM-%(50ML) IV SOLR
INTRAVENOUS | Status: DC | PRN
  Administered 2022-03-02: 2 g via INTRAVENOUS

## 2022-03-02 MED ORDER — CELECOXIB 200 MG PO CAPS
ORAL_CAPSULE | ORAL | Status: AC
  Filled 2022-03-02: qty 2

## 2022-03-02 MED ORDER — ACETAMINOPHEN 500 MG PO TABS
1000.0000 mg | ORAL_TABLET | Freq: Four times a day (QID) | ORAL | Status: DC
  Administered 2022-03-02: 1000 mg via ORAL
  Filled 2022-03-02: qty 2

## 2022-03-02 MED ORDER — KETOROLAC TROMETHAMINE 30 MG/ML IJ SOLN: 30.0000 mg | Freq: Once | INTRAMUSCULAR | Status: DC

## 2022-03-02 MED ORDER — DEXAMETHASONE SODIUM PHOSPHATE 4 MG/ML IJ SOLN
INTRAMUSCULAR | Status: DC | PRN
  Administered 2022-03-02: 10 mg via INTRAVENOUS

## 2022-03-02 MED ORDER — SIMETHICONE 80 MG PO CHEW: 40.0000 mg | CHEWABLE_TABLET | Freq: Four times a day (QID) | ORAL | Status: DC | PRN

## 2022-03-02 MED ORDER — ONDANSETRON HCL 4 MG/2ML IJ SOLN
4.0000 mg | Freq: Once | INTRAMUSCULAR | Status: AC
  Administered 2022-03-02: 4 mg via INTRAVENOUS
  Filled 2022-03-02: qty 2

## 2022-03-02 MED ORDER — FENTANYL CITRATE PF 50 MCG/ML IJ SOSY
50.0000 ug | PREFILLED_SYRINGE | Freq: Once | INTRAMUSCULAR | Status: AC
  Administered 2022-03-02: 50 ug via INTRAVENOUS
  Filled 2022-03-02: qty 1

## 2022-03-02 MED ORDER — ACETAMINOPHEN 500 MG PO TABS
1000.0000 mg | ORAL_TABLET | ORAL | Status: AC
Start: 2022-03-02 — End: 2022-03-02
  Administered 2022-03-02: 1000 mg via ORAL

## 2022-03-02 MED ORDER — ONDANSETRON 4 MG PO TBDP: 4.0000 mg | ORAL_TABLET | Freq: Four times a day (QID) | ORAL | Status: DC | PRN

## 2022-03-02 MED ORDER — SODIUM CHLORIDE 0.9 % IV BOLUS
500.0000 mL | Freq: Once | INTRAVENOUS | Status: AC
  Administered 2022-03-02: 500 mL via INTRAVENOUS

## 2022-03-02 MED ORDER — OXYCODONE HCL 5 MG PO TABS: 5.0000 mg | ORAL_TABLET | ORAL | 0 refills | Status: DC | PRN

## 2022-03-02 MED ORDER — BUPIVACAINE HCL (PF) 0.25 % IJ SOLN
INTRAMUSCULAR | Status: DC | PRN
  Administered 2022-03-02: 10 mL

## 2022-03-02 MED ORDER — CEFAZOLIN SODIUM 1 G IJ SOLR
INTRAMUSCULAR | Status: AC
  Filled 2022-03-02: qty 20

## 2022-03-02 MED ORDER — FENTANYL CITRATE PF 50 MCG/ML IJ SOSY
PREFILLED_SYRINGE | INTRAMUSCULAR | Status: AC
  Filled 2022-03-02: qty 2

## 2022-03-02 MED ORDER — FENTANYL CITRATE (PF) 100 MCG/2ML IJ SOLN
INTRAMUSCULAR | Status: DC | PRN
  Administered 2022-03-02 (×2): 50 ug via INTRAVENOUS

## 2022-03-02 MED ORDER — METHOCARBAMOL 1000 MG/10ML IJ SOLN: 500.0000 mg | Freq: Three times a day (TID) | INTRAVENOUS | Status: DC | PRN

## 2022-03-02 SURGICAL SUPPLY — 33 items
APL PRP STRL LF DISP 70% ISPRP (MISCELLANEOUS) ×1
BLADE HEX COATED 2.75 (ELECTRODE) IMPLANT
CHLORAPREP W/TINT 26 (MISCELLANEOUS) IMPLANT
CLSR STERI-STRIP ANTIMIC 1/2X4 (GAUZE/BANDAGES/DRESSINGS) IMPLANT
COVER SURGICAL LIGHT HANDLE (MISCELLANEOUS) IMPLANT
DRAIN PENROSE 0.5X18 (DRAIN) IMPLANT
DRAPE LAPAROSCOPIC ABDOMINAL (DRAPES) IMPLANT
ELECT PENCIL ROCKER SW 15FT (MISCELLANEOUS) IMPLANT
GAUZE 4X4 16PLY ~~LOC~~+RFID DBL (SPONGE) IMPLANT
GLOVE BIO SURGEON STRL SZ7 (GLOVE) IMPLANT
GLOVE INDICATOR 7.5 STRL GRN (GLOVE) IMPLANT
GOWN STRL REUS W/ TWL LRG LVL3 (GOWN DISPOSABLE) IMPLANT
GOWN STRL REUS W/TWL LRG LVL3 (GOWN DISPOSABLE) ×3
KIT BASIN OR (CUSTOM PROCEDURE TRAY) IMPLANT
MARKER SKIN DUAL TIP RULER LAB (MISCELLANEOUS) IMPLANT
MESH ULTRAPRO 6X6 15CM15CM (Mesh General) IMPLANT
NDL HYPO 25X1 1.5 SAFETY (NEEDLE) IMPLANT
NEEDLE HYPO 25X1 1.5 SAFETY (NEEDLE) ×1 IMPLANT
PACK GENERAL/GYN (CUSTOM PROCEDURE TRAY) IMPLANT
SUT MNCRL AB 4-0 PS2 18 (SUTURE) IMPLANT
SUT PROLENE 2 0 SH DA (SUTURE) IMPLANT
SUT SILK 2 0 SH (SUTURE) IMPLANT
SUT VIC AB 2-0 SH 18 (SUTURE) IMPLANT
SUT VIC AB 2-0 SH 27 (SUTURE) ×1
SUT VIC AB 2-0 SH 27X BRD (SUTURE) IMPLANT
SUT VIC AB 3-0 54XBRD REEL (SUTURE) IMPLANT
SUT VIC AB 3-0 BRD 54 (SUTURE) ×1
SUT VIC AB 3-0 SH 18 (SUTURE) IMPLANT
SUT VIC AB 3-0 SH 27 (SUTURE) ×1
SUT VIC AB 3-0 SH 27XBRD (SUTURE) IMPLANT
SYR CONTROL 10ML LL (SYRINGE) IMPLANT
TOWEL OR 17X26 10 PK STRL BLUE (TOWEL DISPOSABLE) IMPLANT
TOWEL OR NON WOVEN STRL DISP B (DISPOSABLE) IMPLANT

## 2022-03-02 NOTE — Progress Notes (Signed)
Patient discharged per MD order. IV removed as indicated. Patient had voided 400 ml prior to discharge. He insisted on waiting at the ER entrance for his mother to arrive to pick him up.Patient up walking around room, able to walk independently to bathroom. Gait steady. Personal belongings with patient at time of discharge. Discharge instructions given verbal and written. All questions answered. Patient in stable condition at time of discharge.

## 2022-03-02 NOTE — Anesthesia Procedure Notes (Signed)
Procedure Name: LMA Insertion Date/Time: 03/02/2022 10:10 AM  Performed by: Claudia Desanctis, CRNAPre-anesthesia Checklist: Emergency Drugs available, Patient identified, Suction available and Patient being monitored Patient Re-evaluated:Patient Re-evaluated prior to induction Oxygen Delivery Method: Circle system utilized Preoxygenation: Pre-oxygenation with 100% oxygen Induction Type: IV induction Ventilation: Mask ventilation without difficulty LMA: LMA inserted and LMA with gastric port inserted LMA Size: 4.0 Number of attempts: 1 Placement Confirmation: positive ETCO2 and breath sounds checked- equal and bilateral Tube secured with: Tape Dental Injury: Teeth and Oropharynx as per pre-operative assessment

## 2022-03-02 NOTE — Op Note (Signed)
Preoperative diagnosis: Incarcerated right inguinal hernia Postoperative diagnosis: Same as above Procedure: Right indirect inguinal hernia repair with ultra Pro mesh Surgeon: Dr. Serita Grammes Anesthesia: General with a tap block Estimated blood loss: Minimal Specimens: None Drains: None Complications: None Sponge and needle count was correct completion Disposition to recovery stable condition  Indications: This is a healthy 31 year old male who has had some lower abdominal pain for about a week and noted a right groin bulge.  He has had an episode of emesis although he still having bowel function.  This was getting worse and nothing he was doing at home was helping so he presented the emergency room.  On CT scan that he had a right inguinal scrotal hernia that had fat incarcerated.  There was no bowel.  I examined him and he was mildly tender over the site so we discussed going to the operating room for repair.  Procedure: After informed consent was obtained he first underwent a tap block with Dr. Fransisco Beau.  SCDs were in place.  Antibiotics were given.  He was placed under general anesthesia with an LMA.  He was prepped and draped in the standard sterile surgical fashion.  A surgical timeout was then performed.  I did infiltrated some Marcaine at the skin line incision in the right groin.  I then made a right groin incision.  I carried this out down to the external oblique.  I divided the superficial epigastric vein with cautery as it was diminutive.  I then opened the oblique with Metzenbaum scissors through the external ring.  I then encircled the spermatic cord with a Penrose drain.  He had no direct hernia.  He had a fairly large indirect hernia sac with some fat present in it.  I was able to dissect this free.  I did enter into the sac.  The fat then reduced.  This was all viable.  I removed a portion of the distal sac that was very adherent to the cord.  The cord structures were preserved.  I  then tied off the sac at the proximal portion with a 3-0 Vicryl suture and cut off the rest of it.  I placed this in the abdomen.  I closed the internal ring with a 3-0 Vicryl suture.  I then placed an ultra pro mesh patch across the entire groin.  I sutured this into position 2 positions to the pubic tubercle.  I sutured this to the external oblique every half centimeter with 3-0 Vicryl suture.  I made a T cut and wrapped around the mesh.  I secured this superiorly to the internal oblique.  I laid the lateral portion flat under the external oblique.  This was in good position and completely obliterated the defect.  Hemostasis was observed.  I then closed the external oblique with 3-0 Vicryl suture.  Scarpa's was closed with Vicryl suture.  I then closed the skin with 4-0 Monocryl and glue.  He tolerated this well was extubated transferred recovery stable.

## 2022-03-02 NOTE — ED Triage Notes (Signed)
Patient arrived with complaints of lower abdominal pain since last Sunday, started feeling lightheaded today like he was going to pass out. Last dose of OTC medication Friday morning.

## 2022-03-02 NOTE — ED Notes (Signed)
CHG bath complete

## 2022-03-02 NOTE — Anesthesia Postprocedure Evaluation (Signed)
Anesthesia Post Note  [name]  Procedure(s) Performed: HERNIA REPAIR INGUINAL INCARCERATED (Right: Abdomen)     Patient location during evaluation: PACU Anesthesia Type: General Level of consciousness: awake and alert Pain management: pain level controlled Vital Signs Assessment: post-procedure vital signs reviewed and stable Respiratory status: spontaneous breathing, nonlabored ventilation and respiratory function stable Cardiovascular status: stable and blood pressure returned to baseline Anesthetic complications: no   No notable events documented.  Last Vitals:  Vitals:   03/02/22 1136 03/02/22 1158  BP:  106/70  Pulse: 68 (!) 50  Resp:  18  Temp: 36.4 C 36.8 C  SpO2: 100% 98%                   Audry Pili

## 2022-03-02 NOTE — ED Provider Notes (Signed)
[facility]-EMERGENCY DEPT Provider Note   CSN: [id number] Arrival date & time: 03/02/22  0036     History  Chief Complaint  Patient presents with   Abdominal Pain   Dizziness    Aaron Fry is a 31 y.o. male.  The history is provided by the patient.  Abdominal Pain Pain location:  LLQ and RLQ Pain quality: sharp   Pain radiates to:  Does not radiate Pain severity:  Severe Onset quality:  Gradual Timing:  Constant Progression:  Waxing and waning Chronicity:  New Context: not retching and not trauma   Relieved by:  Nothing Worsened by:  Nothing Ineffective treatments:  NSAIDs Associated symptoms: no constipation, no dysuria and no fever   Risk factors: has not had multiple surgeries   Patient presents with several days of B lower abdominal pain.  No fevers.  No vomiting or diarrhea. No urinary complaints.      History reviewed. No pertinent past medical history.  Home Medications Prior to Admission medications   Medication Sig Start Date End Date Taking? Authorizing Provider  cyclobenzaprine (FLEXERIL) 10 MG tablet Take 1 tablet (10 mg total) by mouth 2 (two) times daily as needed for muscle spasms. Patient not taking: Reported on 03/02/2022 10/08/21   Carroll Sage, PA-C  hydrOXYzine (ATARAX) 25 MG tablet Take 1 tablet (25 mg total) by mouth every 8 (eight) hours as needed for anxiety. Patient not taking: Reported on 03/02/2022 09/29/21   Benjiman Core, MD  methocarbamol (ROBAXIN) 500 MG tablet Take 1 tablet (500 mg total) by mouth 2 (two) times daily. Patient not taking: Reported on 03/02/2022 12/03/21   Marita Kansas, PA-C  naproxen (NAPROSYN) 375 MG tablet Take 1 tablet (375 mg total) by mouth 2 (two) times daily. Patient not taking: Reported on 03/02/2022 12/03/21   Marita Kansas, PA-C  famotidine (PEPCID) 20 MG tablet Take 1 tablet (20 mg total) by mouth 2 (two) times daily. 05/30/19 03/12/20  Gilda Crease, MD      Allergies     Patient has no known allergies.    Review of Systems   Review of Systems  Constitutional:  Negative for fever.  HENT:  Negative for facial swelling.   Eyes:  Negative for redness.  Respiratory:  Negative for wheezing and stridor.   Gastrointestinal:  Positive for abdominal pain. Negative for constipation.  Genitourinary:  Negative for dysuria.    Physical Exam Updated Vital Signs BP 98/72 (BP Location: Left Arm)   Pulse (!) 57   Temp 97.7 F (36.5 C) (Oral)   Resp 14   Ht 5\' 9"  (1.753 m)   Wt 67.6 kg   SpO2 97%   BMI 22.00 kg/m  Physical Exam Vitals and nursing note reviewed.  Constitutional:      General: He is not in acute distress.    Appearance: He is well-developed. He is not diaphoretic.  HENT:     Head: Normocephalic and atraumatic.     Nose: Nose normal.  Eyes:     Conjunctiva/sclera: Conjunctivae normal.     Pupils: Pupils are equal, round, and reactive to light.  Cardiovascular:     Rate and Rhythm: Normal rate and regular rhythm.     Pulses: Normal pulses.     Heart sounds: Normal heart sounds.  Pulmonary:     Effort: Pulmonary effort is normal.     Breath sounds: Normal breath sounds. No wheezing or rales.  Abdominal:     General: Bowel sounds  are decreased.     Palpations: Abdomen is soft.     Tenderness: There is no abdominal tenderness. There is no guarding or rebound. Negative signs include Murphy's sign and McBurney's sign.  Musculoskeletal:        General: Normal range of motion.     Cervical back: Normal range of motion and neck supple.  Skin:    General: Skin is warm and dry.  Neurological:     Mental Status: He is alert and oriented to person, place, and time.     ED Results / Procedures / Treatments   Labs (all labs ordered are listed, but only abnormal results are displayed) Results for orders placed or performed during the hospital encounter of 03/02/22  Lipase, blood  Result Value Ref Range   Lipase 39 11 - 51 U/L   Comprehensive metabolic panel  Result Value Ref Range   Sodium 138 135 - 145 mmol/L   Potassium 3.6 3.5 - 5.1 mmol/L   Chloride 104 98 - 111 mmol/L   CO2 26 22 - 32 mmol/L   Glucose, Bld 95 70 - 99 mg/dL   BUN 16 6 - 20 mg/dL   Creatinine, Ser 0.66 0.61 - 1.24 mg/dL   Calcium 9.4 8.9 - 10.3 mg/dL   Total Protein 8.1 6.5 - 8.1 g/dL   Albumin 4.9 3.5 - 5.0 g/dL   AST 20 15 - 41 U/L   ALT 16 0 - 44 U/L   Alkaline Phosphatase 57 38 - 126 U/L   Total Bilirubin 0.6 0.3 - 1.2 mg/dL   GFR, Estimated >60 >60 mL/min   Anion gap 8 5 - 15  CBC  Result Value Ref Range   WBC 12.8 (H) 4.0 - 10.5 K/uL   RBC 4.43 4.22 - 5.81 MIL/uL   Hemoglobin 14.5 13.0 - 17.0 g/dL   HCT 42.2 39.0 - 52.0 %   MCV 95.3 80.0 - 100.0 fL   MCH 32.7 26.0 - 34.0 pg   MCHC 34.4 30.0 - 36.0 g/dL   RDW 12.1 11.5 - 15.5 %   Platelets 245 150 - 400 K/uL   nRBC 0.0 0.0 - 0.2 %  Urinalysis, Routine w reflex microscopic  Result Value Ref Range   Color, Urine YELLOW YELLOW   APPearance CLEAR CLEAR   Specific Gravity, Urine 1.015 1.005 - 1.030   pH 5.0 5.0 - 8.0   Glucose, UA NEGATIVE NEGATIVE mg/dL   Hgb urine dipstick NEGATIVE NEGATIVE   Bilirubin Urine NEGATIVE NEGATIVE   Ketones, ur NEGATIVE NEGATIVE mg/dL   Protein, ur NEGATIVE NEGATIVE mg/dL   Nitrite NEGATIVE NEGATIVE   Leukocytes,Ua NEGATIVE NEGATIVE   CT ABDOMEN PELVIS W CONTRAST  Result Date: 03/02/2022 CLINICAL DATA:  31 year old male with history of flank pain. Suspected kidney stone. EXAM: CT ABDOMEN AND PELVIS WITH CONTRAST TECHNIQUE: Multidetector CT imaging of the abdomen and pelvis was performed using the standard protocol following bolus administration of intravenous contrast. RADIATION DOSE REDUCTION: This exam was performed according to the departmental dose-optimization program which includes automated exposure control, adjustment of the mA and/or kV according to patient size and/or use of iterative reconstruction technique. CONTRAST:  149mL  OMNIPAQUE IOHEXOL 300 MG/ML  SOLN COMPARISON:  No priors. FINDINGS: Lower chest: Unremarkable. Hepatobiliary: Mild diffuse low attenuation throughout the hepatic parenchyma, suggesting a background of mild hepatic steatosis (difficult to confirm on today's contrast enhanced study). No suspicious cystic or solid hepatic lesions. No intra or extrahepatic biliary ductal dilatation. Gallbladder is unremarkable in  appearance. Pancreas: No pancreatic mass. No pancreatic ductal dilatation. No pancreatic or peripancreatic fluid collections or inflammatory changes. Spleen: Unremarkable. Adrenals/Urinary Tract: Multiple subcentimeter low-attenuation lesions in both kidneys, too small to definitively characterize, but statistically likely to represent tiny cysts (no imaging follow-up is recommended). 4 mm nonobstructive calculus in the lower pole collecting system of the left kidney. No aggressive appearing renal lesions. No hydroureteronephrosis. Urinary bladder is normal in appearance. Left adrenal gland is normal in appearance. Right adrenal gland is densely calcified, presumably sequela of remote right adrenal infection and/or hemorrhage. Stomach/Bowel: The appearance of the stomach is normal. No pathologic dilatation of small bowel or colon. The appendix is not confidently identified and may be surgically absent. Regardless, there are no inflammatory changes noted adjacent to the cecum to suggest the presence of an acute appendicitis at this time. Vascular/Lymphatic: No significant atherosclerotic disease, aneurysm or dissection noted in the abdominal or pelvic vasculature. No lymphadenopathy noted in the abdomen or pelvis. Reproductive: Prostate gland and seminal vesicles are unremarkable in appearance. Right-sided inguinal scrotal hernia containing only fat. This extends all the way to the level of the right testicle, and there is very mild fat stranding just proximal to the ostium of the hernia. Other: No significant  volume of ascites.  No pneumoperitoneum. Musculoskeletal: There are no aggressive appearing lytic or blastic lesions noted in the visualized portions of the skeleton. IMPRESSION: 1. Large right inguinal scrotal hernia. No associated bowel incarceration or obstruction at this time. However, there is very mild fat stranding just proximal to the mouth of the hernia, which could suggest developing ischemia. Surgical consultation is recommended. 2. Probable mild hepatic steatosis. 3. 4 mm nonobstructive calculus in the lower pole collecting system of the left kidney. 4. Additional incidental findings, as above. Electronically Signed   By: Trudie Reed M.D.   On: 03/02/2022 05:41    '  Radiology CT ABDOMEN PELVIS W CONTRAST  Result Date: 03/02/2022 CLINICAL DATA:  31 year old male with history of flank pain. Suspected kidney stone. EXAM: CT ABDOMEN AND PELVIS WITH CONTRAST TECHNIQUE: Multidetector CT imaging of the abdomen and pelvis was performed using the standard protocol following bolus administration of intravenous contrast. RADIATION DOSE REDUCTION: This exam was performed according to the departmental dose-optimization program which includes automated exposure control, adjustment of the mA and/or kV according to patient size and/or use of iterative reconstruction technique. CONTRAST:  OMNIPAQUE IOHEXOL 300 MG/ML  SOLN COMPARISON:  No priors. FINDINGS: Lower chest: Unremarkable. Hepatobiliary: Mild diffuse low attenuation throughout the hepatic parenchyma, suggesting a background of mild hepatic steatosis (difficult to confirm on today's contrast enhanced study). No suspicious cystic or solid hepatic lesions. No intra or extrahepatic biliary ductal dilatation. Gallbladder is unremarkable in appearance. Pancreas: No pancreatic mass. No pancreatic ductal dilatation. No pancreatic or peripancreatic fluid collections or inflammatory changes. Spleen: Unremarkable. Adrenals/Urinary Tract: Multiple  subcentimeter low-attenuation lesions in both kidneys, too small to definitively characterize, but statistically likely to represent tiny cysts (no imaging follow-up is recommended). 4 mm nonobstructive calculus in the lower pole collecting system of the left kidney. No aggressive appearing renal lesions. No hydroureteronephrosis. Urinary bladder is normal in appearance. Left adrenal gland is normal in appearance. Right adrenal gland is densely calcified, presumably sequela of remote right adrenal infection and/or hemorrhage. Stomach/Bowel: The appearance of the stomach is normal. No pathologic dilatation of small bowel or colon. The appendix is not confidently identified and may be surgically absent. Regardless, there are no inflammatory changes noted adjacent to  the cecum to suggest the presence of an acute appendicitis at this time. Vascular/Lymphatic: No significant atherosclerotic disease, aneurysm or dissection noted in the abdominal or pelvic vasculature. No lymphadenopathy noted in the abdomen or pelvis. Reproductive: Prostate gland and seminal vesicles are unremarkable in appearance. Right-sided inguinal scrotal hernia containing only fat. This extends all the way to the level of the right testicle, and there is very mild fat stranding just proximal to the ostium of the hernia. Other: No significant volume of ascites.  No pneumoperitoneum. Musculoskeletal: There are no aggressive appearing lytic or blastic lesions noted in the visualized portions of the skeleton. IMPRESSION: 1. Large right inguinal scrotal hernia. No associated bowel incarceration or obstruction at this time. However, there is very mild fat stranding just proximal to the mouth of the hernia, which could suggest developing ischemia. Surgical consultation is recommended. 2. Probable mild hepatic steatosis. 3. 4 mm nonobstructive calculus in the lower pole collecting system of the left kidney. 4. Additional incidental findings, as above.  Electronically Signed   By: Trudie Reed M.D.   On: 03/02/2022 05:41    Procedures Procedures    Medications Ordered in ED Medications  ondansetron (ZOFRAN) injection 4 mg (has no administration in time range)  sodium chloride 0.9 % bolus 500 mL (has no administration in time range)  fentaNYL (SUBLIMAZE) injection 50 mcg (has no administration in time range)  iohexol (OMNIPAQUE) 300 MG/ML solution 100 mL (100 mLs Intravenous Contrast Given 03/02/22 0439)    ED Course/ Medical Decision Making/ A&P                           Medical Decision Making Patient with lower abdominal pain for several days   Amount and/or Complexity of Data Reviewed External Data Reviewed: notes.    Details: Previous ED notes reviewed  Labs: ordered.    Details: All labs reviewed:  lipase normal 39, normal sodium 138, normal potassium 3.6, normal creatinine .66.  Normal LFTs. Urine is negative for UTI Radiology: ordered and independent interpretation performed.    Details: Scrotal hernia on CT Discussion of management or test interpretation with external provider(s): Case d/w Dr. Dwain Sarna, to be seen by surgery   Risk Prescription drug management. Decision regarding hospitalization.    Final Clinical Impression(s) / ED Diagnoses Final diagnoses:  Non-recurrent unilateral inguinal hernia with obstruction without gangrene   Awaiting surgery evaluation.   Rx / DC Orders ED Discharge Orders     None         Stesha Neyens, MD 03/02/22 [id number]

## 2022-03-02 NOTE — Transfer of Care (Signed)
Immediate Anesthesia Transfer of Care Note  [name]  Procedure(s) Performed: HERNIA REPAIR INGUINAL INCARCERATED (Right: Abdomen)  Patient Location: PACU  Anesthesia Type:General  Level of Consciousness: drowsy  Airway & Oxygen Therapy: Patient Spontanous Breathing and Patient connected to face mask  Post-op Assessment: Report given to RN and Post -op Vital signs reviewed and stable  Post vital signs: Reviewed and stable  Last Vitals:  Vitals Value Taken Time  BP 114/67 03/02/22 1103  Temp    Pulse 60 03/02/22 1104  Resp 14 03/02/22 1104  SpO2 100 % 03/02/22 1104  Vitals shown include unvalidated device data.  Last Pain:  Vitals:   03/02/22 0651  TempSrc:   PainSc: 2          Complications: No notable events documented.

## 2022-03-02 NOTE — ED Notes (Signed)
Pt reports lower abd pain but denies nausea vomiting diarrhea .

## 2022-03-02 NOTE — H&P (Signed)
Aaron Fry is an 31 y.o. male.   Chief Complaint: lower ab pain HPI: 36 yom who is otherwise healthy for most part presents with lower abdominal pain for past week that has been worsening.  He has noted a right groin bulge. Never noted before. One episode of emesis earlier this week otherwise has been eating.  Having bowel function. No fevers. He came to er as getting worse and nothing he was doing was helping at home.  Ct showed a right inguinal hernia and I was called.   History reviewed. No pertinent past medical history.  Past Surgical History:  Procedure Laterality Date   EXTERNAL FIXATION LEG Left 04/29/2018   Procedure: EXTERNAL FIXATION LEG;  Surgeon: Nadara Mustard, MD;  Location: [facility] OR;  Service: Orthopedics;  Laterality: Left;   FOOT SURGERY     left   ORIF ANKLE FRACTURE Left 04/29/2018   Procedure: OPEN REDUCTION INTERNAL FIXATION (ORIF) LEFT NAVICULAR AND EXTERNAL FIXATION;  Surgeon: Nadara Mustard, MD;  Location: [facility] OR;  Service: Orthopedics;  Laterality: Left;    Family History  Problem Relation Age of Onset   Diabetes Mother    Social History:  reports that he has been smoking cigarettes. He has been smoking an average of .5 packs per day. He has never used smokeless tobacco. He reports that he does not currently use alcohol. He reports that he does not currently use drugs after having used the following drugs: Marijuana.  Allergies: No Known Allergies  No current facility-administered medications on file prior to encounter.   Current Outpatient Medications on File Prior to Encounter  Medication Sig Dispense Refill   cyclobenzaprine (FLEXERIL) 10 MG tablet Take 1 tablet (10 mg total) by mouth 2 (two) times daily as needed for muscle spasms. (Patient not taking: Reported on 03/02/2022) 20 tablet 0   hydrOXYzine (ATARAX) 25 MG tablet Take 1 tablet (25 mg total) by mouth every 8 (eight) hours as needed for anxiety. (Patient not taking: Reported on 03/02/2022) 10  tablet 0   methocarbamol (ROBAXIN) 500 MG tablet Take 1 tablet (500 mg total) by mouth 2 (two) times daily. (Patient not taking: Reported on 03/02/2022) 20 tablet 0   naproxen (NAPROSYN) 375 MG tablet Take 1 tablet (375 mg total) by mouth 2 (two) times daily. (Patient not taking: Reported on 03/02/2022) 20 tablet 0   [DISCONTINUED] famotidine (PEPCID) 20 MG tablet Take 1 tablet (20 mg total) by mouth 2 (two) times daily. 30 tablet 0     Results for orders placed or performed during the hospital encounter of 03/02/22 (from the past 48 hour(s))  Lipase, blood     Status: None   Collection Time: 03/02/22  1:09 AM  Result Value Ref Range   Lipase 39 11 - 51 U/L    Comment: Performed at [facility], [address], [place], [place] [address]  Comprehensive metabolic panel     Status: None   Collection Time: 03/02/22  1:09 AM  Result Value Ref Range   Sodium 138 135 - 145 mmol/L   Potassium 3.6 3.5 - 5.1 mmol/L   Chloride 104 98 - 111 mmol/L   CO2 26 22 - 32 mmol/L   Glucose, Bld 95 70 - 99 mg/dL    Comment: Glucose reference range applies only to samples taken after fasting for at least 8 hours.   BUN 16 6 - 20 mg/dL   Creatinine, Ser 5.85 0.61 - 1.24 mg/dL   Calcium 9.4  8.9 - 10.3 mg/dL   Total Protein 8.1 6.5 - 8.1 g/dL   Albumin 4.9 3.5 - 5.0 g/dL   AST 20 15 - 41 U/L   ALT 16 0 - 44 U/L   Alkaline Phosphatase 57 38 - 126 U/L   Total Bilirubin 0.6 0.3 - 1.2 mg/dL   GFR, Estimated >60 >60 mL/min    Comment: (NOTE) Calculated using the CKD-EPI Creatinine Equation (2021)    Anion gap 8 5 - 15    Comment: Performed at [facility], [place] [address], [place], [place] [address]  CBC     Status: Abnormal   Collection Time: 03/02/22  1:09 AM  Result Value Ref Range   WBC 12.8 (H) 4.0 - 10.5 K/uL   RBC 4.43 4.22 - 5.81 MIL/uL   Hemoglobin 14.5 13.0 - 17.0 g/dL   HCT 42.2 39.0 - 52.0 %   MCV 95.3 80.0 - 100.0 fL   MCH 32.7 26.0 - 34.0 pg   MCHC  34.4 30.0 - 36.0 g/dL   RDW 12.1 11.5 - 15.5 %   Platelets 245 150 - 400 K/uL   nRBC 0.0 0.0 - 0.2 %    Comment: Performed at [facility], [place] [address] [place], [place] [address]  Urinalysis, Routine w reflex microscopic     Status: None   Collection Time: 03/02/22  1:09 AM  Result Value Ref Range   Color, Urine YELLOW YELLOW   APPearance CLEAR CLEAR   Specific Gravity, Urine 1.015 1.005 - 1.030   pH 5.0 5.0 - 8.0   Glucose, UA NEGATIVE NEGATIVE mg/dL   Hgb urine dipstick NEGATIVE NEGATIVE   Bilirubin Urine NEGATIVE NEGATIVE   Ketones, ur NEGATIVE NEGATIVE mg/dL   Protein, ur NEGATIVE NEGATIVE mg/dL   Nitrite NEGATIVE NEGATIVE   Leukocytes,Ua NEGATIVE NEGATIVE    Comment: Performed at [facility] [address] [place], [place] [address]   CT ABDOMEN PELVIS W CONTRAST  Result Date: 03/02/2022 CLINICAL DATA:  31 year old male with history of flank pain. Suspected kidney stone. EXAM: CT ABDOMEN AND PELVIS WITH CONTRAST TECHNIQUE: Multidetector CT imaging of the abdomen and pelvis was performed using the standard protocol following bolus administration of intravenous contrast. RADIATION DOSE REDUCTION: This exam was performed according to the departmental dose-optimization program which includes automated exposure control, adjustment of the mA and/or kV according to patient size and/or use of iterative reconstruction technique. CONTRAST:  142mL OMNIPAQUE IOHEXOL 300 MG/ML  SOLN COMPARISON:  No priors. FINDINGS: Lower chest: Unremarkable. Hepatobiliary: Mild diffuse low attenuation throughout the hepatic parenchyma, suggesting a background of mild hepatic steatosis (difficult to confirm on today's contrast enhanced study). No suspicious cystic or solid hepatic lesions. No intra or extrahepatic biliary ductal dilatation. Gallbladder is unremarkable in appearance. Pancreas: No pancreatic mass. No pancreatic ductal dilatation. No pancreatic or  peripancreatic fluid collections or inflammatory changes. Spleen: Unremarkable. Adrenals/Urinary Tract: Multiple subcentimeter low-attenuation lesions in both kidneys, too small to definitively characterize, but statistically likely to represent tiny cysts (no imaging follow-up is recommended). 4 mm nonobstructive calculus in the lower pole collecting system of the left kidney. No aggressive appearing renal lesions. No hydroureteronephrosis. Urinary bladder is normal in appearance. Left adrenal gland is normal in appearance. Right adrenal gland is densely calcified, presumably sequela of remote right adrenal infection and/or hemorrhage. Stomach/Bowel: The appearance of the stomach is normal. No pathologic dilatation of small bowel or colon. The appendix is not confidently identified and may be surgically  absent. Regardless, there are no inflammatory changes noted adjacent to the cecum to suggest the presence of an acute appendicitis at this time. Vascular/Lymphatic: No significant atherosclerotic disease, aneurysm or dissection noted in the abdominal or pelvic vasculature. No lymphadenopathy noted in the abdomen or pelvis. Reproductive: Prostate gland and seminal vesicles are unremarkable in appearance. Right-sided inguinal scrotal hernia containing only fat. This extends all the way to the level of the right testicle, and there is very mild fat stranding just proximal to the ostium of the hernia. Other: No significant volume of ascites.  No pneumoperitoneum. Musculoskeletal: There are no aggressive appearing lytic or blastic lesions noted in the visualized portions of the skeleton. IMPRESSION: 1. Large right inguinal scrotal hernia. No associated bowel incarceration or obstruction at this time. However, there is very mild fat stranding just proximal to the mouth of the hernia, which could suggest developing ischemia. Surgical consultation is recommended. 2. Probable mild hepatic steatosis. 3. 4 mm nonobstructive  calculus in the lower pole collecting system of the left kidney. 4. Additional incidental findings, as above. Electronically Signed   By: Trudie Reed M.D.   On: 03/02/2022 05:41    Review of Systems  Constitutional:  Negative for fever.  Gastrointestinal:  Positive for abdominal pain and vomiting. Negative for constipation, diarrhea and nausea.  All other systems reviewed and are negative.   Blood pressure 97/65, pulse 62, temperature 97.7 F (36.5 C), temperature source Oral, resp. rate 16, height 5\' 9"  (1.753 m), weight 67.6 kg, SpO2 98 %. Physical Exam Vitals reviewed.  Constitutional:      Appearance: He is well-developed.  Eyes:     General: No scleral icterus. Cardiovascular:     Rate and Rhythm: Normal rate.  Pulmonary:     Effort: Pulmonary effort is normal.  Abdominal:     Palpations: Abdomen is soft.     Tenderness: There is abdominal tenderness in the left upper quadrant and left lower quadrant.     Hernia: A hernia is present. Hernia is present in the right inguinal area (tender incarcerated hernia).  Skin:    General: Skin is warm and dry.     Capillary Refill: Capillary refill takes less than 2 seconds.  Neurological:     General: No focal deficit present.     Mental Status: He is alert.  Psychiatric:        Mood and Affect: Mood normal.        Behavior: Behavior normal.      Assessment/Plan Right inguinal hernia repair with mesh -We discussed open hernia repair today.  I described the procedure in detail. Goals should be achieved with surgery. We discussed the usage of mesh and the rationale behind that. We went over the pathophysiology of inguinal hernia. We have elected to perform open inguinal hernia repair with mesh.  We discussed the risks including bleeding, infection, recurrence, postoperative pain and chronic groin pain, testicular injury, urinary retention, numbness in groin and around incision. Will likely admit postoperatively.    , MD 03/02/2022, 6:54 AM

## 2022-03-02 NOTE — Anesthesia Preprocedure Evaluation (Addendum)
Anesthesia Evaluation  Patient identified by MRN, date of birth, ID band Patient awake    Reviewed: Allergy & Precautions, NPO status , Patient's Chart, lab work & pertinent test results  History of Anesthesia Complications Negative for: history of anesthetic complications  Airway Mallampati: III  TM Distance: >3 FB Neck ROM: Full    Dental  (+) Dental Advisory Given, Teeth Intact   Pulmonary Current Smoker and Patient abstained from smoking.,    Pulmonary exam normal        Cardiovascular negative cardio ROS Normal cardiovascular exam     Neuro/Psych negative neurological ROS  negative psych ROS   GI/Hepatic (+)     substance abuse  marijuana use,  Incarcerated inguinal hernia    Endo/Other  negative endocrine ROS  Renal/GU negative Renal ROS     Musculoskeletal negative musculoskeletal ROS (+)   Abdominal   Peds  Hematology negative hematology ROS (+)   Anesthesia Other Findings   Reproductive/Obstetrics                            Anesthesia Physical Anesthesia Plan  ASA: 2  Anesthesia Plan: General   Post-op Pain Management: Regional block*, Tylenol PO (pre-op)* and Celebrex PO (pre-op)*   Induction: Intravenous  PONV Risk Score and Plan: 2 and Treatment may vary due to age or medical condition, Ondansetron, Dexamethasone and Midazolam  Airway Management Planned: LMA  Additional Equipment: None  Intra-op Plan:   Post-operative Plan: Extubation in OR  Informed Consent: I have reviewed the patients History and Physical, chart, labs and discussed the procedure including the risks, benefits and alternatives for the proposed anesthesia with the patient or authorized representative who has indicated his/her understanding and acceptance.     Dental advisory given  Plan Discussed with: CRNA and Anesthesiologist  Anesthesia Plan Comments:        Anesthesia Quick  Evaluation

## 2022-03-02 NOTE — Anesthesia Procedure Notes (Signed)
Anesthesia Regional Block: TAP block   Pre-Anesthetic Checklist: , timeout performed,  Correct Patient, Correct Site, Correct Laterality,  Correct Procedure, Correct Position, site marked,  Risks and benefits discussed,  Surgical consent,  Pre-op evaluation,  At surgeon's request and post-op pain management  Laterality: Right  Prep: chloraprep       Needles:  Injection technique: Single-shot  Needle Type: Echogenic Needle     Needle Length: 10cm  Needle Gauge: 21     Additional Needles:   Narrative:  Start time: 03/02/2022 9:10 AM End time: 03/02/2022 9:13 AM Injection made incrementally with aspirations every 5 mL.  Performed by: Personally  Anesthesiologist: Audry Pili, MD  Additional Notes: No pain on injection. No increased resistance to injection. Injection made in 5cc increments. Good needle visualization. Patient tolerated the procedure well.

## 2022-03-02 NOTE — ED Notes (Signed)
Labeled specimen cup given to pt for urine collection per MD order. ENMiles 

## 2022-03-02 NOTE — Discharge Instructions (Signed)
[facility]  UMBILICAL OR INGUINAL HERNIA REPAIR: POST OP INSTRUCTIONS  Always review your discharge instruction sheet given to you by the facility where your surgery was performed. IF YOU HAVE DISABILITY OR FAMILY LEAVE FORMS, YOU MUST BRING THEM TO THE OFFICE FOR PROCESSING.   DO NOT GIVE THEM TO YOUR DOCTOR.  A  prescription for pain medication may be given to you upon discharge.  Take your pain medication as prescribed, if needed.  If narcotic pain medicine is not needed, then you may take acetaminophen (Tylenol), naprosyn (Alleve) or ibuprofen (Advil) as needed. Take your usually prescribed medications unless otherwise directed. If you need a refill on your pain medication, please contact your pharmacy.  They will contact our office to request authorization. Prescriptions will not be filled after 5 pm or on week-ends. You should follow a light diet the first 24 hours after arrival home, such as soup and crackers, etc.  Be sure to include lots of fluids daily.  Resume your normal diet the day after surgery. Most patients will experience some swelling and bruising around the umbilicus or in the groin and scrotum.  Ice packs and reclining will help.  Swelling and bruising can take several days to resolve.  It is common to experience some constipation if taking pain medication after surgery.  Increasing fluid intake and taking a stool softener (such as Colace) will usually help or prevent this problem from occurring.  A mild laxative (Milk of Magnesia or Miralax) should be taken according to package directions if there are no bowel movements after 48 hours. Unless discharge instructions indicate otherwise, you may remove your bandages 48 hours after surgery, and you may shower at that time.  You may have steri-strips (small skin tapes) in place directly over the incision.  These strips should be left on the skin for 7-10 days and will come off on their own.  If your surgeon used  skin glue on the incision, you may shower in 24 hours.  The glue will flake off over the next 2-3 weeks.  Any sutures or staples will be removed at the office during your follow-up visit. ACTIVITIES:  You may resume regular (light) daily activities beginning the next day--such as daily self-care, walking, climbing stairs--gradually increasing activities as tolerated.  You may have sexual intercourse when it is comfortable.  Refrain from any heavy lifting or straining until approved by your doctor. You may drive when you are no longer taking prescription pain medication, you can comfortably wear a seatbelt, and you can safely maneuver your car and apply brakes. RETURN TO WORK:  __________________________________________________________ You should see your doctor in the office for a follow-up appointment approximately 2-3 weeks after your surgery.  Make sure that you call for this appointment within a day or two after you arrive home to insure a convenient appointment time. OTHER INSTRUCTIONS:  __________________________________________________________________________________________________________________________________________________________________________________________  WHEN TO CALL YOUR DOCTOR: Fever over 101.0 Inability to urinate Nausea and/or vomiting Extreme swelling or bruising Continued bleeding from incision. Increased pain, redness, or drainage from the incision  The clinic staff is available to answer your questions during regular business hours.  Please don't hesitate to call and ask to speak to one of the nurses for clinical concerns.  If you have a medical emergency, go to the nearest emergency room or call 911.  A surgeon from [facility] is always on call at the hospital   [address], [address], [place], [place]    [address] ?  [address] [address], [place], [place]   [address] [phone] ? [phone] ? FAX [phone] Web site:  [email or web address]  

## 2022-03-03 ENCOUNTER — Encounter (HOSPITAL_COMMUNITY): Payer: Self-pay | Admitting: General Surgery

## 2022-03-06 NOTE — Discharge Summary (Signed)
Physician Discharge Summary  Patient ID: Aaron Fry MRN: [id number] DOB/AGE: Dec 08, 1990 31 y.o.  Admit date: 03/02/2022 Discharge date: 03/06/2022  Admission Diagnoses: Incarcerated RIH  Discharge Diagnoses:  Principal Problem:   H/O right inguinal hernia repair   Discharged Condition: good  Hospital Course: 66 yom with fat containing incarcerated RIH.  Taken to OR for repair, doing well postop and was discharged home same day  Consults: None  Significant Diagnostic Studies: ct with incarcerated hernia  Treatments: surgery: RIH with mesh  Discharge Exam: Blood pressure 103/67, pulse 60, temperature 97.6 F (36.4 C), temperature source Oral, resp. rate 18, height 5\' 9"  (1.753 m), weight 67.6 kg, SpO2 100 %.   Disposition: Discharge disposition: 01-Home or Self Care       Discharge Instructions     Call MD for:  difficulty breathing, headache or visual disturbances   Complete by: As directed    Call MD for:  hives   Complete by: As directed    Call MD for:  persistant nausea and vomiting   Complete by: As directed    Call MD for:  redness, tenderness, or signs of infection (pain, swelling, redness, odor or green/yellow discharge around incision site)   Complete by: As directed    Call MD for:  severe uncontrolled pain   Complete by: As directed    Call MD for:  temperature >100.4   Complete by: As directed    Diet - low sodium heart healthy   Complete by: As directed    Increase activity slowly   Complete by: As directed       Allergies as of 03/02/2022   No Known Allergies      Medication List     TAKE these medications    cyclobenzaprine 10 MG tablet Commonly known as: FLEXERIL Take 1 tablet (10 mg total) by mouth 2 (two) times daily as needed for muscle spasms.   hydrOXYzine 25 MG tablet Commonly known as: ATARAX Take 1 tablet (25 mg total) by mouth every 8 (eight) hours as needed for anxiety.   methocarbamol 500 MG tablet Commonly  known as: ROBAXIN Take 1 tablet (500 mg total) by mouth 2 (two) times daily.   naproxen 375 MG tablet Commonly known as: NAPROSYN Take 1 tablet (375 mg total) by mouth 2 (two) times daily.   oxyCODONE 5 MG immediate release tablet Commonly known as: Oxy IR/ROXICODONE Take 1 tablet (5 mg total) by mouth every 4 (four) hours as needed for moderate pain.        [facility]-up [facility] Follow up in 3 week(s).   Specialty: General Surgery Contact information: [address] [place] [phone]                Signed: Rolm Bookbinder 03/06/2022, 10:27 AM

## 2022-04-10 ENCOUNTER — Encounter (HOSPITAL_COMMUNITY): Payer: Self-pay

## 2022-04-10 ENCOUNTER — Other Ambulatory Visit: Payer: Self-pay

## 2022-04-10 ENCOUNTER — Emergency Department (HOSPITAL_COMMUNITY)
Admission: EM | Admit: 2022-04-10 | Discharge: 2022-04-10 | Disposition: A | Discharge status: Home / Self Care | Payer: Managed Care, Other (non HMO) | Attending: Emergency Medicine | Admitting: Emergency Medicine

## 2022-04-10 ENCOUNTER — Emergency Department (HOSPITAL_COMMUNITY): Payer: Managed Care, Other (non HMO)

## 2022-04-10 DIAGNOSIS — S59201A Unspecified physeal fracture of lower end of radius, right arm, initial encounter for closed fracture: Secondary | ICD-10-CM | POA: Diagnosis not present

## 2022-04-10 DIAGNOSIS — W010XXA Fall on same level from slipping, tripping and stumbling without subsequent striking against object, initial encounter: Secondary | ICD-10-CM | POA: Insufficient documentation

## 2022-04-10 DIAGNOSIS — S6991XA Unspecified injury of right wrist, hand and finger(s), initial encounter: Secondary | ICD-10-CM | POA: Diagnosis present

## 2022-04-10 DIAGNOSIS — S52501A Unspecified fracture of the lower end of right radius, initial encounter for closed fracture: Secondary | ICD-10-CM

## 2022-04-10 MED ORDER — ACETAMINOPHEN 500 MG PO TABS
1000.0000 mg | ORAL_TABLET | Freq: Once | ORAL | Status: AC
  Administered 2022-04-10: 1000 mg via ORAL
  Filled 2022-04-10: qty 2

## 2022-04-10 MED ORDER — HYDROCODONE-ACETAMINOPHEN 5-325 MG PO TABS: 1.0000 | ORAL_TABLET | ORAL | 0 refills | Status: DC | PRN

## 2022-04-10 MED ORDER — NAPROXEN 500 MG PO TABS: 500.0000 mg | ORAL_TABLET | Freq: Two times a day (BID) | ORAL | 0 refills | Status: DC

## 2022-04-10 NOTE — Discharge Instructions (Addendum)
You were diagnosed today with a fracture of the distal radius of the right arm.  This was splinted.  Please keep the splint dry and clean.  Call the number listed above to follow-up with hand surgery for further evaluation and management.  I have prescribed 2 days of narcotic pain medication along with anti-inflammatories.  Once the pain medication has been used please switch to regular exercise Tylenol as needed.

## 2022-04-10 NOTE — ED Provider Triage Note (Signed)
Emergency Medicine Provider Triage Evaluation Note  Aaron Fry , a 31 y.o. male  was evaluated in triage.  Pt complains of right-sided wrist pain.  Patient states he slipped and fell yesterday falling backwards catching himself with his right wrist behind him.  He states he heard a pop when it happened and has pain in the general right wrist region.  He endorses being able to move his hand normally with mild swelling but does endorse significant pain  Review of Systems  Positive: As above Negative: As above  Physical Exam  BP 109/64   Pulse (!) 52   Temp 98.5 F (36.9 C) (Oral)   Resp 19   SpO2 99%  Gen:   Awake, no distress   Resp:  Normal effort  MSK:   Moves extremities without difficulty  Other:  Snuffbox tenderness right hand  Medical Decision Making  Medically screening exam initiated at 2:01 PM.  Appropriate orders placed.  Aaron Fry was informed that the remainder of the evaluation will be completed by another provider, this initial triage assessment does not replace that evaluation, and the importance of remaining in the ED until their evaluation is complete.     Aaron Peng, PA-C 04/10/22 1402

## 2022-04-10 NOTE — ED Provider Notes (Signed)
[facility]-EMERGENCY DEPT Provider Note   CSN: [id number] Arrival date & time: 04/10/22  1312     History  Chief Complaint  Patient presents with   Wrist Pain    Aaron Fry is a 31 y.o. male.  Patient presents to the emergency department complaining of right-sided wrist pain secondary to a fall.  Patient states he was walking carrying his nephew in his left arm yesterday when he slipped, falling backwards and landing on his right hand.  He complains of immediate pain in the right wrist and feels that he may have heard a pop at the same time.  He denies any other injury.  No relevant past medical history on file  HPI     Home Medications Prior to Admission medications   Medication Sig Start Date End Date Taking? Authorizing Provider  HYDROcodone-acetaminophen (NORCO/VICODIN) 5-325 MG tablet Take 1 tablet by mouth every 4 (four) hours as needed. 04/10/22  Yes Darrick Grinder, PA-C  naproxen (NAPROSYN) 500 MG tablet Take 1 tablet (500 mg total) by mouth 2 (two) times daily. 04/10/22  Yes Darrick Grinder, PA-C  cyclobenzaprine (FLEXERIL) 10 MG tablet Take 1 tablet (10 mg total) by mouth 2 (two) times daily as needed for muscle spasms. Patient not taking: Reported on 03/02/2022 10/08/21   Carroll Sage, PA-C  hydrOXYzine (ATARAX) 25 MG tablet Take 1 tablet (25 mg total) by mouth every 8 (eight) hours as needed for anxiety. Patient not taking: Reported on 03/02/2022 09/29/21   Benjiman Core, MD  methocarbamol (ROBAXIN) 500 MG tablet Take 1 tablet (500 mg total) by mouth 2 (two) times daily. Patient not taking: Reported on 03/02/2022 12/03/21   Marita Kansas, PA-C  oxyCODONE (OXY IR/ROXICODONE) 5 MG immediate release tablet Take 1 tablet (5 mg total) by mouth every 4 (four) hours as needed for moderate pain. 03/02/22   Almond Lint, MD  famotidine (PEPCID) 20 MG tablet Take 1 tablet (20 mg total) by mouth 2 (two) times daily. 05/30/19 03/12/20  Gilda Crease, MD      Allergies    Patient has no known allergies.    Review of Systems   Review of Systems  Physical Exam Updated Vital Signs BP 109/64   Pulse (!) 52   Temp 98.5 F (36.9 C) (Oral)   Resp 19   SpO2 99%  Physical Exam  ED Results / Procedures / Treatments   Labs (all labs ordered are listed, but only abnormal results are displayed) Labs Reviewed - No data to display  EKG None  Radiology DG Wrist Complete Right  Result Date: 04/10/2022 CLINICAL DATA:  RIGHT wrist and hand pain post fall EXAM: RIGHT WRIST - COMPLETE 3+ VIEW COMPARISON:  RIGHT hand radiographs 04/10/2022 FINDINGS: Osseous mineralization normal. Joint spaces preserved. Nondisplaced distal RIGHT radial metaphyseal fracture. No definite intra-articular extension. Slight dorsal tilt of distal radial articular surface. No additional fracture, dislocation, or bone destruction. IMPRESSION: Nondisplaced distal RIGHT radial metaphyseal fracture. Electronically Signed   By: Ulyses Southward M.D.   On: 04/10/2022 14:27   DG Hand Complete Right  Result Date: 04/10/2022 CLINICAL DATA:  RIGHT wrist and hand pain post fall EXAM: RIGHT HAND - COMPLETE 3+ VIEW COMPARISON:  None FINDINGS: Osseous mineralization normal. Joint spaces preserved. Transverse metaphyseal fracture distal RIGHT radius, nondisplaced. No additional fracture, dislocation, or bone destruction. IMPRESSION: Nondisplaced distal RIGHT radial metaphyseal fracture. Electronically Signed   By: Ulyses Southward M.D.   On: 04/10/2022 14:25  Procedures .Ortho Injury Treatment  Date/Time: 04/10/2022 4:39 PM  Performed by: Dorothyann Peng, PA-C Authorized by: Dorothyann Peng, PA-C   Consent:    Consent obtained:  Verbal   Consent given by:  Patient   Risks discussed:  Restricted joint movement, vascular damage and stiffness   Alternatives discussed:  No treatmentInjury location: forearm Location details: right forearm Injury type: fracture Fracture  type: distal radius Pre-procedure neurovascular assessment: neurovascularly intact Immobilization: splint Splint type: sugar tong Splint Applied by: Sheliah Hatch Post-procedure neurovascular assessment: post-procedure neurovascularly intact       Medications Ordered in ED Medications  acetaminophen (TYLENOL) tablet 1,000 mg (1,000 mg Oral Given 04/10/22 1729)    ED Course/ Medical Decision Making/ A&P                           Medical Decision Making Amount and/or Complexity of Data Reviewed Radiology: ordered.  Risk OTC drugs. Prescription drug management.   Patient presents with a chief complaint of right-sided wrist pain.  Differential diagnosis includes but is not limited to fracture, dislocation, soft tissue injury, and others  I viewed the patient's past medical history and found orthopedic visits due to foot fractures in the past but no relevant visits for today's complaint  I ordered and personally interpreted imaging including plain films of the right wrist and hand.  Nondisplaced distal right radial metaphyseal fracture.  I agree with radiologist findings  The patient was placed in a sugar-tong splint as noted above.  I ordered the patient Tylenol for pain.  Upon reassessment the patient was feeling somewhat better.  The patient has a fracture of the distal right radius.  This was splinted while here in the emergency department.  This will need follow-up with hand surgery as an outpatient.  Patient was prescribed a short course of narcotic pain medication and anti-inflammatory medicines.  Discharge home with outpatient follow-up        Final Clinical Impression(s) / ED Diagnoses Final diagnoses:  Closed fracture of distal end of right radius, unspecified fracture morphology, initial encounter    Rx / DC Orders ED Discharge Orders          Ordered    HYDROcodone-acetaminophen (NORCO/VICODIN) 5-325 MG tablet  Every 4 hours PRN        04/10/22 1737     naproxen (NAPROSYN) 500 MG tablet  2 times daily        04/10/22 1737              Ronny Bacon 04/10/22 1738    Long, Wonda Olds, MD 04/15/22 [phone]

## 2022-04-10 NOTE — ED Triage Notes (Signed)
Patient fell onto his right wrist. He said he still has sensation but it hurts to move it.

## 2022-10-23 ENCOUNTER — Encounter: Payer: Self-pay | Admitting: Emergency Medicine

## 2022-10-23 ENCOUNTER — Other Ambulatory Visit: Payer: Self-pay

## 2022-10-23 DIAGNOSIS — W06XXXA Fall from bed, initial encounter: Secondary | ICD-10-CM | POA: Insufficient documentation

## 2022-10-23 DIAGNOSIS — M545 Low back pain, unspecified: Secondary | ICD-10-CM | POA: Insufficient documentation

## 2022-10-23 DIAGNOSIS — Z5321 Procedure and treatment not carried out due to patient leaving prior to being seen by health care provider: Secondary | ICD-10-CM | POA: Insufficient documentation

## 2022-10-23 NOTE — ED Triage Notes (Signed)
Patient ambulatory to triage with steady gait, without difficulty or distress noted; pt reports that he fell off his bed a wk ago and cont to have lower back pain

## 2022-10-24 ENCOUNTER — Encounter (HOSPITAL_COMMUNITY): Payer: Self-pay

## 2022-10-24 ENCOUNTER — Emergency Department (HOSPITAL_COMMUNITY): Payer: Managed Care, Other (non HMO)

## 2022-10-24 ENCOUNTER — Emergency Department (HOSPITAL_COMMUNITY)
Admission: EM | Admit: 2022-10-24 | Discharge: 2022-10-24 | Disposition: A | Discharge status: Home / Self Care | Payer: Managed Care, Other (non HMO) | Attending: Emergency Medicine | Admitting: Emergency Medicine

## 2022-10-24 ENCOUNTER — Emergency Department
Admission: EM | Admit: 2022-10-24 | Discharge: 2022-10-24 | Discharge status: Against Medical Advice | Payer: Self-pay | Attending: Emergency Medicine | Admitting: Emergency Medicine

## 2022-10-24 ENCOUNTER — Other Ambulatory Visit: Payer: Self-pay

## 2022-10-24 DIAGNOSIS — M545 Low back pain, unspecified: Secondary | ICD-10-CM | POA: Insufficient documentation

## 2022-10-24 DIAGNOSIS — W06XXXA Fall from bed, initial encounter: Secondary | ICD-10-CM | POA: Insufficient documentation

## 2022-10-24 MED ORDER — NAPROXEN 375 MG PO TABS: 375.0000 mg | ORAL_TABLET | Freq: Two times a day (BID) | ORAL | 0 refills | Status: AC

## 2022-10-24 NOTE — ED Notes (Signed)
No answer when called several times from lobby 

## 2022-10-24 NOTE — Discharge Instructions (Addendum)
Your x-ray on today's visit did not show any acute findings.  You were given a short course of anti-inflammatory to help with your symptoms.  Please take 1 tablet twice a day with food for the next 7 days.

## 2022-10-24 NOTE — ED Triage Notes (Signed)
Patient fell of the bed last week after being drunk. Pain in the right lower back.

## 2022-10-24 NOTE — ED Provider Notes (Signed)
[place] EMERGENCY DEPARTMENT AT [facility] Provider Note   CSN: [id number] Arrival date & time: 10/24/22  1755     History  Chief Complaint  Patient presents with   Back Pain    Aaron Fry is a 32 y.o. male.  32 y.o male with no PMH presents to the ED with a chief complaint of low back pain x 1 week.  Patient reports approximately 1 week ago he fell off his bed, he continues to have right-sided low back pain has been ongoing.  He has been taking Tylenol without any improvement in symptoms.  He has not been applying ice or heat.  No alleviating or exacerbating factors.  He does not have any prior history of IV drug use, fever, urinary symptoms.  The history is provided by the patient.  Back Pain Location:  Lumbar spine Associated symptoms: no fever        Home Medications Prior to Admission medications   Medication Sig Start Date End Date Taking? Authorizing Provider  naproxen (NAPROSYN) 375 MG tablet Take 1 tablet (375 mg total) by mouth 2 (two) times daily for 7 days. 10/24/22 10/31/22 Yes Taisley Mordan, Leonie Douglas, PA-C  cyclobenzaprine (FLEXERIL) 10 MG tablet Take 1 tablet (10 mg total) by mouth 2 (two) times daily as needed for muscle spasms. Patient not taking: Reported on 03/02/2022 10/08/21   Carroll Sage, PA-C  HYDROcodone-acetaminophen (NORCO/VICODIN) 5-325 MG tablet Take 1 tablet by mouth every 4 (four) hours as needed. 04/10/22   Darrick Grinder, PA-C  hydrOXYzine (ATARAX) 25 MG tablet Take 1 tablet (25 mg total) by mouth every 8 (eight) hours as needed for anxiety. Patient not taking: Reported on 03/02/2022 09/29/21   Benjiman Core, MD  methocarbamol (ROBAXIN) 500 MG tablet Take 1 tablet (500 mg total) by mouth 2 (two) times daily. Patient not taking: Reported on 03/02/2022 12/03/21   Marita Kansas, PA-C  oxyCODONE (OXY IR/ROXICODONE) 5 MG immediate release tablet Take 1 tablet (5 mg total) by mouth every 4 (four) hours as needed for moderate pain. 03/02/22    Almond Lint, MD  famotidine (PEPCID) 20 MG tablet Take 1 tablet (20 mg total) by mouth 2 (two) times daily. 05/30/19 03/12/20  Gilda Crease, MD      Allergies    Patient has no known allergies.    Review of Systems   Review of Systems  Constitutional:  Negative for fever.  Genitourinary:  Negative for difficulty urinating.  Musculoskeletal:  Positive for back pain.    Physical Exam Updated Vital Signs BP 117/71 (BP Location: Left Arm)   Pulse 66   Temp 98.4 F (36.9 C) (Oral)   Resp 18   Ht 5\' 9"  (1.753 m)   Wt 63.5 kg   SpO2 99%   BMI 20.67 kg/m  Physical Exam Vitals and nursing note reviewed.  Constitutional:      Appearance: Normal appearance.  HENT:     Head: Normocephalic and atraumatic.  Cardiovascular:     Rate and Rhythm: Normal rate.  Pulmonary:     Effort: Pulmonary effort is normal.  Abdominal:     General: Abdomen is flat.  Musculoskeletal:     Cervical back: Normal range of motion and neck supple.     Lumbar back: Normal.     Comments: RLE- KF,KE 5/5 strength LLE- HF, HE 5/5 strength Normal gait. No pronator drift. No leg drop. . CN I, II and VIII not tested. CN II-XII grossly intact bilaterally.  Skin:    General: Skin is warm and dry.  Neurological:     Mental Status: He is alert and oriented to person, place, and time.     ED Results / Procedures / Treatments   Labs (all labs ordered are listed, but only abnormal results are displayed) Labs Reviewed - No data to display  EKG None  Radiology DG Lumbar Spine Complete  Result Date: 10/24/2022 CLINICAL DATA:  Back pain. Patient fell off the bed last night after being trunk. EXAM: LUMBAR SPINE - COMPLETE 4+ VIEW COMPARISON:  CT examination dated March 02, 2022 FINDINGS: There is no evidence of lumbar spine fracture. Alignment is normal. Intervertebral disc spaces are maintained. Calcification about the right twelfth rib, representing prior adrenal calcification, as seen  on prior CT examination. IMPRESSION: Negative. Electronically Signed   By: Larose Hires D.O.   On: 10/24/2022 18:43    Procedures Procedures    Medications Ordered in ED Medications - No data to display  ED Course/ Medical Decision Making/ A&P                             Medical Decision Making Amount and/or Complexity of Data Reviewed Radiology: ordered.   Patient presents to the ED with a chief complaint of low back pain has been ongoing for about 1 week after falling off his bed.  Has taken Tylenol without any improvement in symptoms.  His exam is benign, he is ambulatory with a steady gait.  Pain palpable along the right side of his lumbar spine.  No prior history of IV drug use, no fever, no bowel or bladder complaints.  X-ray obtained while in the ED did not show any acute findings.  We discussed supportive treatment with anti-inflammatories, ice, heat to help with symptomatic treatment.  Patient is hemodynamically stable for discharge.  Portions of this note were generated with Scientist, clinical (histocompatibility and immunogenetics). Dictation errors may occur despite best attempts at proofreading.   Final Clinical Impression(s) / ED Diagnoses Final diagnoses:  Acute right-sided low back pain without sciatica    Rx / DC Orders ED Discharge Orders          Ordered    naproxen (NAPROSYN) 375 MG tablet  2 times daily        10/24/22 1907              Claude Manges, Cordelia Poche 10/24/22 1910    Gwyneth Sprout, MD 10/24/22 2329

## 2023-05-26 ENCOUNTER — Ambulatory Visit: Payer: Self-pay

## 2023-07-11 ENCOUNTER — Emergency Department (HOSPITAL_COMMUNITY)
Admission: EM | Admit: 2023-07-11 | Discharge: 2023-07-11 | Disposition: A | Discharge status: Home / Self Care | Payer: Self-pay | Attending: Emergency Medicine | Admitting: Emergency Medicine

## 2023-07-11 ENCOUNTER — Encounter (HOSPITAL_COMMUNITY): Payer: Self-pay

## 2023-07-11 ENCOUNTER — Emergency Department (HOSPITAL_COMMUNITY): Payer: Self-pay

## 2023-07-11 ENCOUNTER — Other Ambulatory Visit: Payer: Self-pay

## 2023-07-11 DIAGNOSIS — M25531 Pain in right wrist: Secondary | ICD-10-CM | POA: Insufficient documentation

## 2023-07-11 DIAGNOSIS — Z5321 Procedure and treatment not carried out due to patient leaving prior to being seen by health care provider: Secondary | ICD-10-CM | POA: Insufficient documentation

## 2023-07-11 DIAGNOSIS — X500XXA Overexertion from strenuous movement or load, initial encounter: Secondary | ICD-10-CM | POA: Insufficient documentation

## 2023-07-11 NOTE — ED Triage Notes (Signed)
C/o  right wrist pain that radiates into hand.  Pt reports prior injury.  Pain started this am after lifting something heavy. Denies numbness

## 2023-07-11 NOTE — ED Provider Triage Note (Signed)
Emergency Medicine Provider Triage Evaluation Note  Aaron Fry , a 33 y.o. male  was evaluated in triage.  Pt complains of right wrist pain, radiates into hand. Prior injury. Lifted something this morning and hurt right wrist at scaphoid. No meds PTA., No numbness, weakness. No redness, warmth. Left hand domin  Review of Systems  Positive: Right wrist pain Negative:   Physical Exam  BP 124/73 (BP Location: Left Arm)   Pulse 98   Temp 98.1 F (36.7 C) (Oral)   Resp 18   SpO2 97%  Gen:   Awake, no distress   Resp:  Normal effort  MSK:   Moves extremities without difficulty, tenderness at right scaphoid Other:    Medical Decision Making  Medically screening exam initiated at 5:02 PM.  Appropriate orders placed.  Yvonne Kendall was informed that the remainder of the evaluation will be completed by another provider, this initial triage assessment does not replace that evaluation, and the importance of remaining in the ED until their evaluation is complete.  Right wrist pain   Island Dohmen A, PA-C 07/11/23 1703

## 2023-09-23 ENCOUNTER — Emergency Department (HOSPITAL_COMMUNITY)
Admission: EM | Admit: 2023-09-23 | Discharge: 2023-09-23 | Disposition: A | Discharge status: Home / Self Care | Payer: Self-pay | Attending: Emergency Medicine | Admitting: Emergency Medicine

## 2023-09-23 ENCOUNTER — Emergency Department (HOSPITAL_COMMUNITY): Payer: Self-pay

## 2023-09-23 DIAGNOSIS — M79672 Pain in left foot: Secondary | ICD-10-CM | POA: Insufficient documentation

## 2023-09-23 MED ORDER — NAPROXEN 375 MG PO TABS: 375.0000 mg | ORAL_TABLET | Freq: Two times a day (BID) | ORAL | 0 refills | Status: DC

## 2023-09-23 NOTE — ED Triage Notes (Signed)
 Patient is complaining of left foot pain. Broke foot back 2019, had surgery to repair- has metal plate and screws in foot. States he woke up this afternoon and could not put any weight on it. States it hurts to touch top of his foot. Patient grimaced when triage nurse attempted to touch top of his foot. No swelling, redness or deformity observed. Patient rates pain 10/10. Denies any recent injury to foot.

## 2023-09-23 NOTE — ED Provider Notes (Signed)
 [place] EMERGENCY DEPARTMENT AT [facility] Provider Note   CSN: [id number] Arrival date & time: 09/23/23  1542     History  No chief complaint on file.   Aaron Fry is a 33 y.o. male.  Patient is a 33 year old male who presents with pain in his left foot.  He previously broke his foot.  On chart review it looks like this was in 2019 where he sustained a navicular fracture that required operative repair.  He states he woke up this morning and noticed pain in it.  It has been hurting to walk on it.  Denies any known injuries.  He does state he walks on it a lot.  No fevers.         Home Medications Prior to Admission medications   Medication Sig Start Date End Date Taking? Authorizing Provider  naproxen (NAPROSYN) 375 MG tablet Take 1 tablet (375 mg total) by mouth 2 (two) times daily. 09/23/23  Yes Rolan Bucco, MD  cyclobenzaprine (FLEXERIL) 10 MG tablet Take 1 tablet (10 mg total) by mouth 2 (two) times daily as needed for muscle spasms. Patient not taking: Reported on 03/02/2022 10/08/21   Carroll Sage, PA-C  HYDROcodone-acetaminophen (NORCO/VICODIN) 5-325 MG tablet Take 1 tablet by mouth every 4 (four) hours as needed. 04/10/22   Darrick Grinder, PA-C  hydrOXYzine (ATARAX) 25 MG tablet Take 1 tablet (25 mg total) by mouth every 8 (eight) hours as needed for anxiety. Patient not taking: Reported on 03/02/2022 09/29/21   Benjiman Core, MD  methocarbamol (ROBAXIN) 500 MG tablet Take 1 tablet (500 mg total) by mouth 2 (two) times daily. Patient not taking: Reported on 03/02/2022 12/03/21   Marita Kansas, PA-C  oxyCODONE (OXY IR/ROXICODONE) 5 MG immediate release tablet Take 1 tablet (5 mg total) by mouth every 4 (four) hours as needed for moderate pain. 03/02/22   Almond Lint, MD  famotidine (PEPCID) 20 MG tablet Take 1 tablet (20 mg total) by mouth 2 (two) times daily. 05/30/19 03/12/20  Gilda Crease, MD      Allergies    Patient has no known  allergies.    Review of Systems   Review of Systems  Constitutional:  Negative for fever.  Gastrointestinal:  Negative for nausea and vomiting.  Musculoskeletal:  Positive for arthralgias. Negative for back pain, joint swelling and neck pain.  Skin:  Negative for wound.  Neurological:  Negative for weakness, numbness and headaches.    Physical Exam Updated Vital Signs BP 114/68 (BP Location: Right Arm)   Pulse 72   Temp 98.2 F (36.8 C) (Oral)   Resp 16   Ht 5\' 9"  (1.753 m)   Wt 63.5 kg   SpO2 100%   BMI 20.67 kg/m  Physical Exam Constitutional:      Appearance: He is well-developed.  HENT:     Head: Normocephalic and atraumatic.  Cardiovascular:     Rate and Rhythm: Normal rate.  Pulmonary:     Effort: Pulmonary effort is normal.  Musculoskeletal:        General: Tenderness present.     Cervical back: Normal range of motion and neck supple.     Comments: Patient has a prior surgical scar to the dorsum of the left foot toward the medial side.  There is generalized tenderness around this area.  No swelling to the foot is noted.  No warmth or erythema.  Pedal pulses are intact.  He has normal sensation and motor function  in the foot.  Skin:    General: Skin is warm and dry.  Neurological:     Mental Status: He is alert and oriented to person, place, and time.     ED Results / Procedures / Treatments   Labs (all labs ordered are listed, but only abnormal results are displayed) Labs Reviewed - No data to display  EKG None  Radiology DG Foot Complete Left Result Date: 09/23/2023 CLINICAL DATA:  Left foot pain EXAM: LEFT FOOT - COMPLETE 3+ VIEW COMPARISON:  None Available. FINDINGS: Surgical changes of navicular ORIF utilizing multiple dorsal plates and screws noted. 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 No acute fracture or dislocation. Remaining joint spaces are preserved. Soft tissues are unremarkable. IMPRESSION: 1. Surgical changes of  navicular ORIF. No acute fracture or dislocation. Electronically Signed   By: Worthy Heads M.D.   On: 09/23/2023 19:30    Procedures Procedures    Medications Ordered in ED Medications - No data to display  ED Course/ Medical Decision Making/ A&P                                 Medical Decision Making Amount and/or Complexity of Data Reviewed Radiology: ordered.  Risk Prescription drug management.   Patient is a 33 year old who presents with pain in his foot.  It is in the area where he previously had ORIF of the navicular bone fracture.  He denies any new injuries.  There is no significant swelling or concerns for infection on clinical exam.  X-rays were performed which showed no new fractures or dislodgment of the hardware.  This was interpreted by me and confirmed by the radiologist.  He was discharged home in good condition.  Was advised on symptomatic care.  Was given a prescription for Naprosyn was given information about following up with his prior orthopedist, Dr. Julio Ohm if his symptoms are improving.  Return precautions were given.  Final Clinical Impression(s) / ED Diagnoses Final diagnoses:  Left foot pain    Rx / DC Orders ED Discharge Orders          Ordered    naproxen (NAPROSYN) 375 MG tablet  2 times daily        09/23/23 2053              Hershel Los, MD 09/23/23 2054

## 2023-09-27 ENCOUNTER — Emergency Department (HOSPITAL_COMMUNITY)
Admission: EM | Admit: 2023-09-27 | Discharge: 2023-09-27 | Disposition: A | Discharge status: Home / Self Care | Payer: Self-pay | Attending: Emergency Medicine | Admitting: Emergency Medicine

## 2023-09-27 ENCOUNTER — Other Ambulatory Visit: Payer: Self-pay

## 2023-09-27 ENCOUNTER — Emergency Department (HOSPITAL_COMMUNITY): Payer: Self-pay

## 2023-09-27 ENCOUNTER — Encounter (HOSPITAL_COMMUNITY): Payer: Self-pay | Admitting: *Deleted

## 2023-09-27 DIAGNOSIS — R310 Gross hematuria: Secondary | ICD-10-CM

## 2023-09-27 DIAGNOSIS — N202 Calculus of kidney with calculus of ureter: Secondary | ICD-10-CM | POA: Insufficient documentation

## 2023-09-27 DIAGNOSIS — N201 Calculus of ureter: Secondary | ICD-10-CM

## 2023-09-27 LAB — COMPREHENSIVE METABOLIC PANEL WITH GFR
ALT: 11 U/L (ref 0–44)
AST: 17 U/L (ref 15–41)
Albumin: 3.9 g/dL (ref 3.5–5.0)
Alkaline Phosphatase: 49 U/L (ref 38–126)
Anion gap: 10 (ref 5–15)
BUN: 13 mg/dL (ref 6–20)
CO2: 26 mmol/L (ref 22–32)
Calcium: 9 mg/dL (ref 8.9–10.3)
Chloride: 102 mmol/L (ref 98–111)
Creatinine, Ser: 0.78 mg/dL (ref 0.61–1.24)
GFR, Estimated: >60 mL/min (ref >60)
Glucose, Bld: 92 mg/dL (ref 70–99)
Potassium: 3.7 mmol/L (ref 3.5–5.1)
Sodium: 138 mmol/L (ref 135–145)
Total Bilirubin: 0.4 mg/dL (ref 0.0–1.2)
Total Protein: 6.5 g/dL (ref 6.5–8.1)

## 2023-09-27 LAB — URINALYSIS, ROUTINE W REFLEX MICROSCOPIC
Bilirubin Urine: NEGATIVE
Glucose, UA: NEGATIVE mg/dL
Ketones, ur: NEGATIVE mg/dL
Nitrite: NEGATIVE
Protein, ur: 100 mg/dL — AB
RBC / HPF: >50 RBC/hpf (ref 0–5)
Specific Gravity, Urine: 1.024 (ref 1.005–1.030)
pH: 7 (ref 5.0–8.0)

## 2023-09-27 LAB — CK: Total CK: 57 U/L (ref 49–397)

## 2023-09-27 LAB — CBC WITH DIFFERENTIAL/PLATELET
Abs Immature Granulocytes: 0.02 10*3/uL (ref 0.00–0.07)
Basophils Absolute: 0.1 10*3/uL (ref 0.0–0.1)
Basophils Relative: 1 %
Eosinophils Absolute: 0.1 10*3/uL (ref 0.0–0.5)
Eosinophils Relative: 1 %
HCT: 39.9 % (ref 39.0–52.0)
Hemoglobin: 13.4 g/dL (ref 13.0–17.0)
Immature Granulocytes: 0 %
Lymphocytes Relative: 23 %
Lymphs Abs: 2 10*3/uL (ref 0.7–4.0)
MCH: 32.4 pg (ref 26.0–34.0)
MCHC: 33.6 g/dL (ref 30.0–36.0)
MCV: 96.6 fL (ref 80.0–100.0)
Monocytes Absolute: 0.6 10*3/uL (ref 0.1–1.0)
Monocytes Relative: 7 %
Neutro Abs: 5.9 10*3/uL (ref 1.7–7.7)
Neutrophils Relative %: 68 %
Platelets: 208 10*3/uL (ref 150–400)
RBC: 4.13 MIL/uL — ABNORMAL LOW (ref 4.22–5.81)
RDW: 12.8 % (ref 11.5–15.5)
WBC: 8.6 10*3/uL (ref 4.0–10.5)
nRBC: 0 % (ref 0.0–0.2)

## 2023-09-27 MED ORDER — TAMSULOSIN HCL 0.4 MG PO CAPS: 0.4000 mg | ORAL_CAPSULE | Freq: Two times a day (BID) | ORAL | 0 refills | Status: DC

## 2023-09-27 MED ORDER — OXYCODONE HCL 5 MG PO TABS: 5.0000 mg | ORAL_TABLET | ORAL | 0 refills | Status: DC | PRN

## 2023-09-27 MED ORDER — KETOROLAC TROMETHAMINE 10 MG PO TABS: 10.0000 mg | ORAL_TABLET | Freq: Four times a day (QID) | ORAL | 0 refills | Status: DC | PRN

## 2023-09-27 MED ORDER — ONDANSETRON HCL 4 MG PO TABS: 4.0000 mg | ORAL_TABLET | Freq: Three times a day (TID) | ORAL | 0 refills | Status: DC | PRN

## 2023-09-27 NOTE — Discharge Instructions (Addendum)
Contact a health care provider if:  You have pain that gets worse or does not get better with medicine.  Get help right away if:  You have a fever or chills.  You develop severe pain.  You develop new abdominal pain.  You faint.  You are unable to urinate.

## 2023-09-27 NOTE — ED Triage Notes (Signed)
 Pt states that he noted "a color difference" in urine yesterday and thought it might be blood.  Pt reports some lower abdominal cramping which began around 1 am.  Urine continues to be dark.  No penile discharge.

## 2023-09-27 NOTE — ED Notes (Signed)
 Patient transported to CT

## 2023-09-27 NOTE — ED Provider Notes (Signed)
 [place] EMERGENCY DEPARTMENT AT [facility] Provider Note   CSN: [id number] Arrival date & time: 09/27/23  0831     History {Add pertinent medical, surgical, social history, OB history to HPI:1} Chief Complaint  Patient presents with   Hematuria    Aaron Fry is a 33 y.o. male who presnets with cc of hematuria. He reports noticing brown/ dark discolored urine yesterday morning. He states that it lightened up in the evening however around 3 am it became dark again and he presented for evaluation. He denies frequency, urgency, dysuria, or flank pain. He denies tylenol  use, alcohol use, abdominal pain, muscle pain or injuries, fevers. He does not have any recent heavy physical exertion or heat injuries.     Hematuria       Home Medications Prior to Admission medications   Medication Sig Start Date End Date Taking? Authorizing Provider  cyclobenzaprine  (FLEXERIL ) 10 MG tablet Take 1 tablet (10 mg total) by mouth 2 (two) times daily as needed for muscle spasms. Patient not taking: Reported on 03/02/2022 10/08/21   Volney Grumbles, PA-C  HYDROcodone -acetaminophen  (NORCO/VICODIN) 5-325 MG tablet Take 1 tablet by mouth every 4 (four) hours as needed. 04/10/22   Elisa Guest, PA-C  hydrOXYzine  (ATARAX ) 25 MG tablet Take 1 tablet (25 mg total) by mouth every 8 (eight) hours as needed for anxiety. Patient not taking: Reported on 03/02/2022 09/29/21   Mozell Arias, MD  methocarbamol  (ROBAXIN ) 500 MG tablet Take 1 tablet (500 mg total) by mouth 2 (two) times daily. Patient not taking: Reported on 03/02/2022 12/03/21   Lucina Sabal, PA-C  naproxen  (NAPROSYN ) 375 MG tablet Take 1 tablet (375 mg total) by mouth 2 (two) times daily. 09/23/23   Hershel Los, MD  oxyCODONE  (OXY IR/ROXICODONE ) 5 MG immediate release tablet Take 1 tablet (5 mg total) by mouth every 4 (four) hours as needed for moderate pain. 03/02/22   Lockie Rima, MD  famotidine  (PEPCID ) 20 MG tablet Take  1 tablet (20 mg total) by mouth 2 (two) times daily. 05/30/19 03/12/20  Ballard Bongo, MD      Allergies    Patient has no known allergies.    Review of Systems   Review of Systems  Genitourinary:  Positive for hematuria.    Physical Exam Updated Vital Signs BP 123/75   Pulse 88   Temp 98.4 F (36.9 C)   Resp 16   SpO2 100%  Physical Exam Vitals and nursing note reviewed.  Constitutional:      General: He is not in acute distress.    Appearance: He is well-developed. He is not diaphoretic.  HENT:     Head: Normocephalic and atraumatic.  Eyes:     General: No scleral icterus.    Conjunctiva/sclera: Conjunctivae normal.  Cardiovascular:     Rate and Rhythm: Normal rate and regular rhythm.     Heart sounds: Normal heart sounds.  Pulmonary:     Effort: Pulmonary effort is normal. No respiratory distress.     Breath sounds: Normal breath sounds.  Abdominal:     Palpations: Abdomen is soft.     Tenderness: There is no abdominal tenderness. There is no right CVA tenderness or left CVA tenderness.  Musculoskeletal:     Cervical back: Normal range of motion and neck supple.  Skin:    General: Skin is warm and dry.  Neurological:     Mental Status: He is alert.  Psychiatric:  Behavior: Behavior normal.     ED Results / Procedures / Treatments   Labs (all labs ordered are listed, but only abnormal results are displayed) Labs Reviewed  URINALYSIS, ROUTINE W REFLEX MICROSCOPIC  COMPREHENSIVE METABOLIC PANEL WITH GFR  CBC WITH DIFFERENTIAL/PLATELET  CK    EKG None  Radiology No results found.  Procedures Procedures  {Document cardiac monitor, telemetry assessment procedure when appropriate:1}  Medications Ordered in ED Medications - No data to display  ED Course/ Medical Decision Making/ A&P   {   Click here for ABCD2, HEART and other calculatorsREFRESH Note before signing :1}                              Medical Decision Making Amount  and/or Complexity of Data Reviewed Labs: ordered. Radiology: ordered.   ***  {Document critical care time when appropriate:1} {Document review of labs and clinical decision tools ie heart score, Chads2Vasc2 etc:1}  {Document your independent review of radiology images, and any outside records:1} {Document your discussion with family members, caretakers, and with consultants:1} {Document social determinants of health affecting pt's care:1} {Document your decision making why or why not admission, treatments were needed:1} Final Clinical Impression(s) / ED Diagnoses Final diagnoses:  None    Rx / DC Orders ED Discharge Orders     None

## 2023-09-28 LAB — URINE CULTURE: Culture: NO GROWTH

## 2023-09-30 ENCOUNTER — Emergency Department (HOSPITAL_COMMUNITY)
Admission: EM | Admit: 2023-09-30 | Discharge: 2023-10-01 | Disposition: A | Discharge status: Home / Self Care | Payer: Self-pay | Attending: Emergency Medicine | Admitting: Emergency Medicine

## 2023-09-30 DIAGNOSIS — N200 Calculus of kidney: Secondary | ICD-10-CM | POA: Insufficient documentation

## 2023-10-01 ENCOUNTER — Other Ambulatory Visit: Payer: Self-pay

## 2023-10-01 ENCOUNTER — Emergency Department (HOSPITAL_COMMUNITY): Payer: Self-pay

## 2023-10-01 LAB — CBC WITH DIFFERENTIAL/PLATELET
Abs Immature Granulocytes: 0.02 10*3/uL (ref 0.00–0.07)
Basophils Absolute: 0.1 10*3/uL (ref 0.0–0.1)
Basophils Relative: 1 %
Eosinophils Absolute: 0.2 10*3/uL (ref 0.0–0.5)
Eosinophils Relative: 2 %
HCT: 44.7 % (ref 39.0–52.0)
Hemoglobin: 14.7 g/dL (ref 13.0–17.0)
Immature Granulocytes: 0 %
Lymphocytes Relative: 32 %
Lymphs Abs: 3 10*3/uL (ref 0.7–4.0)
MCH: 33 pg (ref 26.0–34.0)
MCHC: 32.9 g/dL (ref 30.0–36.0)
MCV: 100.4 fL — ABNORMAL HIGH (ref 80.0–100.0)
Monocytes Absolute: 0.7 10*3/uL (ref 0.1–1.0)
Monocytes Relative: 8 %
Neutro Abs: 5.5 10*3/uL (ref 1.7–7.7)
Neutrophils Relative %: 57 %
Platelets: 207 10*3/uL (ref 150–400)
RBC: 4.45 MIL/uL (ref 4.22–5.81)
RDW: 13 % (ref 11.5–15.5)
WBC: 9.6 10*3/uL (ref 4.0–10.5)
nRBC: 0 % (ref 0.0–0.2)

## 2023-10-01 LAB — COMPREHENSIVE METABOLIC PANEL WITH GFR
ALT: 11 U/L (ref 0–44)
AST: 14 U/L — ABNORMAL LOW (ref 15–41)
Albumin: 4.1 g/dL (ref 3.5–5.0)
Alkaline Phosphatase: 53 U/L (ref 38–126)
Anion gap: 7 (ref 5–15)
BUN: 16 mg/dL (ref 6–20)
CO2: 27 mmol/L (ref 22–32)
Calcium: 9.1 mg/dL (ref 8.9–10.3)
Chloride: 106 mmol/L (ref 98–111)
Creatinine, Ser: 0.79 mg/dL (ref 0.61–1.24)
GFR, Estimated: >60 mL/min (ref >60)
Glucose, Bld: 113 mg/dL — ABNORMAL HIGH (ref 70–99)
Potassium: 3.8 mmol/L (ref 3.5–5.1)
Sodium: 140 mmol/L (ref 135–145)
Total Bilirubin: 0.5 mg/dL (ref 0.0–1.2)
Total Protein: 7.2 g/dL (ref 6.5–8.1)

## 2023-10-01 LAB — LIPASE, BLOOD: Lipase: 35 U/L (ref 11–51)

## 2023-10-01 MED ORDER — KETOROLAC TROMETHAMINE 15 MG/ML IJ SOLN
15.0000 mg | Freq: Once | INTRAMUSCULAR | Status: AC
  Administered 2023-10-01: 15 mg via INTRAMUSCULAR
  Filled 2023-10-01: qty 1

## 2023-10-01 MED ORDER — OXYCODONE-ACETAMINOPHEN 5-325 MG PO TABS
1.0000 | ORAL_TABLET | Freq: Once | ORAL | Status: AC
  Administered 2023-10-01: 1 via ORAL
  Filled 2023-10-01: qty 1

## 2023-10-01 NOTE — ED Provider Notes (Signed)
 [place] EMERGENCY DEPARTMENT AT [facility] Provider Note   CSN: [id number] Arrival date & time: 09/30/23  2307     History  Chief Complaint  Patient presents with   Abdominal Pain    Left side abdominal pain since about 2230 tonight. No other complaints at this time. No SOB reported at this time.     Aaron Fry is a 33 y.o. male Pt complains of left-sided flank pain radiating to groin, recently diagnosed with 5 mm kidney stone 4 days ago, reports that he has been taking pain medicine sometimes but did not take it today because he was trying to go to work, he has not contacted the urologist yet about his symptoms. He reports that he felt like he was going to pass out secondary to the pain, and is concerned that the stone will not pass on its own.    Abdominal Pain      Home Medications Prior to Admission medications   Medication Sig Start Date End Date Taking? Authorizing Provider  cyclobenzaprine  (FLEXERIL ) 10 MG tablet Take 1 tablet (10 mg total) by mouth 2 (two) times daily as needed for muscle spasms. Patient not taking: Reported on 03/02/2022 10/08/21   Volney Grumbles, PA-C  HYDROcodone -acetaminophen  (NORCO/VICODIN) 5-325 MG tablet Take 1 tablet by mouth every 4 (four) hours as needed. 04/10/22   Elisa Guest, PA-C  hydrOXYzine  (ATARAX ) 25 MG tablet Take 1 tablet (25 mg total) by mouth every 8 (eight) hours as needed for anxiety. Patient not taking: Reported on 03/02/2022 09/29/21   Mozell Arias, MD  ketorolac  (TORADOL ) 10 MG tablet Take 1 tablet (10 mg total) by mouth every 6 (six) hours as needed. 09/27/23   Harris, Abigail, PA-C  methocarbamol  (ROBAXIN ) 500 MG tablet Take 1 tablet (500 mg total) by mouth 2 (two) times daily. Patient not taking: Reported on 03/02/2022 12/03/21   Lucina Sabal, PA-C  naproxen  (NAPROSYN ) 375 MG tablet Take 1 tablet (375 mg total) by mouth 2 (two) times daily. 09/23/23   Hershel Los, MD  ondansetron  (ZOFRAN ) 4 MG  tablet Take 1 tablet (4 mg total) by mouth every 8 (eight) hours as needed for nausea or vomiting. 09/27/23   Harris, Abigail, PA-C  oxyCODONE  (OXY IR/ROXICODONE ) 5 MG immediate release tablet Take 1 tablet (5 mg total) by mouth every 4 (four) hours as needed for moderate pain (pain score 4-6). 09/27/23   Harris, Abigail, PA-C  tamsulosin  (FLOMAX ) 0.4 MG CAPS capsule Take 1 capsule (0.4 mg total) by mouth 2 (two) times daily. 09/27/23   Harris, Abigail, PA-C  famotidine  (PEPCID ) 20 MG tablet Take 1 tablet (20 mg total) by mouth 2 (two) times daily. 05/30/19 03/12/20  Ballard Bongo, MD      Allergies    Patient has no known allergies.    Review of Systems   Review of Systems  Gastrointestinal:  Positive for abdominal pain.  All other systems reviewed and are negative.   Physical Exam Updated Vital Signs BP (!) 104/51 (BP Location: Left Arm)   Pulse 66   Temp 97.6 F (36.4 C) (Oral)   Resp 16   SpO2 98%  Physical Exam Vitals and nursing note reviewed.  Constitutional:      General: He is not in acute distress.    Appearance: Normal appearance.  HENT:     Head: Normocephalic and atraumatic.  Eyes:     General:        Right eye: No discharge.  Left eye: No discharge.  Cardiovascular:     Rate and Rhythm: Normal rate and regular rhythm.     Heart sounds: No murmur heard.    No friction rub. No gallop.  Pulmonary:     Effort: Pulmonary effort is normal.     Breath sounds: Normal breath sounds.  Abdominal:     General: Bowel sounds are normal.     Palpations: Abdomen is soft.     Comments: Focal tenderness to palpation in the left flank, positive CVA tenderness, mild suprapubic tenderness on the left.  No significant right-sided tenderness, no McBurney's point tenderness, no rebound, rigidity, guarding or abdominal distention throughout  Skin:    General: Skin is warm and dry.     Capillary Refill: Capillary refill takes less than 2 seconds.  Neurological:      Mental Status: He is alert and oriented to person, place, and time.  Psychiatric:        Mood and Affect: Mood normal.        Behavior: Behavior normal.     ED Results / Procedures / Treatments   Labs (all labs ordered are listed, but only abnormal results are displayed) Labs Reviewed  COMPREHENSIVE METABOLIC PANEL WITH GFR - Abnormal; Notable for the following components:      Result Value   Glucose, Bld 113 (*)    AST 14 (*)    All other components within normal limits  CBC WITH DIFFERENTIAL/PLATELET - Abnormal; Notable for the following components:   MCV 100.4 (*)    All other components within normal limits  LIPASE, BLOOD  URINALYSIS, ROUTINE W REFLEX MICROSCOPIC    EKG None  Radiology CT Renal Stone Study Result Date: 10/01/2023 CLINICAL DATA:  Left-sided abdominal pain. EXAM: CT ABDOMEN AND PELVIS WITHOUT CONTRAST TECHNIQUE: Multidetector CT imaging of the abdomen and pelvis was performed following the standard protocol without IV contrast. RADIATION DOSE REDUCTION: This exam was performed according to the departmental dose-optimization program which includes automated exposure control, adjustment of the mA and/or kV according to patient size and/or use of iterative reconstruction technique. COMPARISON:  September 27, 2023 FINDINGS: Lower chest: No acute abnormality. Hepatobiliary: No focal liver abnormality is seen. No gallstones, gallbladder wall thickening, or biliary dilatation. Pancreas: Unremarkable. No pancreatic ductal dilatation or surrounding inflammatory changes. Spleen: Normal in size without focal abnormality. Adrenals/Urinary Tract: Chronic calcification is seen within the right adrenal gland. The left adrenal gland is unremarkable. Kidneys are normal in size, without focal lesions. Punctate nonobstructing renal calculi are seen within the lower pole of the left kidney (coronal reformatted image 53, CT series 7). A 5 mm obstructing renal calculus is seen within the  proximal left ureter, with mild left-sided hydronephrosis and hydroureter. Bladder is unremarkable. Stomach/Bowel: Stomach is within normal limits. Appendix appears normal. No evidence of bowel wall thickening, distention, or inflammatory changes. Vascular/Lymphatic: No significant vascular findings are present. No enlarged abdominal or pelvic lymph nodes. Reproductive: Prostate is unremarkable. Other: No abdominal wall hernia or abnormality. No abdominopelvic ascites. Musculoskeletal: No acute or significant osseous findings. IMPRESSION: 1. 5 mm obstructing renal calculus within the proximal left ureter. 2. Punctate nonobstructing left renal calculi. Electronically Signed   By: Virgle Grime M.D.   On: 10/01/2023 02:02    Procedures Procedures    Medications Ordered in ED Medications  oxyCODONE -acetaminophen  (PERCOCET/ROXICET) 5-325 MG per tablet 1 tablet (1 tablet Oral Given 10/01/23 0155)  ketorolac  (TORADOL ) 15 MG/ML injection 15 mg (15 mg Intramuscular Given 10/01/23 0256)  ED Course/ Medical Decision Making/ A&P                                 Medical Decision Making Amount and/or Complexity of Data Reviewed Labs: ordered. Radiology: ordered.  Risk Prescription drug management.   This patient is a 33 y.o. male  who presents to the ED for concern of ongoing left-sided flank pain, with recent known diagnosis of 5 mm kidney stone.   Differential diagnoses prior to evaluation: The emergent differential diagnosis includes, but is not limited to,  AAA, renal vascular thrombosis, mesenteric ischemia, pyelonephritis, nephrolithiasis, cystitis, biliary colic, pancreatitis, PUD, appendicitis, diverticulitis, bowel obstruction . This is not an exhaustive differential.   Past Medical History / Co-morbidities / Social History: Overall noncontributory  Additional history: Chart reviewed. Pertinent results include: I reviewed the lab work, imaging from his recent previous emergency  department visit for same obstructing stone on the left just 4 days ago.  Physical Exam: Physical exam performed. The pertinent findings include: Focal tenderness to palpation in the left flank, positive CVA tenderness, mild suprapubic tenderness on the left.  No significant right-sided tenderness, no McBurney's point tenderness, no rebound, rigidity, guarding or abdominal distention throughout   Vital signs overall stable in the emergency department, no tachycardia, no fever.  Lab Tests/Imaging studies: I personally interpreted labs/imaging and the pertinent results include: CBC unremarkable, CMP overall unremarkable, notably with normal creatinine, lipase is unremarkable at 35.  Patient denying any dysuria and was just evaluated a few days ago, he has no clinical signs symptoms of developing infection, so I think reasonable to forego additional urine sample at this time as he has not provided us  1 in the emergency department.  I independently interpreted CT renal stone study which shows 5 mm obstructing stone on the left noted previously. I agree with the radiologist interpretation.   Medications: I ordered medication including pain improved after Percocet, Toradol .  I have reviewed the patients home medicines and have made adjustments as needed.   Disposition: After consideration of the diagnostic results and the patients response to treatment, I feel that patient with ongoing kidney stone, no evidence of infection, or worsening kidney function, discussed outpatient pain control and encourage close urology follow-up if he continues to not pass the stone despite treatment.  He reports that he did not pick up some of the medications that we prescribed a few days ago because he could not afford them at the time but he reports that he has not been paid, discussed the importance of taking the Flomax , Toradol  if he wants to continue working, and pain medicine for severe breakthrough pain.Aaron Aas   emergency  department workup does not suggest an emergent condition requiring admission or immediate intervention beyond what has been performed at this time. The plan is: as above. The patient is safe for discharge and has been instructed to return immediately for worsening symptoms, change in symptoms or any other concerns.  Final Clinical Impression(s) / ED Diagnoses Final diagnoses:  Kidney stone    Rx / DC Orders ED Discharge Orders     None         Stefan Edge 10/01/23 5284    Lindle Rhea, MD 10/01/23 [phone]

## 2023-10-01 NOTE — ED Provider Triage Note (Signed)
 Emergency Medicine Provider Triage Evaluation Note  Aaron Fry , a 33 y.o. male  was evaluated in triage.  Pt complains of left-sided flank pain radiating to groin, recently diagnosed with 5 mm kidney stone 4 days ago, reports that he has been taking pain medicine sometimes but did not take it today because he was trying to go to work, he has not contacted the urologist yet about his symptoms.  He reports that he felt like he was going to pass out secondary to the pain, and is concerned that the stone will not pass on its own.  Review of Systems  Positive: Flank pain, nausea Negative:   Physical Exam  BP 116/79 (BP Location: Left Arm)   Pulse (!) 53   Temp 97.9 F (36.6 C) (Oral)   Resp 14   SpO2 100%  Gen:   Awake, no distress   Resp:  Normal effort  MSK:   Moves extremities without difficulty  Other:  Focal ttp in left suprapubic region, radiating to left flank, no rebound, rigidity, guarding  Medical Decision Making  Medically screening exam initiated at 1:30 AM.  Appropriate orders placed.  Aaron Fry was informed that the remainder of the evaluation will be completed by another provider, this initial triage assessment does not replace that evaluation, and the importance of remaining in the ED until their evaluation is complete.  Workup initiated in triage    Aaron Fry, [place] 10/01/23 0132

## 2023-10-01 NOTE — ED Notes (Signed)
 Patient transported to CT, aware to return pt to room 3 when complete

## 2023-10-01 NOTE — Discharge Instructions (Signed)
 As we discussed you still have an obstructing stone on the left, however it is a size that it will likely pass on its own, continue taking the medications that you were prescribed for pain and to help ease the passage of the stone just a few days ago.  If you need to take nonnarcotic pain medications that you can work you can take the Toradol  that I prescribed.  I would call the urologist tomorrow morning to schedule a follow-up appointment in case the stone continues did not pass despite all of the above.  Please return if you have severe fever, chills, burning with urination, or worsening pain despite treatment.

## 2023-10-23 ENCOUNTER — Emergency Department: Payer: MEDICAID

## 2023-10-23 ENCOUNTER — Emergency Department
Admission: EM | Admit: 2023-10-23 | Discharge: 2023-10-23 | Disposition: A | Discharge status: Home / Self Care | Payer: MEDICAID | Attending: Emergency Medicine | Admitting: Emergency Medicine

## 2023-10-23 ENCOUNTER — Other Ambulatory Visit: Payer: Self-pay

## 2023-10-23 DIAGNOSIS — R109 Unspecified abdominal pain: Secondary | ICD-10-CM | POA: Diagnosis present

## 2023-10-23 DIAGNOSIS — N132 Hydronephrosis with renal and ureteral calculous obstruction: Secondary | ICD-10-CM | POA: Insufficient documentation

## 2023-10-23 DIAGNOSIS — N200 Calculus of kidney: Secondary | ICD-10-CM

## 2023-10-23 HISTORY — DX: Calculus of kidney: N20.0

## 2023-10-23 LAB — URINALYSIS, ROUTINE W REFLEX MICROSCOPIC
Bacteria, UA: NONE SEEN
Bilirubin Urine: NEGATIVE
Glucose, UA: NEGATIVE mg/dL
Ketones, ur: NEGATIVE mg/dL
Leukocytes,Ua: NEGATIVE
Nitrite: NEGATIVE
Protein, ur: 30 mg/dL — AB
RBC / HPF: >50 RBC/hpf (ref 0–5)
Specific Gravity, Urine: 1.023 (ref 1.005–1.030)
pH: 5 (ref 5.0–8.0)

## 2023-10-23 LAB — BASIC METABOLIC PANEL WITH GFR
Anion gap: 11 (ref 5–15)
BUN: 14 mg/dL (ref 6–20)
CO2: 25 mmol/L (ref 22–32)
Calcium: 9.3 mg/dL (ref 8.9–10.3)
Chloride: 101 mmol/L (ref 98–111)
Creatinine, Ser: 0.78 mg/dL (ref 0.61–1.24)
GFR, Estimated: >60 mL/min (ref >60)
Glucose, Bld: 114 mg/dL — ABNORMAL HIGH (ref 70–99)
Potassium: 3.6 mmol/L (ref 3.5–5.1)
Sodium: 137 mmol/L (ref 135–145)

## 2023-10-23 LAB — CBC
HCT: 42.1 % (ref 39.0–52.0)
Hemoglobin: 14.4 g/dL (ref 13.0–17.0)
MCH: 33 pg (ref 26.0–34.0)
MCHC: 34.2 g/dL (ref 30.0–36.0)
MCV: 96.6 fL (ref 80.0–100.0)
Platelets: 236 10*3/uL (ref 150–400)
RBC: 4.36 MIL/uL (ref 4.22–5.81)
RDW: 12.7 % (ref 11.5–15.5)
WBC: 9.7 10*3/uL (ref 4.0–10.5)
nRBC: 0 % (ref 0.0–0.2)

## 2023-10-23 MED ORDER — OXYCODONE HCL 5 MG PO TABS: 5.0000 mg | ORAL_TABLET | Freq: Four times a day (QID) | ORAL | 0 refills | Status: DC | PRN

## 2023-10-23 MED ORDER — ONDANSETRON 4 MG PO TBDP: 4.0000 mg | ORAL_TABLET | Freq: Four times a day (QID) | ORAL | 0 refills | Status: DC | PRN

## 2023-10-23 MED ORDER — KETOROLAC TROMETHAMINE 30 MG/ML IJ SOLN
30.0000 mg | Freq: Once | INTRAMUSCULAR | Status: AC
  Administered 2023-10-23: 30 mg via INTRAMUSCULAR
  Filled 2023-10-23: qty 1

## 2023-10-23 MED ORDER — ONDANSETRON 4 MG PO TBDP
4.0000 mg | ORAL_TABLET | Freq: Once | ORAL | Status: AC
  Administered 2023-10-23: 4 mg via ORAL
  Filled 2023-10-23: qty 1

## 2023-10-23 MED ORDER — TAMSULOSIN HCL 0.4 MG PO CAPS: 0.4000 mg | ORAL_CAPSULE | Freq: Every day | ORAL | 0 refills | Status: DC

## 2023-10-23 NOTE — ED Triage Notes (Signed)
 Patient states left flank pain and blood in urine, history of kidney stones.

## 2023-10-23 NOTE — ED Provider Notes (Signed)
 [facility] Provider Note    Event Date/Time   First MD Initiated Contact with Patient 10/23/23 1817     (approximate)   History   Flank Pain   HPI  Aaron Fry is a 33 y.o. male with a history of nephrolithiasis  Patient reports that he started having intense left flank pain earlier today.  He identifies it to be the same pain and symptoms he had about a month or 2 ago where he was diagnosed with a kidney stone on the left.  He was believed to have passed that but believes he has additional stones  He did drive himself here from work.  Denies any fevers chills nausea or vomiting but has noticed intermittently small amounts of blood in his urine.  Reports pain currently sharp located in the left flank no testicular pain.     Physical Exam   Triage Vital Signs: ED Triage Vitals  Encounter Vitals Group     BP 10/23/23 1717 130/79     Systolic BP Percentile --      Diastolic BP Percentile --      Pulse Rate 10/23/23 1716 72     Resp 10/23/23 1716 18     Temp 10/23/23 1716 98.7 F (37.1 C)     Temp Source 10/23/23 1716 Oral     SpO2 10/23/23 1716 98 %     Weight --      Height --      Head Circumference --      Peak Flow --      Pain Score 10/23/23 1718 10     Pain Loc --      Pain Education --      Exclude from Growth Chart --     Most recent vital signs: Vitals:   10/23/23 1716 10/23/23 1717  BP:  130/79  Pulse: 72   Resp: 18   Temp: 98.7 F (37.1 C)   SpO2: 98%      General: Awake, no distress.  Appears in some discomfort, reporting left flank pain CV:  Good peripheral perfusion.  Resp:  Normal effort.  Clear Abd:  No distention.  Mild reproducible pain to percussion with left costovertebral angle.  None noted on the right.  Abdomen soft nontender nondistended. Other:  Fully alert and oriented.  No right upper quadrant pain.  No right lower quadrant pain no pain McBurney's point no rebound or guarding   ED Results /  Procedures / Treatments   Labs (all labs ordered are listed, but only abnormal results are displayed) Labs Reviewed  URINALYSIS, ROUTINE W REFLEX MICROSCOPIC - Abnormal; Notable for the following components:      Result Value   Color, Urine YELLOW (*)    APPearance CLOUDY (*)    Hgb urine dipstick LARGE (*)    Protein, ur 30 (*)    All other components within normal limits  BASIC METABOLIC PANEL WITH GFR - Abnormal; Notable for the following components:   Glucose, Bld 114 (*)    All other components within normal limits  CBC     EKG     RADIOLOGY  Reviewed previous CT scan, I do not think a repeat CT is necessary today.  He has a known history of nephrolithiasis with relatively small stone, last one 5 mm.       PROCEDURES:  Critical Care performed: No  Procedures   MEDICATIONS ORDERED IN ED: Medications  ketorolac  (TORADOL ) 30 MG/ML injection 30  mg (30 mg Intramuscular Given 10/23/23 1932)  ondansetron  (ZOFRAN -ODT) disintegrating tablet 4 mg (4 mg Oral Given 10/23/23 1932)     IMPRESSION / MDM / ASSESSMENT AND PLAN / ED COURSE  I reviewed the triage vital signs and the nursing notes.                              Differential diagnosis includes but is not limited to, abdominal perforation, aortic dissection, cholecystitis, appendicitis, diverticulitis, colitis, esophagitis/gastritis, kidney stone, pyelonephritis, urinary tract infection, aortic aneurysm. All are considered in decision and treatment plan. Based upon the patient's presentation and risk factors, I suspicious for acute nephrolithiasis.    Labs interpreted as preserved renal function.  Normal white count.  Urinalysis with hematuria but no obvious bacteria.  Based on the patient's clinical history symptoms white blood cell count afebrile status and urinalysis I doubt associated infection at this time  Patient's presentation is most consistent with acute complicated illness / injury requiring  diagnostic workup.   The patient's clinical history is most consistent with recurrent nephrolithiasis with reassuring abdominal exam, a history of known left-sided nephrolithiasis presenting with similar symptoms today.  The patient advises that he drove himself here and does not have and also drive himself home, he would like to trial pain medication such as Toradol  but no sedating medication while in the ER in the bed he may drive himself home.  I think this is quite reasonable.  He has previously used oxycodone  at home for treatment of pain during episodes of nephrolithiasis   Clinical Course as of 10/23/23 2040  Thu Oct 23, 2023  2026 Patient resting comfortably.  Pain well-controlled.  I have paged Dr. Estanislao Heimlich for recommendations on forward moving treatment [MQ]  2034 Dr. Estanislao Heimlich has reviewed ultrasound and x-ray. He recommends close follow-up and will have patient follow-up early next week in office. Flomax , pain control, close follow-up [MQ]    Clinical Course User Index [MQ] Iver Marker, MD   ----------------------------------------- 8:40 PM on 10/23/2023 -----------------------------------------  I will prescribe the patient a narcotic pain medicine due to their condition which I anticipate will cause at least moderate pain short term. I discussed with the patient safe use of narcotic pain medicines, and that they are not to drive, work in dangerous areas, or ever take more than prescribed (no more than 1 pill every 6 hours). We discussed that this is the type of medication that can be  overdosed on and the risks of this type of medicine. Patient is very agreeable to only use as prescribed and to never use more than prescribed.  Return precautions and treatment recommendations and follow-up discussed with the patient who is agreeable with the plan.   FINAL CLINICAL IMPRESSION(S) / ED DIAGNOSES   Final diagnoses:  Nephrolithiasis     Rx / DC Orders   ED Discharge Orders           Ordered    oxyCODONE  (OXY IR/ROXICODONE ) 5 MG immediate release tablet  Every 6 hours PRN        10/23/23 2039    ondansetron  (ZOFRAN -ODT) 4 MG disintegrating tablet  Every 6 hours PRN        10/23/23 2039    tamsulosin  (FLOMAX ) 0.4 MG CAPS capsule  Daily        10/23/23 2039             Note:  This document was prepared using Dragon  voice recognition software and may include unintentional dictation errors.   Iver Marker, MD 10/23/23 2040

## 2023-10-23 NOTE — Discharge Instructions (Addendum)
 No driving or operating heavy machinery or being in dangerous areas within 8 hours of use of oxycodone .  Use only as prescribed.

## 2023-10-23 NOTE — ED Notes (Signed)
 [place] at bedside

## 2023-10-27 ENCOUNTER — Telehealth: Payer: Self-pay | Admitting: Urology

## 2023-10-27 NOTE — Telephone Encounter (Signed)
Call cannot be completed as dialed. Incorrect phone number

## 2023-11-08 ENCOUNTER — Emergency Department (HOSPITAL_COMMUNITY)
Admission: EM | Admit: 2023-11-08 | Discharge: 2023-11-09 | Disposition: A | Discharge status: Home / Self Care | Payer: MEDICAID | Attending: Emergency Medicine | Admitting: Emergency Medicine

## 2023-11-08 ENCOUNTER — Emergency Department (HOSPITAL_COMMUNITY): Payer: MEDICAID

## 2023-11-08 DIAGNOSIS — M546 Pain in thoracic spine: Secondary | ICD-10-CM | POA: Insufficient documentation

## 2023-11-08 DIAGNOSIS — Y92410 Unspecified street and highway as the place of occurrence of the external cause: Secondary | ICD-10-CM | POA: Insufficient documentation

## 2023-11-08 DIAGNOSIS — M545 Low back pain, unspecified: Secondary | ICD-10-CM | POA: Diagnosis not present

## 2023-11-08 DIAGNOSIS — D72829 Elevated white blood cell count, unspecified: Secondary | ICD-10-CM | POA: Insufficient documentation

## 2023-11-08 DIAGNOSIS — S82831A Other fracture of upper and lower end of right fibula, initial encounter for closed fracture: Secondary | ICD-10-CM | POA: Diagnosis not present

## 2023-11-08 DIAGNOSIS — M25571 Pain in right ankle and joints of right foot: Secondary | ICD-10-CM | POA: Diagnosis present

## 2023-11-08 LAB — I-STAT CHEM 8, ED
BUN: 11 mg/dL (ref 6–20)
Calcium, Ion: 1 mmol/L — ABNORMAL LOW (ref 1.15–1.40)
Chloride: 102 mmol/L (ref 98–111)
Creatinine, Ser: 0.8 mg/dL (ref 0.61–1.24)
Glucose, Bld: 106 mg/dL — ABNORMAL HIGH (ref 70–99)
HCT: 44 % (ref 39.0–52.0)
Hemoglobin: 15 g/dL (ref 13.0–17.0)
Potassium: 4.2 mmol/L (ref 3.5–5.1)
Sodium: 138 mmol/L (ref 135–145)
TCO2: 29 mmol/L (ref 22–32)

## 2023-11-08 LAB — BASIC METABOLIC PANEL WITH GFR
Anion gap: 12 (ref 5–15)
BUN: 10 mg/dL (ref 6–20)
CO2: 23 mmol/L (ref 22–32)
Calcium: 8.6 mg/dL — ABNORMAL LOW (ref 8.9–10.3)
Chloride: 103 mmol/L (ref 98–111)
Creatinine, Ser: 0.76 mg/dL (ref 0.61–1.24)
GFR, Estimated: >60 mL/min (ref >60)
Glucose, Bld: 105 mg/dL — ABNORMAL HIGH (ref 70–99)
Potassium: 3.3 mmol/L — ABNORMAL LOW (ref 3.5–5.1)
Sodium: 138 mmol/L (ref 135–145)

## 2023-11-08 LAB — CBC
HCT: 44 % (ref 39.0–52.0)
Hemoglobin: 14.8 g/dL (ref 13.0–17.0)
MCH: 32.8 pg (ref 26.0–34.0)
MCHC: 33.6 g/dL (ref 30.0–36.0)
MCV: 97.6 fL (ref 80.0–100.0)
Platelets: 239 10*3/uL (ref 150–400)
RBC: 4.51 MIL/uL (ref 4.22–5.81)
RDW: 12.8 % (ref 11.5–15.5)
WBC: 16.3 10*3/uL — ABNORMAL HIGH (ref 4.0–10.5)
nRBC: 0 % (ref 0.0–0.2)

## 2023-11-08 MED ORDER — FENTANYL CITRATE PF 50 MCG/ML IJ SOSY
50.0000 ug | PREFILLED_SYRINGE | Freq: Once | INTRAMUSCULAR | Status: AC
  Administered 2023-11-09: 50 ug via INTRAVENOUS
  Filled 2023-11-08: qty 1
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
## 2023-11-09 ENCOUNTER — Emergency Department (HOSPITAL_COMMUNITY): Payer: MEDICAID

## 2023-11-09 MED ORDER — OXYCODONE HCL 5 MG PO TABS
5.0000 mg | ORAL_TABLET | Freq: Once | ORAL | Status: AC
  Administered 2023-11-09: 5 mg via ORAL
  Filled 2023-11-09: qty 1

## 2023-11-09 MED ORDER — HYDROCODONE-ACETAMINOPHEN 5-325 MG PO TABS: 1.0000 | ORAL_TABLET | Freq: Four times a day (QID) | ORAL | 0 refills | Status: DC | PRN

## 2023-11-09 MED ORDER — POTASSIUM CHLORIDE CRYS ER 20 MEQ PO TBCR
40.0000 meq | EXTENDED_RELEASE_TABLET | Freq: Once | ORAL | Status: AC
  Administered 2023-11-09: 40 meq via ORAL
  Filled 2023-11-09: qty 2

## 2023-11-09 MED ORDER — IOHEXOL 350 MG/ML SOLN
75.0000 mL | Freq: Once | INTRAVENOUS | Status: AC | PRN
  Administered 2023-11-09: 75 mL via INTRAVENOUS

## 2023-11-09 NOTE — Discharge Instructions (Addendum)
 It was a pleasure taking part in your care.  As discussed, you have a fracture in your right ankle.  Please remain in the splint until evaluated by Dr. Jackee Marus.  Please utilize crutches to ambulate.  Please take either ibuprofen  600 mg or Tylenol  650 mg every 6 hours as needed for pain.  If this does not relieve pain, please proceed to hydrocodone  pain medication 1 tablet every 6 hours.  Please do not mix this with alcohol, drive or operate machinery while take this medication.  To shower, please place Saran wrap over your splint or a plastic bag in order to keep it dry.  Please call Dr. Wallace Gully office on Monday morning make an appointment to be seen.  Return to the ED with any new or worsening symptoms.

## 2023-11-09 NOTE — Progress Notes (Signed)
 Orthopedic Tech Progress Note Patient Details:  Aaron Fry Nov 03, 1990 [id number]  Ortho Devices Type of Ortho Device: Crutches, Post (short leg) splint, Stirrup splint Ortho Device/Splint Location: rle Ortho Device/Splint Interventions: Ordered, Adjustment, Application  I applied the splint with assist from the rn to hold the leg for splint application. Then the pt got up and walked with the crutches good. Post Interventions Patient Tolerated: Well Instructions Provided: Care of device, Adjustment of device  [name] 11/09/2023, 3:32 AM
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
## 2023-11-12 ENCOUNTER — Other Ambulatory Visit: Payer: Self-pay | Admitting: Orthopaedic Surgery

## 2023-11-13 ENCOUNTER — Ambulatory Visit
Admission: RE | Admit: 2023-11-13 | Discharge: 2023-11-13 | Disposition: A | Discharge status: Home / Self Care | Source: Ambulatory Visit | Attending: Orthopaedic Surgery

## 2023-11-13 ENCOUNTER — Other Ambulatory Visit: Payer: Self-pay | Admitting: Orthopaedic Surgery

## 2023-11-13 ENCOUNTER — Encounter (HOSPITAL_COMMUNITY): Payer: Self-pay

## 2023-11-13 DIAGNOSIS — S82871A Displaced pilon fracture of right tibia, initial encounter for closed fracture: Secondary | ICD-10-CM

## 2023-11-13 NOTE — Patient Instructions (Addendum)
 SURGICAL WAITING ROOM VISITATION Patients having surgery or a procedure may have no more than 2 support people in the waiting area - these visitors may rotate.    Children under the age of 61 must have an adult with them who is not the patient.  If the patient needs to stay at the hospital during part of their recovery, the visitor guidelines for inpatient rooms apply. Pre-op nurse will coordinate an appropriate time for 1 support person to accompany patient in pre-op.  This support person may not rotate.    Please refer to the [facility] website for the visitor guidelines for Inpatients (after your surgery is over and you are in a regular room).       Your procedure is scheduled on: 11-18-23   Report to [facility] Main Entrance    Report to admitting at   9:00 AM   Call this number if you have problems the morning of surgery [phone]   Do not eat food :After Midnight.   After Midnight you may have the following liquids until 8:15 AM DAY OF SURGERY  Water Non-Citrus Juices (without pulp, NO RED-Apple, White grape, White cranberry) Black Coffee (NO MILK/CREAM OR CREAMERS, sugar ok)  Clear Tea (NO MILK/CREAM OR CREAMERS, sugar ok) regular and decaf                             Plain Jell-O (NO RED)                                           Fruit ices (not with fruit pulp, NO RED)                                     Popsicles (NO RED)                                                               Sports drinks like Gatorade (NO RED)                   The day of surgery:  Drink ONE (1) Pre-Surgery Clear Ensure by 8:15 AM the morning of surgery. Drink in one sitting. Do not sip.  This drink was given to you during your hospital  pre-op appointment visit. Nothing else to drink after completing the Pre-Surgery Clear Ensure.          If you have questions, please contact your surgeon's office.   FOLLOW  ANY ADDITIONAL PRE OP INSTRUCTIONS YOU RECEIVED FROM YOUR  SURGEON'S OFFICE!!!     Oral Hygiene is also important to reduce your risk of infection.                                    Remember - BRUSH YOUR TEETH THE MORNING OF SURGERY WITH YOUR REGULAR TOOTHPASTE   Do NOT smoke after Midnight   Take these medicines the morning of surgery with A SIP OF WATER:    Hydrocodone  if  needed  Stop all vitamins and herbal supplements 7 days before surgery                              You may not have any metal on your body including jewelry, and body piercing             Do not wear  lotions, powders, cologne, or deodorant              Men may shave face and neck.   Do not bring valuables to the hospital. [facility] IS NOT RESPONSIBLE   FOR VALUABLES.   Contacts, dentures or bridgework may not be worn into surgery.  DO NOT BRING YOUR HOME MEDICATIONS TO THE HOSPITAL. PHARMACY WILL DISPENSE MEDICATIONS LISTED ON YOUR MEDICATION LIST TO YOU DURING YOUR ADMISSION IN THE HOSPITAL!    Patients discharged on the day of surgery will not be allowed to drive home.  Someone NEEDS to stay with you for the first 24 hours after anesthesia.   Special Instructions: Bring a copy of your healthcare power of attorney and living will documents the day of surgery if you haven't scanned them before.              Please read over the following fact sheets you were given: IF YOU HAVE QUESTIONS ABOUT YOUR PRE-OP INSTRUCTIONS PLEASE CALL [phone] Aaron Fry  If you received a COVID test during your pre-op visit  it is requested that you wear a mask when out in public, stay away from anyone that may not be feeling well and notify your surgeon if you develop symptoms. If you test positive for Covid or have been in contact with anyone that has tested positive in the last 10 days please notify you surgeon.  [place] - Preparing for Surgery Before surgery, you can play an important role.  Because skin is not sterile, your skin needs to be as free of germs as possible.  You can  reduce the number of germs on your skin by washing with CHG (chlorahexidine gluconate) soap before surgery.  CHG is an antiseptic cleaner which kills germs and bonds with the skin to continue killing germs even after washing. Please DO NOT use if you have an allergy to CHG or antibacterial soaps.  If your skin becomes reddened/irritated stop using the CHG and inform your nurse when you arrive at Short Stay. Do not shave (including legs and underarms) for at least 48 hours prior to the first CHG shower.  You may shave your face/neck.  Please follow these instructions carefully:  1.  Shower with CHG Soap the night before surgery and the  morning of surgery.  2.  If you choose to wash your hair, wash your hair first as usual with your normal  shampoo.  3.  After you shampoo, rinse your hair and body thoroughly to remove the shampoo.                             4.  Use CHG as you would any other liquid soap.  You can apply chg directly to the skin and wash.  Gently with a scrungie or clean washcloth.  5.  Apply the CHG Soap to your body ONLY FROM THE NECK DOWN.   Do   not use on face/ open  Wound or open sores. Avoid contact with eyes, ears mouth and   genitals (private parts).                       Wash face,  Genitals (private parts) with your normal soap.             6.  Wash thoroughly, paying special attention to the area where your    surgery  will be performed.  7.  Thoroughly rinse your body with warm water from the neck down.  8.  DO NOT shower/wash with your normal soap after using and rinsing off the CHG Soap.                9.  Pat yourself dry with a clean towel.            10.  Wear clean pajamas.            11.  Place clean sheets on your bed the night of your first shower and do not  sleep with pets. Day of Surgery : Do not apply any lotions/deodorants the morning of surgery.  Please wear clean clothes to the hospital/surgery center.  FAILURE TO FOLLOW THESE  INSTRUCTIONS MAY RESULT IN THE CANCELLATION OF YOUR SURGERY  PATIENT SIGNATURE_________________________________  NURSE SIGNATURE__________________________________  ________________________________________________________________________    Aaron Fry  An incentive spirometer is a tool that can help keep your lungs clear and active. This tool measures how well you are filling your lungs with each breath. Taking long deep breaths may help reverse or decrease the chance of developing breathing (pulmonary) problems (especially infection) following: A long period of time when you are unable to move or be active. BEFORE THE PROCEDURE  If the spirometer includes an indicator to show your best effort, your nurse or respiratory therapist will set it to a desired goal. If possible, sit up straight or lean slightly forward. Try not to slouch. Hold the incentive spirometer in an upright position. INSTRUCTIONS FOR USE  Sit on the edge of your bed if possible, or sit up as far as you can in bed or on a chair. Hold the incentive spirometer in an upright position. Breathe out normally. Place the mouthpiece in your mouth and seal your lips tightly around it. Breathe in slowly and as deeply as possible, raising the piston or the ball toward the top of the column. Hold your breath for 3-5 seconds or for as long as possible. Allow the piston or ball to fall to the bottom of the column. Remove the mouthpiece from your mouth and breathe out normally. Rest for a few seconds and repeat Steps 1 through 7 at least 10 times every 1-2 hours when you are awake. Take your time and take a few normal breaths between deep breaths. The spirometer may include an indicator to show your best effort. Use the indicator as a goal to work toward during each repetition. After each set of 10 deep breaths, practice coughing to be sure your lungs are clear. If you have an incision (the cut made at the time of surgery),  support your incision when coughing by placing a pillow or rolled up towels firmly against it. Once you are able to get out of bed, walk around indoors and cough well. You may stop using the incentive spirometer when instructed by your caregiver.  RISKS AND COMPLICATIONS Take your time so you do not get dizzy or light-headed. If you are in pain,  you may need to take or ask for pain medication before doing incentive spirometry. It is harder to take a deep breath if you are having pain. AFTER USE Rest and breathe slowly and easily. It can be helpful to keep track of a log of your progress. Your caregiver can provide you with a simple table to help with this. If you are using the spirometer at home, follow these instructions: SEEK MEDICAL CARE IF:  You are having difficultly using the spirometer. You have trouble using the spirometer as often as instructed. Your pain medication is not giving enough relief while using the spirometer. You develop fever of 100.5 F (38.1 C) or higher. SEEK IMMEDIATE MEDICAL CARE IF:  You cough up bloody sputum that had not been present before. You develop fever of 102 F (38.9 C) or greater. You develop worsening pain at or near the incision site. MAKE SURE YOU:  Understand these instructions. Will watch your condition. Will get help right away if you are not doing well or get worse. Document Released: 10/07/2006 Document Revised: 08/19/2011 Document Reviewed: 12/08/2006 [facility] Patient Information 2014 [place], [place].   ________________________________________________________________________

## 2023-11-16 NOTE — Progress Notes (Signed)
 COVID Vaccine received:  []  No [x]  Yes Date of any COVID positive Test in last 53 days:None  PCP - none Cardiologist - none  Chest x-ray -  EKG -   Stress Test -  ECHO -  Cardiac Cath -  CT Coronary Calcium score:   Pacemaker / ICD device [x]  No []  Yes   Spinal Cord Stimulator:[x]  No []  Yes       History of Sleep Apnea? [x]  No []  Yes   CPAP used?- [x]  No []  Yes    Does the patient monitor blood sugar?   [x]  N/A   []  No []  Yes  Patient has: [x]  NO Hx DM   []  Pre-DM   []  DM1  []   DM2  Blood Thinner / Instructions:  none Aspirin Instructions:  none  ERAS Protocol Ordered: []  No  [x]  Yes PRE-SURGERY [x]  ENSURE  []  G2    Patient is to be NPO after: 0815  Dental hx: []  Dentures:  [x]  N/A      []  Bridge or Partial:                   []  Loose or Damaged teeth:   Activity level: Able to walk up 2 flights of stairs without becoming significantly short of breath or having chest pain?  [x]  No   []    Yes  has fx right ankle on crutches, says he would have SOB not CP  Anesthesia review: smokes 1/4 ppd, no PCP, no pertinent hx  Patient denies shortness of breath, fever, cough and chest pain at PAT appointment.  Patient verbalized understanding and agreement to the Pre-Surgical Instructions that were given to them at this PAT appointment. Patient was also educated of the need to review these PAT instructions again prior to his surgery.I reviewed the appropriate phone numbers to call if they have any and questions or concerns.

## 2023-11-17 ENCOUNTER — Encounter (HOSPITAL_COMMUNITY)
Admission: RE | Admit: 2023-11-17 | Discharge: 2023-11-17 | Disposition: A | Discharge status: Home / Self Care | Source: Ambulatory Visit | Attending: Orthopaedic Surgery

## 2023-11-17 ENCOUNTER — Other Ambulatory Visit: Payer: Self-pay

## 2023-11-17 ENCOUNTER — Encounter (HOSPITAL_COMMUNITY): Payer: Self-pay

## 2023-11-17 VITALS — BP 114/76 | HR 64 | Temp 98.6°F | Resp 12 | Ht 69.0 in | Wt 140.0 lb

## 2023-11-17 DIAGNOSIS — Z01812 Encounter for preprocedural laboratory examination: Secondary | ICD-10-CM | POA: Insufficient documentation

## 2023-11-17 DIAGNOSIS — Z01818 Encounter for other preprocedural examination: Secondary | ICD-10-CM

## 2023-11-17 HISTORY — DX: Personal history of urinary calculi: Z87.442

## 2023-11-17 LAB — SURGICAL PCR SCREEN
MRSA, PCR: NEGATIVE
Staphylococcus aureus: NEGATIVE

## 2023-11-17 NOTE — Anesthesia Preprocedure Evaluation (Signed)
 Anesthesia Evaluation  Patient identified by MRN, date of birth, ID band Patient awake    Reviewed: Allergy & Precautions, NPO status , Patient's Chart, lab work & pertinent test results  Airway Mallampati: II  TM Distance: >3 FB Neck ROM: Full    Dental no notable dental hx. (+) Teeth Intact, Dental Advisory Given   Pulmonary Current Smoker   Pulmonary exam normal breath sounds clear to auscultation       Cardiovascular negative cardio ROS Normal cardiovascular exam Rhythm:Regular Rate:Normal     Neuro/Psych negative neurological ROS  negative psych ROS   GI/Hepatic negative GI ROS,,,(+)     substance abuse  marijuana use  Endo/Other  negative endocrine ROS    Renal/GU Renal diseaseLab Results      Component                Value               Date                                   K                        4.2                 11/08/2023                      CREATININE               0.80                11/08/2023                            Musculoskeletal negative musculoskeletal ROS (+)    Abdominal   Peds  Hematology Lab Results      Component                Value               Date                      WBC                      16.3 (H)            11/08/2023                HGB                      15.0                11/08/2023                HCT                      44.0                11/08/2023                MCV                      97.6                11/08/2023  PLT                      239                 11/08/2023              Anesthesia Other Findings   Reproductive/Obstetrics                             Anesthesia Physical Anesthesia Plan  ASA: 2  Anesthesia Plan: Regional and General   Post-op Pain Management: Regional block*, Minimal or no pain anticipated, Ofirmev  IV (intra-op)* and Precedex   Induction: Intravenous  PONV Risk Score and Plan: 2  and Treatment may vary due to age or medical condition, Midazolam , Ondansetron , Propofol  infusion and TIVA  Airway Management Planned: LMA  Additional Equipment: None  Intra-op Plan:   Post-operative Plan: Extubation in OR  Informed Consent: I have reviewed the patients History and Physical, chart, labs and discussed the procedure including the risks, benefits and alternatives for the proposed anesthesia with the patient or authorized representative who has indicated his/her understanding and acceptance.     Dental advisory given  Plan Discussed with: CRNA and Surgeon  Anesthesia Plan Comments: (GA w R popliteal + adductor)       Anesthesia Quick Evaluation

## 2023-11-18 ENCOUNTER — Encounter (HOSPITAL_COMMUNITY)
Admission: RE | Disposition: A | Discharge status: Home / Self Care | Payer: Self-pay | Source: Home / Self Care | Attending: Orthopaedic Surgery

## 2023-11-18 ENCOUNTER — Ambulatory Visit (HOSPITAL_COMMUNITY): Payer: Self-pay | Admitting: Physician Assistant

## 2023-11-18 ENCOUNTER — Ambulatory Visit (HOSPITAL_COMMUNITY)

## 2023-11-18 ENCOUNTER — Other Ambulatory Visit: Payer: Self-pay

## 2023-11-18 ENCOUNTER — Ambulatory Visit (HOSPITAL_COMMUNITY)
Admission: RE | Admit: 2023-11-18 | Discharge: 2023-11-18 | Disposition: A | Discharge status: Home / Self Care | Attending: Orthopaedic Surgery | Admitting: Orthopaedic Surgery

## 2023-11-18 ENCOUNTER — Encounter (HOSPITAL_COMMUNITY): Payer: Self-pay | Admitting: Orthopaedic Surgery

## 2023-11-18 ENCOUNTER — Ambulatory Visit (HOSPITAL_BASED_OUTPATIENT_CLINIC_OR_DEPARTMENT_OTHER): Payer: Self-pay | Admitting: Anesthesiology

## 2023-11-18 DIAGNOSIS — Z87442 Personal history of urinary calculi: Secondary | ICD-10-CM | POA: Diagnosis not present

## 2023-11-18 DIAGNOSIS — S82851A Displaced trimalleolar fracture of right lower leg, initial encounter for closed fracture: Secondary | ICD-10-CM | POA: Diagnosis not present

## 2023-11-18 DIAGNOSIS — S82874A Nondisplaced pilon fracture of right tibia, initial encounter for closed fracture: Secondary | ICD-10-CM | POA: Diagnosis present

## 2023-11-18 DIAGNOSIS — Z79899 Other long term (current) drug therapy: Secondary | ICD-10-CM | POA: Insufficient documentation

## 2023-11-18 DIAGNOSIS — S93431A Sprain of tibiofibular ligament of right ankle, initial encounter: Secondary | ICD-10-CM | POA: Diagnosis not present

## 2023-11-18 DIAGNOSIS — S82891A Other fracture of right lower leg, initial encounter for closed fracture: Secondary | ICD-10-CM | POA: Diagnosis present

## 2023-11-18 DIAGNOSIS — S82871A Displaced pilon fracture of right tibia, initial encounter for closed fracture: Secondary | ICD-10-CM

## 2023-11-18 DIAGNOSIS — F17210 Nicotine dependence, cigarettes, uncomplicated: Secondary | ICD-10-CM | POA: Insufficient documentation

## 2023-11-18 DIAGNOSIS — S82891S Other fracture of right lower leg, sequela: Secondary | ICD-10-CM

## 2023-11-18 DIAGNOSIS — Z791 Long term (current) use of non-steroidal anti-inflammatories (NSAID): Secondary | ICD-10-CM | POA: Diagnosis not present

## 2023-11-18 HISTORY — PX: SYNDESMOSIS REPAIR: SHX5182

## 2023-11-18 HISTORY — PX: ORIF ANKLE FRACTURE: SHX5408

## 2023-11-18 SURGERY — OPEN REDUCTION INTERNAL FIXATION (ORIF) ANKLE FRACTURE
Anesthesia: Monitor Anesthesia Care | Site: Ankle | Laterality: Right

## 2023-11-18 MED ORDER — FENTANYL CITRATE PF 50 MCG/ML IJ SOSY
50.0000 ug | PREFILLED_SYRINGE | INTRAMUSCULAR | Status: AC
  Administered 2023-11-18: 100 ug via INTRAVENOUS
  Filled 2023-11-18: qty 2

## 2023-11-18 MED ORDER — PROPOFOL 1000 MG/100ML IV EMUL
INTRAVENOUS | Status: AC
  Filled 2023-11-18: qty 100

## 2023-11-18 MED ORDER — FENTANYL CITRATE (PF) 100 MCG/2ML IJ SOLN
INTRAMUSCULAR | Status: DC | PRN
  Administered 2023-11-18 (×2): 25 ug via INTRAVENOUS

## 2023-11-18 MED ORDER — OXYCODONE HCL 5 MG PO TABS: 5.0000 mg | ORAL_TABLET | Freq: Once | ORAL | Status: DC | PRN

## 2023-11-18 MED ORDER — CHLORHEXIDINE GLUCONATE 0.12 % MT SOLN
15.0000 mL | Freq: Once | OROMUCOSAL | Status: AC
  Administered 2023-11-18: 15 mL via OROMUCOSAL

## 2023-11-18 MED ORDER — PROPOFOL 10 MG/ML IV BOLUS
INTRAVENOUS | Status: AC
  Filled 2023-11-18: qty 20

## 2023-11-18 MED ORDER — HYDROMORPHONE HCL 1 MG/ML IJ SOLN: 0.2500 mg | INTRAMUSCULAR | Status: DC | PRN

## 2023-11-18 MED ORDER — DEXMEDETOMIDINE HCL IN NACL 80 MCG/20ML IV SOLN
INTRAVENOUS | Status: DC | PRN
  Administered 2023-11-18: 12 ug via INTRAVENOUS

## 2023-11-18 MED ORDER — LIDOCAINE HCL (PF) 2 % IJ SOLN
INTRAMUSCULAR | Status: DC | PRN
  Administered 2023-11-18: 100 mg via INTRADERMAL

## 2023-11-18 MED ORDER — PHENYLEPHRINE 80 MCG/ML (10ML) SYRINGE FOR IV PUSH (FOR BLOOD PRESSURE SUPPORT)
PREFILLED_SYRINGE | INTRAVENOUS | Status: AC
  Filled 2023-11-18: qty 10

## 2023-11-18 MED ORDER — POVIDONE-IODINE 7.5 % EX SOLN: Freq: Once | CUTANEOUS | Status: DC

## 2023-11-18 MED ORDER — MIDAZOLAM HCL 2 MG/2ML IJ SOLN
1.0000 mg | INTRAMUSCULAR | Status: AC
  Administered 2023-11-18: 2 mg via INTRAVENOUS
  Filled 2023-11-18: qty 2

## 2023-11-18 MED ORDER — CEFAZOLIN SODIUM-DEXTROSE 2-4 GM/100ML-% IV SOLN
2.0000 g | INTRAVENOUS | Status: AC
  Administered 2023-11-18: 2 g via INTRAVENOUS
  Filled 2023-11-18: qty 100

## 2023-11-18 MED ORDER — OXYCODONE HCL 5 MG/5ML PO SOLN: 5.0000 mg | Freq: Once | ORAL | Status: DC | PRN

## 2023-11-18 MED ORDER — PROPOFOL 10 MG/ML IV BOLUS
INTRAVENOUS | Status: DC | PRN
Start: 2023-11-18 — End: 2023-11-18
  Administered 2023-11-18: 170 mg via INTRAVENOUS
  Administered 2023-11-18: 125 ug/kg/min via INTRAVENOUS

## 2023-11-18 MED ORDER — ASPIRIN 325 MG PO TABS: ORAL_TABLET | ORAL | 0 refills | Status: DC

## 2023-11-18 MED ORDER — POVIDONE-IODINE 10 % EX SWAB: 2.0000 | Freq: Once | CUTANEOUS | Status: DC

## 2023-11-18 MED ORDER — BUPIVACAINE-EPINEPHRINE (PF) 0.5% -1:200000 IJ SOLN
INTRAMUSCULAR | Status: DC | PRN
  Administered 2023-11-18: 30 mL

## 2023-11-18 MED ORDER — ISOPROPYL ALCOHOL 70 % SOLN
Status: DC | PRN
  Administered 2023-11-18: 1 via TOPICAL

## 2023-11-18 MED ORDER — CLONIDINE HCL (ANALGESIA) 100 MCG/ML EP SOLN
EPIDURAL | Status: DC | PRN
  Administered 2023-11-18: 100 ug

## 2023-11-18 MED ORDER — OXYCODONE HCL 5 MG PO TABS: 5.0000 mg | ORAL_TABLET | ORAL | 0 refills | Status: AC | PRN

## 2023-11-18 MED ORDER — ROPIVACAINE HCL 5 MG/ML IJ SOLN
INTRAMUSCULAR | Status: DC | PRN
  Administered 2023-11-18: 15 mL via PERINEURAL

## 2023-11-18 MED ORDER — FENTANYL CITRATE (PF) 100 MCG/2ML IJ SOLN
INTRAMUSCULAR | Status: AC
Start: 2023-11-18 — End: ?
  Filled 2023-11-18: qty 2

## 2023-11-18 MED ORDER — 0.9 % SODIUM CHLORIDE (POUR BTL) OPTIME
TOPICAL | Status: DC | PRN
  Administered 2023-11-18: 1000 mL

## 2023-11-18 MED ORDER — KETOROLAC TROMETHAMINE 30 MG/ML IJ SOLN: 30.0000 mg | Freq: Once | INTRAMUSCULAR | Status: DC | PRN

## 2023-11-18 MED ORDER — LIDOCAINE HCL (PF) 2 % IJ SOLN
INTRAMUSCULAR | Status: AC
Start: 2023-11-18 — End: ?
  Filled 2023-11-18: qty 5

## 2023-11-18 MED ORDER — LACTATED RINGERS IV SOLN: INTRAVENOUS | Status: DC

## 2023-11-18 MED ORDER — PHENYLEPHRINE 80 MCG/ML (10ML) SYRINGE FOR IV PUSH (FOR BLOOD PRESSURE SUPPORT)
PREFILLED_SYRINGE | INTRAVENOUS | Status: DC | PRN
  Administered 2023-11-18 (×6): 80 ug via INTRAVENOUS

## 2023-11-18 MED ORDER — ONDANSETRON HCL 4 MG/2ML IJ SOLN: 4.0000 mg | Freq: Once | INTRAMUSCULAR | Status: DC | PRN

## 2023-11-18 MED ORDER — ORAL CARE MOUTH RINSE: 15.0000 mL | Freq: Once | OROMUCOSAL | Status: AC

## 2023-11-18 SURGICAL SUPPLY — 50 items
BAG COUNTER SPONGE SURGICOUNT (BAG) IMPLANT
BANDAGE ESMARK 6X9 LF (GAUZE/BANDAGES/DRESSINGS) IMPLANT
BIT DRILL 2 CANN GRADUATED (BIT) IMPLANT
BIT DRILL 2.5 CANN STRL (BIT) IMPLANT
BIT DRILL 2.6 CANN (BIT) IMPLANT
BNDG COHESIVE 4X5 TAN STRL LF (GAUZE/BANDAGES/DRESSINGS) IMPLANT
BNDG ELASTIC 6INX 5YD STR LF (GAUZE/BANDAGES/DRESSINGS) ×1 IMPLANT
CHLORAPREP W/TINT 26 (MISCELLANEOUS) ×1 IMPLANT
COVER MAYO STAND STRL (DRAPES) IMPLANT
CUFF TRNQT CYL 34X4.125X (TOURNIQUET CUFF) ×1 IMPLANT
DRAPE C-ARM 42X120 X-RAY (DRAPES) ×1 IMPLANT
DRAPE C-ARMOR (DRAPES) IMPLANT
DRAPE U-SHAPE 47X51 STRL (DRAPES) ×1 IMPLANT
ELECT PENCIL ROCKER SW 15FT (MISCELLANEOUS) ×1 IMPLANT
ELECT REM PT RETURN 15FT ADLT (MISCELLANEOUS) ×1 IMPLANT
GAUZE SPONGE 4X4 12PLY STRL (GAUZE/BANDAGES/DRESSINGS) ×1 IMPLANT
GAUZE XEROFORM 5X9 LF (GAUZE/BANDAGES/DRESSINGS) ×1 IMPLANT
GLOVE BIOGEL PI IND STRL 8 (GLOVE) ×2 IMPLANT
GLOVE SURG LX STRL 7.5 STRW (GLOVE) ×2 IMPLANT
GOWN STRL REUS W/ TWL XL LVL3 (GOWN DISPOSABLE) ×2 IMPLANT
GUIDEWIRE 1.35MM (WIRE) IMPLANT
KIT BASIN OR (CUSTOM PROCEDURE TRAY) ×1 IMPLANT
KIT TURNOVER KIT A (KITS) IMPLANT
NS IRRIG 1000ML POUR BTL (IV SOLUTION) ×1 IMPLANT
PACK ORTHO EXTREMITY (CUSTOM PROCEDURE TRAY) ×1 IMPLANT
PAD CAST 4YDX4 CTTN HI CHSV (CAST SUPPLIES) ×1 IMPLANT
PADDING CAST SYNTHETIC 4X4 STR (CAST SUPPLIES) ×2 IMPLANT
PLATE LOCK DIST FIB RT 5H (Plate) IMPLANT
SCREW CORTICAL 3MMX18MM (Screw) IMPLANT
SCREW KREULOCK 3.0X10 (Screw) IMPLANT
SCREW LOCK COMP 3X16 (Screw) IMPLANT
SCREW LP 3.5X12MM (Screw) IMPLANT
SCREW LP TI 3.5X14MM (Screw) IMPLANT
SCREW QCKFIX CANN 4.0X40MM (Screw) IMPLANT
SPIKE FLUID TRANSFER (MISCELLANEOUS) IMPLANT
SPLINT PLASTER CAST XFAST 5X30 (CAST SUPPLIES) ×20 IMPLANT
STAPLER SKIN PROX 35W (STAPLE) IMPLANT
STRAP ANKLE DISTRACTOR (MISCELLANEOUS) ×1 IMPLANT
STRIP CLOSURE SKIN 1/2X4 (GAUZE/BANDAGES/DRESSINGS) IMPLANT
SUCTION TUBE FRAZIER 10FR DISP (SUCTIONS) ×1 IMPLANT
SUT ETHILON 3 0 PS 1 (SUTURE) IMPLANT
SUT MNCRL AB 3-0 PS2 18 (SUTURE) IMPLANT
SUT PDS AB 2-0 CT2 27 (SUTURE) IMPLANT
SUT VIC AB 0 CT1 36 (SUTURE) ×1 IMPLANT
SUT VIC AB 2-0 SH 27XBRD (SUTURE) ×1 IMPLANT
SUT VIC AB 3-0 FS2 27 (SUTURE) IMPLANT
SYNDESMOSIS TIGHTROPE XP (Orthopedic Implant) IMPLANT
TOWEL OR 17X26 10 PK STRL BLUE (TOWEL DISPOSABLE) ×1 IMPLANT
UNDERPAD 30X36 HEAVY ABSORB (UNDERPADS AND DIAPERS) ×1 IMPLANT
YANKAUER SUCT BULB TIP 10FT TU (MISCELLANEOUS) ×1 IMPLANT

## 2023-11-18 NOTE — Anesthesia Postprocedure Evaluation (Signed)
 Anesthesia Post Note  [name]  Procedure(s) Performed: OPEN REDUCTION INTERNAL FIXATION (ORIF) ANKLE FRACTURE (Right: Ankle) REPAIR, SYNDESMOSIS, ANKLE (Right: Ankle)     Patient location during evaluation: PACU Anesthesia Type: Regional and General Level of consciousness: awake and alert Pain management: pain level controlled Vital Signs Assessment: post-procedure vital signs reviewed and stable Respiratory status: spontaneous breathing, nonlabored ventilation, respiratory function stable and patient connected to nasal cannula oxygen Cardiovascular status: blood pressure returned to baseline and stable Postop Assessment: no apparent nausea or vomiting Anesthetic complications: no  No notable events documented.  Last Vitals:  Vitals:   11/18/23 1330 11/18/23 1400  BP: 96/61 (!) 98/59  Pulse: 61 (!) 58  Resp: 14   Temp:    SpO2: 97% 100%    Last Pain:  Vitals:   11/18/23 1400  TempSrc:   PainSc: 0-No pain                 Rosalita Combe

## 2023-11-18 NOTE — Anesthesia Procedure Notes (Signed)
 Anesthesia Regional Block: Adductor canal block   Pre-Anesthetic Checklist: , timeout performed,  Correct Patient, Correct Site, Correct Laterality,  Correct Procedure, Correct Position, site marked,  Risks and benefits discussed,  Surgical consent,  Pre-op evaluation,  At surgeon's request and post-op pain management  Laterality: Lower and Right  Prep: chloraprep       Needles:  Injection technique: Single-shot  Needle Type: Echogenic Needle     Needle Length: 9cm  Needle Gauge: 22     Additional Needles:   Procedures:,,,, ultrasound used (permanent image in chart),,    Narrative:  Start time: 11/18/2023 10:35 AM End time: 11/18/2023 10:39 AM Injection made incrementally with aspirations every 5 mL.  Performed by: Personally  Anesthesiologist: Rosalita Combe, MD  Additional Notes: Block assessed prior to surgery. Pt tolerated procedure well.

## 2023-11-18 NOTE — Transfer of Care (Signed)
 Immediate Anesthesia Transfer of Care Note  Patient: Jerrard Bradburn  Procedure(s) Performed: OPEN REDUCTION INTERNAL FIXATION (ORIF) ANKLE FRACTURE (Right: Ankle) REPAIR, SYNDESMOSIS, ANKLE (Right: Ankle)  Patient Location: PACU  Anesthesia Type:General  Level of Consciousness: drowsy and patient cooperative  Airway & Oxygen Therapy: Patient Spontanous Breathing and Patient connected to nasal cannula oxygen  Post-op Assessment: Report given to RN and Post -op Vital signs reviewed and stable  Post vital signs: Reviewed and stable  Last Vitals:  Vitals Value Taken Time  BP 94/59 11/18/23 1315  Temp 36.4 C 11/18/23 1315  Pulse 63 11/18/23 1316  Resp 16 11/18/23 1316  SpO2 96 % 11/18/23 1316  Vitals shown include unfiled device data.  Last Pain:  Vitals:   11/18/23 0914  TempSrc: Oral  PainSc: 8       Patients Stated Pain Goal: 5 (11/18/23 0914)  Complications: No notable events documented.

## 2023-11-18 NOTE — H&P (Signed)
 PREOPERATIVE H&P  Chief Complaint: Right ankle pain  HPI: Aaron Fry is a 33 y.o. male who presents for preoperative history and physical with a diagnosis of right trimalleolar ankle fracture with displacement and comminution. He is here today for surgery. Symptoms are rated as moderate to severe, and have been worsening.  This is significantly impairing activities of daily living.  He has elected for surgical management.   Past Medical History:  Diagnosis Date   History of kidney stones    Kidney stones    Past Surgical History:  Procedure Laterality Date   EXTERNAL FIXATION LEG Left 04/29/2018   Procedure: EXTERNAL FIXATION LEG;  Surgeon: Timothy Ford, MD;  Location: [facility] OR;  Service: Orthopedics;  Laterality: Left;   INGUINAL HERNIA REPAIR Right 03/02/2022   Procedure: HERNIA REPAIR INGUINAL INCARCERATED;  Surgeon: Enid Harry, MD;  Location: [facility] ORS;  Service: General;  Laterality: Right;   ORIF ANKLE FRACTURE Left 04/29/2018   Procedure: OPEN REDUCTION INTERNAL FIXATION (ORIF) LEFT NAVICULAR AND EXTERNAL FIXATION;  Surgeon: Timothy Ford, MD;  Location: [facility] OR;  Service: Orthopedics;  Laterality: Left;   Social History   Socioeconomic History   Marital status: Single    Spouse name: Not on file   Number of children: Not on file   Years of education: Not on file   Highest education level: Not on file  Occupational History   Not on file  Tobacco Use   Smoking status: Every Day    Current packs/day: 0.25    Average packs/day: 0.3 packs/day for 0.4 years (0.1 ttl pk-yrs)    Types: Cigarettes    Start date: 06/11/2023   Smokeless tobacco: Never  Vaping Use   Vaping status: Never Used  Substance and Sexual Activity   Alcohol use: Yes    Comment: rarely   Drug use: Yes    Types: Marijuana    Comment: rarely    Sexual activity: Not Currently  Other Topics Concern   Not on file  Social History Narrative   ** Merged History Encounter **       Social  Drivers of Health   Financial Resource Strain: Not on file  Food Insecurity: No Food Insecurity (03/02/2022)   Hunger Vital Sign    Worried About Running Out of Food in the Last Year: Never true    Ran Out of Food in the Last Year: Never true  Transportation Needs: No Transportation Needs (03/02/2022)   PRAPARE - Administrator, Civil Service (Medical): No    Lack of Transportation (Non-Medical): No  Physical Activity: Not on file  Stress: Not on file  Social Connections: Not on file   Family History  Problem Relation Age of Onset   Diabetes Mother    No Known Allergies Prior to Admission medications   Medication Sig Start Date End Date Taking? Authorizing Provider  HYDROcodone -acetaminophen  (NORCO/VICODIN) 5-325 MG tablet Take 1 tablet by mouth every 6 (six) hours as needed. 11/09/23  Yes Adel Aden, PA-C  cyclobenzaprine  (FLEXERIL ) 10 MG tablet Take 1 tablet (10 mg total) by mouth 2 (two) times daily as needed for muscle spasms. Patient not taking: Reported on 03/02/2022 10/08/21   Volney Grumbles, PA-C  hydrOXYzine  (ATARAX ) 25 MG tablet Take 1 tablet (25 mg total) by mouth every 8 (eight) hours as needed for anxiety. Patient not taking: Reported on 03/02/2022 09/29/21   Mozell Arias, MD  ketorolac  (TORADOL ) 10 MG tablet Take 1 tablet (10 mg  total) by mouth every 6 (six) hours as needed. Patient not taking: Reported on 11/13/2023 09/27/23   Harris, Abigail, PA-C  methocarbamol  (ROBAXIN ) 500 MG tablet Take 1 tablet (500 mg total) by mouth 2 (two) times daily. Patient not taking: Reported on 03/02/2022 12/03/21   Lucina Sabal, PA-C  naproxen  (NAPROSYN ) 375 MG tablet Take 1 tablet (375 mg total) by mouth 2 (two) times daily. Patient not taking: Reported on 11/13/2023 09/23/23   Hershel Los, MD  ondansetron  (ZOFRAN ) 4 MG tablet Take 1 tablet (4 mg total) by mouth every 8 (eight) hours as needed for nausea or vomiting. Patient not taking: Reported on 11/13/2023 09/27/23    Harris, Abigail, PA-C  ondansetron  (ZOFRAN -ODT) 4 MG disintegrating tablet Take 1 tablet (4 mg total) by mouth every 6 (six) hours as needed for nausea or vomiting. Patient not taking: Reported on 11/13/2023 10/23/23   Iver Marker, MD  oxyCODONE  (OXY IR/ROXICODONE ) 5 MG immediate release tablet Take 1 tablet (5 mg total) by mouth every 6 (six) hours as needed for severe pain (pain score 7-10). Patient not taking: Reported on 11/13/2023 10/23/23   Iver Marker, MD  tamsulosin  (FLOMAX ) 0.4 MG CAPS capsule Take 1 capsule (0.4 mg total) by mouth daily. Patient not taking: Reported on 11/13/2023 10/23/23   Iver Marker, MD  famotidine  (PEPCID ) 20 MG tablet Take 1 tablet (20 mg total) by mouth 2 (two) times daily. 05/30/19 03/12/20  Ballard Bongo, MD     Positive ROS: All other systems have been reviewed and were otherwise negative with the exception of those mentioned in the HPI and as above.  Physical Exam:  Vitals:   11/18/23 0914  BP: 118/72  Pulse: 91  Resp: 16  Temp: 98.2 F (36.8 C)  SpO2: 100%   General: Alert, no acute distress Cardiovascular: No pedal edema Respiratory: No cyanosis, no use of accessory musculature GI: No organomegaly, abdomen is soft and non-tender Skin: No lesions in the area of chief complaint Neurologic: Sensation intact distally Psychiatric: Patient is competent for consent with normal mood and affect Lymphatic: No axillary or cervical lymphadenopathy  MUSCULOSKELETAL: Right leg in a short leg splint.  Clinically is well aligned.  No tenderness proximal or distal to the splint.  Forefoot is exposed and is warm and well-perfused.  Sensation grossly intact.  Assessment: Right trimalleolar ankle fracture with intra-articular extension of the weightbearing surface of the tibia.   Plan: Plan for open treatment of his fractures.  This will be done as an outpatient surgery.  He will be discharged from the PACU.  We discussed the risks, benefits and alternatives  of surgery which include but are not limited to wound healing complications, infection, nonunion, malunion, need for further surgery, damage to surrounding structures and continued pain.  They understand there is no guarantees to an acceptable outcome.  After weighing these risks they opted to proceed with surgery.     Donnamarie Gables, MD    11/18/2023 9:44 AM
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
## 2023-11-18 NOTE — Anesthesia Procedure Notes (Signed)
 Procedure Name: LMA Insertion Date/Time: 11/18/2023 11:35 AM  Performed by: Juluis Ok, CRNAPre-anesthesia Checklist: Patient identified, Emergency Drugs available, Suction available and Patient being monitored Patient Re-evaluated:Patient Re-evaluated prior to induction Oxygen Delivery Method: Circle system utilized Preoxygenation: Pre-oxygenation with 100% oxygen Induction Type: IV induction Ventilation: Mask ventilation without difficulty LMA: LMA inserted LMA Size: 4.0 Number of attempts: 1 ETT to lip (cm): No markings on LMA. Tube secured with: Tape Dental Injury: Teeth and Oropharynx as per pre-operative assessment  Comments: Atraumatic insertion of LMA size 4. Lips and teeth remain in preoperative condition.

## 2023-11-18 NOTE — Anesthesia Procedure Notes (Signed)
 Anesthesia Regional Block: Popliteal block   Pre-Anesthetic Checklist: , timeout performed,  Correct Patient, Correct Site, Correct Laterality,  Correct Procedure, Correct Position, site marked,  Risks and benefits discussed,  Pre-op evaluation,  At surgeon's request and post-op pain management  Laterality: Lower and Right  Prep: Maximum Sterile Barrier Precautions used, chloraprep       Needles:  Injection technique: Single-shot  Needle Type: Echogenic Needle     Needle Length: 9cm  Needle Gauge: 21     Additional Needles:   Procedures:,,,, ultrasound used (permanent image in chart),,    Narrative:  Start time: 11/18/2023 10:30 AM End time: 11/18/2023 10:35 AM Injection made incrementally with aspirations every 5 mL.  Performed by: Personally  Anesthesiologist: Rosalita Combe, MD  Additional Notes: Block assessed. Patient tolerated procedure well.

## 2023-11-18 NOTE — Discharge Instructions (Signed)
 DR. Susa Simmonds FOOT [facility] ANKLE SURGERY POST-OP INSTRUCTIONS   Pain Management The numbing medicine and your leg will last around 18 hours, take a dose of your pain medicine as soon as you feel it wearing off to avoid rebound pain. Keep your foot elevated above heart level.  Make sure that your heel hangs free ('floats'). Take all prescribed medication as directed. If taking narcotic pain medication you may want to use an over-the-counter stool softener to avoid constipation. You may take over-the-counter NSAIDs (ibuprofen, naproxen, etc.) as well as over-the-counter acetaminophen as directed on the packaging as a supplement for your pain and may also use it to wean away from the prescription medication.  Activity Non-weightbearing Keep splint intact  First Postoperative Visit Your first postop visit will be at least 2 weeks after surgery.  This should be scheduled when you schedule surgery. If you do not have a postoperative visit scheduled please call [phone] to schedule an appointment. At the appointment your incision will be evaluated for suture removal, x-rays will be obtained if necessary.  General Instructions Swelling is very common after foot and ankle surgery.  It often takes 3 months for the foot and ankle to begin to feel comfortable.  Some amount of swelling will persist for 6-12 months. DO NOT change the dressing.  If there is a problem with the dressing (too tight, loose, gets wet, etc.) please contact Dr. Donnie Mesa office. DO NOT get the dressing wet.  For showers you can use an over-the-counter cast cover or wrap a washcloth around the top of your dressing and then cover it with a plastic bag and tape it to your leg. DO NOT soak the incision (no tubs, pools, bath, etc.) until you have approval from Dr. Susa Simmonds.  Contact Dr. Garret Reddish office or go to Emergency Room if: Temperature above 101 F. Increasing pain that is unresponsive to pain medication or elevation Excessive redness or  swelling in your foot Dressing problems - excessive bloody drainage, looseness or tightness, or if dressing gets wet Develop pain, swelling, warmth, or discoloration of your calf

## 2023-11-19 ENCOUNTER — Encounter (HOSPITAL_COMMUNITY): Payer: Self-pay | Admitting: Orthopaedic Surgery

## 2023-11-21 ENCOUNTER — Emergency Department (HOSPITAL_COMMUNITY)

## 2023-11-21 ENCOUNTER — Observation Stay (HOSPITAL_COMMUNITY)
Admission: EM | Admit: 2023-11-21 | Discharge: 2023-11-22 | Disposition: A | Discharge status: Home / Self Care | Attending: Emergency Medicine | Admitting: Emergency Medicine

## 2023-11-21 ENCOUNTER — Encounter (HOSPITAL_COMMUNITY): Payer: Self-pay

## 2023-11-21 ENCOUNTER — Other Ambulatory Visit: Payer: Self-pay

## 2023-11-21 DIAGNOSIS — R1032 Left lower quadrant pain: Secondary | ICD-10-CM | POA: Diagnosis present

## 2023-11-21 DIAGNOSIS — F17210 Nicotine dependence, cigarettes, uncomplicated: Secondary | ICD-10-CM | POA: Diagnosis not present

## 2023-11-21 DIAGNOSIS — Z96651 Presence of right artificial knee joint: Secondary | ICD-10-CM | POA: Diagnosis not present

## 2023-11-21 DIAGNOSIS — S82851D Displaced trimalleolar fracture of right lower leg, subsequent encounter for closed fracture with routine healing: Secondary | ICD-10-CM | POA: Insufficient documentation

## 2023-11-21 DIAGNOSIS — F1090 Alcohol use, unspecified, uncomplicated: Secondary | ICD-10-CM | POA: Diagnosis not present

## 2023-11-21 DIAGNOSIS — F1490 Cocaine use, unspecified, uncomplicated: Secondary | ICD-10-CM | POA: Insufficient documentation

## 2023-11-21 DIAGNOSIS — N202 Calculus of kidney with calculus of ureter: Secondary | ICD-10-CM | POA: Diagnosis not present

## 2023-11-21 DIAGNOSIS — X58XXXD Exposure to other specified factors, subsequent encounter: Secondary | ICD-10-CM | POA: Insufficient documentation

## 2023-11-21 DIAGNOSIS — N201 Calculus of ureter: Secondary | ICD-10-CM | POA: Diagnosis present

## 2023-11-21 DIAGNOSIS — S82851A Displaced trimalleolar fracture of right lower leg, initial encounter for closed fracture: Secondary | ICD-10-CM

## 2023-11-21 DIAGNOSIS — N200 Calculus of kidney: Principal | ICD-10-CM

## 2023-11-21 LAB — COMPREHENSIVE METABOLIC PANEL WITH GFR
ALT: 19 U/L (ref 0–44)
AST: 23 U/L (ref 15–41)
Albumin: 3.7 g/dL (ref 3.5–5.0)
Alkaline Phosphatase: 40 U/L (ref 38–126)
Anion gap: 12 (ref 5–15)
BUN: 17 mg/dL (ref 6–20)
CO2: 25 mmol/L (ref 22–32)
Calcium: 9.6 mg/dL (ref 8.9–10.3)
Chloride: 98 mmol/L (ref 98–111)
Creatinine, Ser: 1.2 mg/dL (ref 0.61–1.24)
GFR, Estimated: >60 mL/min (ref >60)
Glucose, Bld: 112 mg/dL — ABNORMAL HIGH (ref 70–99)
Potassium: 3.7 mmol/L (ref 3.5–5.1)
Sodium: 135 mmol/L (ref 135–145)
Total Bilirubin: 0.9 mg/dL (ref 0.0–1.2)
Total Protein: 7.3 g/dL (ref 6.5–8.1)

## 2023-11-21 LAB — CBC WITH DIFFERENTIAL/PLATELET
Abs Immature Granulocytes: 0.06 10*3/uL (ref 0.00–0.07)
Basophils Absolute: 0.1 10*3/uL (ref 0.0–0.1)
Basophils Relative: 0 %
Eosinophils Absolute: 0.1 10*3/uL (ref 0.0–0.5)
Eosinophils Relative: 0 %
HCT: 37.7 % — ABNORMAL LOW (ref 39.0–52.0)
Hemoglobin: 13.1 g/dL (ref 13.0–17.0)
Immature Granulocytes: 0 %
Lymphocytes Relative: 14 %
Lymphs Abs: 2 10*3/uL (ref 0.7–4.0)
MCH: 33.2 pg (ref 26.0–34.0)
MCHC: 34.7 g/dL (ref 30.0–36.0)
MCV: 95.7 fL (ref 80.0–100.0)
Monocytes Absolute: 1.3 10*3/uL — ABNORMAL HIGH (ref 0.1–1.0)
Monocytes Relative: 9 %
Neutro Abs: 11.4 10*3/uL — ABNORMAL HIGH (ref 1.7–7.7)
Neutrophils Relative %: 77 %
Platelets: 315 10*3/uL (ref 150–400)
RBC: 3.94 MIL/uL — ABNORMAL LOW (ref 4.22–5.81)
RDW: 12.1 % (ref 11.5–15.5)
WBC: 14.9 10*3/uL — ABNORMAL HIGH (ref 4.0–10.5)
nRBC: 0 % (ref 0.0–0.2)

## 2023-11-21 LAB — URINALYSIS, ROUTINE W REFLEX MICROSCOPIC
Bacteria, UA: NONE SEEN
Bilirubin Urine: NEGATIVE
Glucose, UA: NEGATIVE mg/dL
Ketones, ur: NEGATIVE mg/dL
Nitrite: NEGATIVE
Protein, ur: 30 mg/dL — AB
RBC / HPF: >50 RBC/hpf (ref 0–5)
Specific Gravity, Urine: 1.023 (ref 1.005–1.030)
pH: 5 (ref 5.0–8.0)

## 2023-11-21 MED ORDER — IBUPROFEN 800 MG PO TABS: 800.0000 mg | ORAL_TABLET | Freq: Once | ORAL | Status: DC

## 2023-11-21 MED ORDER — SODIUM CHLORIDE 0.9 % IV SOLN
1.0000 g | Freq: Once | INTRAVENOUS | Status: AC
  Administered 2023-11-22: 1 g via INTRAVENOUS
  Filled 2023-11-21: qty 10

## 2023-11-21 MED ORDER — MAGNESIUM SULFATE 2 GM/50ML IV SOLN
2.0000 g | Freq: Once | INTRAVENOUS | Status: AC
Start: 2023-11-21 — End: 2023-11-22
  Administered 2023-11-21: 2 g via INTRAVENOUS
  Filled 2023-11-21: qty 50

## 2023-11-21 MED ORDER — KETOROLAC TROMETHAMINE 30 MG/ML IJ SOLN
30.0000 mg | Freq: Once | INTRAMUSCULAR | Status: AC
  Administered 2023-11-21: 30 mg via INTRAVENOUS
  Filled 2023-11-21: qty 1

## 2023-11-21 MED ORDER — ONDANSETRON HCL 4 MG/2ML IJ SOLN
4.0000 mg | Freq: Once | INTRAMUSCULAR | Status: AC
  Administered 2023-11-21: 4 mg via INTRAVENOUS
  Filled 2023-11-21: qty 2

## 2023-11-21 NOTE — ED Provider Notes (Signed)
 [facility] DEPT [facility] Emergency Department Provider Note MRN:  [id number]  Arrival date & time: 11/22/23     Chief Complaint   Flank Pain   History of Present Illness   Aaron Fry is a 33 y.o. year-old male presents to the ED with chief complaint of left-sided flank pain.  History of kidney stones.  States this feels similar.  Onset of symptoms was tonight around 5:30 PM.  He reports associated hematuria.  States that his pain is poorly controlled with home oxycodone .  He denies fevers.  Denies any other associated symptoms.  History provided by patient.   Review of Systems  Pertinent positive and negative review of systems noted in HPI.    Physical Exam   Vitals:   11/22/23 0051 11/22/23 0100  BP: 98/66 (!) 105/57  Pulse: 75 (!) 58  Resp:  (!) 21  Temp:    SpO2: 99% 100%    CONSTITUTIONAL:  non toxic-appearing, NAD NEURO:  Alert and oriented x 3, CN 3-12 grossly intact EYES:  eyes equal and reactive ENT/NECK:  Supple, no stridor  CARDIO:  normal rate, regular rhythm, appears well-perfused  PULM:  No respiratory distress, CTAB GI/GU:  non-distended, mild left sided tenderness MSK/SPINE:  No gross deformities, no edema, moves all extremities  SKIN:  no rash, atraumatic   *Additional and/or pertinent findings included in MDM below  Diagnostic and Interventional Summary    EKG Interpretation Date/Time:    Ventricular Rate:    PR Interval:    QRS Duration:    QT Interval:    QTC Calculation:   R Axis:      Text Interpretation:         Labs Reviewed  COMPREHENSIVE METABOLIC PANEL WITH GFR - Abnormal; Notable for the following components:      Result Value   Glucose, Bld 112 (*)    All other components within normal limits  URINALYSIS, ROUTINE W REFLEX MICROSCOPIC - Abnormal; Notable for the following components:   Color, Urine AMBER (*)    APPearance HAZY (*)    Hgb urine dipstick LARGE (*)    Protein, ur 30 (*)    Leukocytes,Ua  TRACE (*)    All other components within normal limits  CBC WITH DIFFERENTIAL/PLATELET - Abnormal; Notable for the following components:   WBC 14.9 (*)    RBC 3.94 (*)    HCT 37.7 (*)    Neutro Abs 11.4 (*)    Monocytes Absolute 1.3 (*)    All other components within normal limits  CBC WITH DIFFERENTIAL/PLATELET  COMPREHENSIVE METABOLIC PANEL WITH GFR  MAGNESIUM     CT Renal Stone Study  Final Result      Medications  acetaminophen  (TYLENOL ) tablet 650 mg (has no administration in time range)    Or  acetaminophen  (TYLENOL ) suppository 650 mg (has no administration in time range)  ondansetron  (ZOFRAN ) injection 4 mg (has no administration in time range)  ketorolac  (TORADOL ) 30 MG/ML injection 30 mg (30 mg Intravenous Given 11/21/23 2341)  ondansetron  (ZOFRAN ) injection 4 mg (4 mg Intravenous Given 11/21/23 2342)  magnesium  sulfate IVPB 2 g 50 mL (0 g Intravenous Stopped 11/22/23 0021)  cefTRIAXone (ROCEPHIN) 1 g in sodium chloride  0.9 % 100 mL IVPB (0 g Intravenous Stopped 11/22/23 0103)     Procedures  /  Critical Care Procedures  ED Course and Medical Decision Making  I have reviewed the triage vital signs, the nursing notes, and pertinent available records from the EMR.  Social Determinants Affecting Complexity of Care: Patient has no clinically significant social determinants affecting this chief complaint..   ED Course: Clinical Course as of 11/22/23 0123  Sat Nov 22, 2023  0122 Comprehensive metabolic panel(!) Creatinine is noted to be 1.20, this is increased from baseline of about 0.7 [RB]  0122 CBC with Differential(!) Leukocytosis to 14.9 [RB]  0122 Urinalysis, Routine w reflex microscopic -Urine, Clean Catch(!) Urinalysis notable for 11-20 white, greater than 50 RBCs, trace leukocytes, large hemoglobin [RB]  0122 CT Renal Stone Study CT notable for left-sided stone [RB]    Clinical Course User Index [RB] Sherel Dikes, PA-C    Medical Decision  Making Patient here with left-sided flank pain.  Suddenly worsened this evening around 530 or 6 PM.  He has a known left-sided kidney stone that is been present for about a month.  Will repeat CT and check labs.  Will treat with IV Rocephin per urology.  Amount and/or Complexity of Data Reviewed Labs: ordered. Decision-making details documented in ED Course. Radiology: ordered and independent interpretation performed. Decision-making details documented in ED Course.  Risk Prescription drug management. Decision regarding hospitalization.         Consultants: I consulted with Dr. Cherylene Corrente, who recommends IV rocephin, NPO, and admission to medicine.  States he may need a stent in the morning and that it is ok for him to remain at [facility].  Requests NPO. I consulted with Dr. Brock Canner, who is appreciated for admitting.   Treatment and Plan: Patient's exam and diagnostic results are concerning for complicated kidney stone.  Feel that patient will need admission to the hospital for further treatment and evaluation.    Final Clinical Impressions(s) / ED Diagnoses     ICD-10-CM   1. Kidney stone  N20.0       ED Discharge Orders     None         Discharge Instructions Discussed with and Provided to Patient:   Discharge Instructions   None      Franklin Ito 11/22/23 0123    Palumbo, April, MD 11/22/23 [phone]

## 2023-11-21 NOTE — ED Notes (Signed)
 Patient transported to CT

## 2023-11-21 NOTE — ED Triage Notes (Signed)
 Left sided flank pain that suddenly manifested ~6pm tonight. Subjective blood in urine.   Hx of renal colic   Recent right foot surgery and has been oxycodone  for that but it is not helping with his flank pain.

## 2023-11-22 ENCOUNTER — Encounter (HOSPITAL_COMMUNITY): Payer: Self-pay | Admitting: Family Medicine

## 2023-11-22 DIAGNOSIS — S82851D Displaced trimalleolar fracture of right lower leg, subsequent encounter for closed fracture with routine healing: Secondary | ICD-10-CM | POA: Diagnosis not present

## 2023-11-22 DIAGNOSIS — N202 Calculus of kidney with calculus of ureter: Secondary | ICD-10-CM | POA: Diagnosis not present

## 2023-11-22 DIAGNOSIS — S82851S Displaced trimalleolar fracture of right lower leg, sequela: Secondary | ICD-10-CM | POA: Diagnosis not present

## 2023-11-22 DIAGNOSIS — Z96651 Presence of right artificial knee joint: Secondary | ICD-10-CM | POA: Diagnosis not present

## 2023-11-22 DIAGNOSIS — S82851A Displaced trimalleolar fracture of right lower leg, initial encounter for closed fracture: Secondary | ICD-10-CM

## 2023-11-22 DIAGNOSIS — N201 Calculus of ureter: Secondary | ICD-10-CM | POA: Diagnosis not present

## 2023-11-22 DIAGNOSIS — F17210 Nicotine dependence, cigarettes, uncomplicated: Secondary | ICD-10-CM | POA: Diagnosis not present

## 2023-11-22 LAB — MAGNESIUM: Magnesium: 2.5 mg/dL — ABNORMAL HIGH (ref 1.7–2.4)

## 2023-11-22 LAB — COMPREHENSIVE METABOLIC PANEL WITH GFR
ALT: 16 U/L (ref 0–44)
AST: 21 U/L (ref 15–41)
Albumin: 3.4 g/dL — ABNORMAL LOW (ref 3.5–5.0)
Alkaline Phosphatase: 45 U/L (ref 38–126)
Anion gap: 13 (ref 5–15)
BUN: 18 mg/dL (ref 6–20)
CO2: 25 mmol/L (ref 22–32)
Calcium: 9.2 mg/dL (ref 8.9–10.3)
Chloride: 97 mmol/L — ABNORMAL LOW (ref 98–111)
Creatinine, Ser: 1.14 mg/dL (ref 0.61–1.24)
GFR, Estimated: >60 mL/min (ref >60)
Glucose, Bld: 108 mg/dL — ABNORMAL HIGH (ref 70–99)
Potassium: 3.8 mmol/L (ref 3.5–5.1)
Sodium: 135 mmol/L (ref 135–145)
Total Bilirubin: 0.5 mg/dL (ref 0.0–1.2)
Total Protein: 6.6 g/dL (ref 6.5–8.1)

## 2023-11-22 LAB — CBC WITH DIFFERENTIAL/PLATELET
Abs Immature Granulocytes: 0.02 10*3/uL (ref 0.00–0.07)
Basophils Absolute: 0 10*3/uL (ref 0.0–0.1)
Basophils Relative: 0 %
Eosinophils Absolute: 0.1 10*3/uL (ref 0.0–0.5)
Eosinophils Relative: 1 %
HCT: 36.8 % — ABNORMAL LOW (ref 39.0–52.0)
Hemoglobin: 12.4 g/dL — ABNORMAL LOW (ref 13.0–17.0)
Immature Granulocytes: 0 %
Lymphocytes Relative: 26 %
Lymphs Abs: 2.5 10*3/uL (ref 0.7–4.0)
MCH: 32.3 pg (ref 26.0–34.0)
MCHC: 33.7 g/dL (ref 30.0–36.0)
MCV: 95.8 fL (ref 80.0–100.0)
Monocytes Absolute: 0.8 10*3/uL (ref 0.1–1.0)
Monocytes Relative: 9 %
Neutro Abs: 6.3 10*3/uL (ref 1.7–7.7)
Neutrophils Relative %: 64 %
Platelets: 311 10*3/uL (ref 150–400)
RBC: 3.84 MIL/uL — ABNORMAL LOW (ref 4.22–5.81)
RDW: 12.3 % (ref 11.5–15.5)
WBC: 9.8 10*3/uL (ref 4.0–10.5)
nRBC: 0 % (ref 0.0–0.2)

## 2023-11-22 LAB — I-STAT CG4 LACTIC ACID, ED: Lactic Acid, Venous: 0.6 mmol/L (ref 0.5–1.9)

## 2023-11-22 LAB — PROTIME-INR
INR: 1 (ref 0.8–1.2)
Prothrombin Time: 13.8 s (ref 11.4–15.2)

## 2023-11-22 MED ORDER — LACTATED RINGERS IV SOLN: INTRAVENOUS | Status: DC

## 2023-11-22 MED ORDER — TAMSULOSIN HCL 0.4 MG PO CAPS
0.4000 mg | ORAL_CAPSULE | Freq: Every day | ORAL | Status: DC
  Administered 2023-11-22: 0.4 mg via ORAL
  Filled 2023-11-22: qty 1

## 2023-11-22 MED ORDER — SODIUM CHLORIDE 0.9% FLUSH: 3.0000 mL | Freq: Two times a day (BID) | INTRAVENOUS | Status: DC

## 2023-11-22 MED ORDER — POLYETHYLENE GLYCOL 3350 17 G PO PACK: 17.0000 g | PACK | Freq: Every day | ORAL | Status: DC | PRN

## 2023-11-22 MED ORDER — FENTANYL CITRATE PF 50 MCG/ML IJ SOSY: 25.0000 ug | PREFILLED_SYRINGE | INTRAMUSCULAR | Status: DC | PRN

## 2023-11-22 MED ORDER — ACETAMINOPHEN 650 MG RE SUPP: 650.0000 mg | Freq: Four times a day (QID) | RECTAL | Status: DC | PRN

## 2023-11-22 MED ORDER — SODIUM CHLORIDE 0.9 % IV SOLN: 1.0000 g | INTRAVENOUS | Status: DC

## 2023-11-22 MED ORDER — ACETAMINOPHEN 325 MG PO TABS
650.0000 mg | ORAL_TABLET | Freq: Four times a day (QID) | ORAL | Status: DC | PRN
  Administered 2023-11-22: 650 mg via ORAL
  Filled 2023-11-22: qty 2

## 2023-11-22 MED ORDER — KETOROLAC TROMETHAMINE 15 MG/ML IJ SOLN: 15.0000 mg | Freq: Four times a day (QID) | INTRAMUSCULAR | Status: DC | PRN

## 2023-11-22 MED ORDER — OXYCODONE HCL 5 MG PO TABS: 5.0000 mg | ORAL_TABLET | ORAL | Status: DC | PRN

## 2023-11-22 MED ORDER — NALOXONE HCL 0.4 MG/ML IJ SOLN: 0.4000 mg | INTRAMUSCULAR | Status: DC | PRN

## 2023-11-22 MED ORDER — LACTATED RINGERS IV BOLUS: 1000.0000 mL | Freq: Once | INTRAVENOUS | Status: AC

## 2023-11-22 MED ORDER — HYDROMORPHONE HCL 1 MG/ML IJ SOLN: 0.5000 mg | INTRAMUSCULAR | Status: DC | PRN

## 2023-11-22 MED ORDER — ONDANSETRON HCL 4 MG/2ML IJ SOLN: 4.0000 mg | Freq: Four times a day (QID) | INTRAMUSCULAR | Status: DC | PRN

## 2023-11-22 MED ORDER — TAMSULOSIN HCL 0.4 MG PO CAPS: 0.4000 mg | ORAL_CAPSULE | Freq: Every day | ORAL | 0 refills | Status: AC

## 2023-11-22 MED ORDER — CIPROFLOXACIN HCL 500 MG PO TABS: 500.0000 mg | ORAL_TABLET | Freq: Two times a day (BID) | ORAL | 0 refills | Status: AC

## 2023-11-22 NOTE — Progress Notes (Signed)
  Carryover admission to the Day Admitter.  I discussed this case with the EDP, Sherel Dikes, PA.  Per these discussions:   This is a 33 year old male who is being admitted with obstructing infected left-sided kidney stone complicated by severe sepsis and acute kidney injury, after presenting with worsening left flank pain over the last day.  Urinalysis shows 11-20 white blood cells, and CBC is notable for white blood cell count of 14,900.  Additionally, presenting creatinine is 1.2 relative to his reported baseline creatinine of around 0.7.  CT renal stone shows mild to moderate left-sided hydronephrosis with renal and perirenal edema, along with a 6 x 3 x 3 mm stone in the distal left ureter.  EDP d/w on-call urology, Dr. Cherylene Corrente, who will formally consult and will see the patient in the morning, at which time he may take the patient for left ureteral stent placement; Dr. Cherylene Corrente requests that the patient be kept n.p.o., that he receive Rocephin, and conveys that he is amenable to the patient staying at  [facility]  Rocephin started in the ED.  I have placed an order for observation to pcu for further evaluation management of the above.  I have placed some additional preliminary admit orders via the adult multi-morbid admission order set. I have also ordered continuous lactated Ringer 's at 150 cc/h x 12 hours, stat lactic acid level every 3 hours x 2 occurrences, stat collection of blood cultures x 2, and have continued the Rocephin that was initiated in the ED this evening.  I have made the patient n.p.o., ordered prn IV fentanyl , prn IV Zofran , and added on a urine culture.  Also ordered preoperative EKG, INR.  I have held off ordering Toradol  for now in the setting of reported acute kidney injury.  I have also ordered morning labs in the form of CMP, CBC, magnesium  level.    Camelia Cavalier, DO Hospitalist

## 2023-11-22 NOTE — H&P (Signed)
 History and Physical    Skyy Mcknight WUJ:[id number] DOB: [birth year] DOA: 11/21/2023  PCP: Pcp, No   Patient coming from: Home   Chief Complaint: Left flank pain   HPI: Ramey Schiff is a 33 y.o. male with medical history significant for nephrolithiasis who presents with left flank pain.   He reports developing severe pain in the left flank and hematuria yesterday evening that is similar to what he experienced previously with kidney stones.     ED Course: Upon arrival to the ED, patient is found to be afebrile and saturating well on room air with normal HR and stable BP.  Labs are most notable for creatinine 1.14, WBC 14,900, and normal lactic acid.  CT reveals mild to moderate left hydroureteronephrosis with increased renal and perirenal edema, and a 6 x 3 x 3 mm stone at the UVJ.  Urology was consulted by the ED PA, blood cultures are collected, urine culture was ordered, and the patient was given Rocephin, Toradol , and Zofran .  Review of Systems:  All other systems reviewed and apart from HPI, are negative.  Past Medical History:  Diagnosis Date   History of kidney stones    Kidney stones     Past Surgical History:  Procedure Laterality Date   EXTERNAL FIXATION LEG Left 04/29/2018   Procedure: EXTERNAL FIXATION LEG;  Surgeon: Reynaldo Rossman Ford, MD;  Location: [facility] OR;  Service: Orthopedics;  Laterality: Left;   INGUINAL HERNIA REPAIR Right 03/02/2022   Procedure: HERNIA REPAIR INGUINAL INCARCERATED;  Surgeon: Enid Harry, MD;  Location: [facility] ORS;  Service: General;  Laterality: Right;   ORIF ANKLE FRACTURE Left 04/29/2018   Procedure: OPEN REDUCTION INTERNAL FIXATION (ORIF) LEFT NAVICULAR AND EXTERNAL FIXATION;  Surgeon: Emilianna Barlowe Ford, MD;  Location: [facility] OR;  Service: Orthopedics;  Laterality: Left;   ORIF ANKLE FRACTURE Right 11/18/2023   Procedure: OPEN REDUCTION INTERNAL FIXATION (ORIF) ANKLE FRACTURE;  Surgeon: Donnamarie Gables, MD;  Location: [facility] ORS;  Service:  Orthopedics;  Laterality: Right;   SYNDESMOSIS REPAIR Right 11/18/2023   Procedure: REPAIR, SYNDESMOSIS, ANKLE;  Surgeon: Donnamarie Gables, MD;  Location: [facility] ORS;  Service: Orthopedics;  Laterality: Right;    Social History:   reports that he has been smoking cigarettes. He started smoking about 5 months ago. He has a 0.1 pack-year smoking history. He has never used smokeless tobacco. He reports current alcohol  use. He reports current drug use. Drug: Marijuana.  Not on File  Family History  Problem Relation Age of Onset   Diabetes Mother      Prior to Admission medications   Medication Sig Start Date End Date Taking? Authorizing Provider  ibuprofen  (ADVIL ) 200 MG tablet Take 200 mg by mouth every 6 (six) hours as needed for mild pain (pain score 1-3).   Yes [provider]  oxyCODONE  (ROXICODONE ) 5 MG immediate release tablet Take 1 tablet (5 mg total) by mouth every 4 (four) hours as needed. 11/18/23  Yes [place], Jesse J, PA-C  famotidine  (PEPCID ) 20 MG tablet Take 1 tablet (20 mg total) by mouth 2 (two) times daily. 05/30/19 03/12/20  Ballard Bongo, MD    Physical Exam: Vitals:   11/22/23 0130 11/22/23 0151 11/22/23 0215 11/22/23 0543  BP: 104/64  103/60 100/60  Pulse: (!) 59  65 60  Resp: 18  16 20   Temp:  98.6 F (37 C) 98.3 F (36.8 C) 98.5 F (36.9 C)  TempSrc:  Oral Oral Oral  SpO2: 99%  99% 98%  Weight:   58.6 kg   Height:   5' 9 (1.753 m)     Constitutional: NAD, no pallor or diaphoresis   Eyes: PERTLA, lids and conjunctivae normal ENMT: Mucous membranes are moist. Posterior pharynx clear of any exudate or lesions.   Neck: supple, no masses  Respiratory: no wheezing, no crackles. No accessory muscle use.  Cardiovascular: S1 & S2 heard, regular rate and rhythm. No JVD. Abdomen: No distension, no tenderness, soft. Bowel sounds active.  Skin: no significant rashes, lesions, ulcers. Warm, dry, well-perfused. Neurologic: CN 2-12 grossly intact.  Moving all extremities. Alert and oriented.  Psychiatric: Calm. Cooperative.    Labs and Imaging on Admission: I have personally reviewed following labs and imaging studies  CBC: Recent Labs  Lab 11/21/23 2141 11/22/23 0159  WBC 14.9* 9.8  NEUTROABS 11.4* 6.3  HGB 13.1 12.4*  HCT 37.7* 36.8*  MCV 95.7 95.8  PLT 315 311   Basic Metabolic Panel: Recent Labs  Lab 11/21/23 2141 11/22/23 0159  NA 135 135  K 3.7 3.8  CL 98 97*  CO2 25 25  GLUCOSE 112* 108*  BUN 17 18  CREATININE 1.20 1.14  CALCIUM 9.6 9.2  MG  --  2.5*   GFR: Estimated Creatinine Clearance: 75.7 mL/min (by C-G formula based on SCr of 1.14 mg/dL). Liver Function Tests: Recent Labs  Lab 11/21/23 2141 11/22/23 0159  AST 23 21  ALT 19 16  ALKPHOS 40 45  BILITOT 0.9 0.5  PROT 7.3 6.6  ALBUMIN 3.7 3.4*   No results for input(s): LIPASE, AMYLASE in the last 168 hours. No results for input(s): AMMONIA in the last 168 hours. Coagulation Profile: Recent Labs  Lab 11/22/23 0159  INR 1.0   Cardiac Enzymes: No results for input(s): CKTOTAL, CKMB, CKMBINDEX, TROPONINI in the last 168 hours. BNP (last 3 results) No results for input(s): PROBNP in the last 8760 hours. HbA1C: No results for input(s): HGBA1C in the last 72 hours. CBG: No results for input(s): GLUCAP in the last 168 hours. Lipid Profile: No results for input(s): CHOL, HDL, LDLCALC, TRIG, CHOLHDL, LDLDIRECT in the last 72 hours. Thyroid Function Tests: No results for input(s): TSH, T4TOTAL, FREET4, T3FREE, THYROIDAB in the last 72 hours. Anemia Panel: No results for input(s): VITAMINB12, FOLATE, FERRITIN, TIBC, IRON, RETICCTPCT in the last 72 hours. Urine analysis:    Component Value Date/Time   COLORURINE AMBER (A) 11/21/2023 2238   APPEARANCEUR HAZY (A) 11/21/2023 2238   LABSPEC 1.023 11/21/2023 2238   PHURINE 5.0 11/21/2023 2238   GLUCOSEU NEGATIVE 11/21/2023 2238   HGBUR  LARGE (A) 11/21/2023 2238   BILIRUBINUR NEGATIVE 11/21/2023 2238   KETONESUR NEGATIVE 11/21/2023 2238   PROTEINUR 30 (A) 11/21/2023 2238   NITRITE NEGATIVE 11/21/2023 2238   LEUKOCYTESUR TRACE (A) 11/21/2023 2238   Sepsis Labs: @LABRCNTIP (procalcitonin:4,lacticidven:4) ) Recent Results (from the past 240 hours)  Surgical pcr screen     Status: None   Collection Time: 11/17/23  9:26 AM   Specimen: Nasal Mucosa; Nasal Swab  Result Value Ref Range Status   MRSA, PCR NEGATIVE NEGATIVE Final   Staphylococcus aureus NEGATIVE NEGATIVE Final    Comment: (NOTE) The Xpert SA Assay (FDA approved for NASAL specimens in patients 63 years of age and older), is one component of a comprehensive surveillance program. It is not intended to diagnose infection nor to guide or monitor treatment. Performed at [facility], [address], [place], [place] [address]  Radiological Exams on Admission: CT Renal Stone Study Result Date: 11/21/2023 CLINICAL DATA:  Flank pain.  Stone suspected. EXAM: CT ABDOMEN AND PELVIS WITHOUT CONTRAST TECHNIQUE: Multidetector CT imaging of the abdomen and pelvis was performed following the standard protocol without IV contrast. RADIATION DOSE REDUCTION: This exam was performed according to the departmental dose-optimization program which includes automated exposure control, adjustment of the mA and/or kV according to patient size and/or use of iterative reconstruction technique. COMPARISON:  CT with IV contrast 11/09/2023, CT without contrast 10/01/2023. FINDINGS: Lower chest: No abnormality. Hepatobiliary: No focal liver abnormality is seen. No gallstones, gallbladder wall thickening, or biliary dilatation. Pancreas: Unremarkable without contrast. Spleen: Unremarkable without contrast.  No splenomegaly. Adrenals/Urinary Tract: There is no adrenal mass. Right adrenal gland is calcified as before. There is no contour deforming mass of either of the  unenhanced kidneys. On the left, there is increased renal and perirenal edema and mild-to-moderate left hydroureteronephrosis. Twelve days ago there was a rod-like 6 x 3 x 3 mm stone in the distal ureter 1.5 cm from the UVJ which has moved down to the UVJ. There is a punctate ureteral stone 2 cm above this. Both ureters are otherwise clear. No intrarenal stones are seen bilaterally. Bladder is contracted but grossly normal. Stomach/Bowel: No dilatation or wall thickening including the appendix. Vascular/Lymphatic: No significant vascular findings are present. No enlarged abdominal or pelvic lymph nodes. Reproductive: Prostate is unremarkable apart from dystrophic calcifications centrally. Other: No free fluid, free hemorrhage or free air, or incarcerated hernia. Musculoskeletal: No acute or significant osseous findings. IMPRESSION: 1. Mild-to-moderate left hydroureteronephrosis with increased renal and perirenal edema. Correlate clinically for infectious complication. 2. Twelve days ago there was a 6 x 3 x 3 mm rod-like stone in the distal ureter 1.5 cm from the UVJ which has moved down to the UVJ since then. 3. Punctate left ureteral stone 2 cm above this. 4. No intrarenal stones bilaterally at this time. 5. No other acute noncontrast CT findings. Electronically Signed   By: Denman Fischer M.D.   On: 11/21/2023 23:27    EKG: Independently reviewed. Sinus rhythm.   Assessment/Plan  1. Obstructing left ureteral stone  - Urology consulted by ED PA, blood cultures collected, urine culture ordered, and Rocephin given in ED  - Continue NPO, continue Rocephin, continue pain-control, supportive care   2. Right ankle fx s/p ORIF  - Trimalleolar ankle fracture s/p ORIF on 6/10  - Non-weightbearing, follow-up with ortho as planned    DVT prophylaxis: SCDs  Code Status: Full  Level of Care: Level of care: Progressive Family Communication: None present   Disposition Plan:  Patient is from: home   Anticipated d/c is to: home  Anticipated d/c date is: 11/23/23  Patient currently: Pending urology consultation, treatment of obstructing UVJ stone  Consults called: Urology  Admission status: Observation   Walton Guppy, MD [facility]  11/22/2023, 5:53 AM

## 2023-11-22 NOTE — Progress Notes (Signed)
 I was contacted by the ED evening 11/21/2023 for this patient with left renal colic with CT showing increased perinephric stranding.  He had leukocytosis but was afebrile.  Urinalysis with 11-20 WBC/>50 RBC.  He was admitted to the hospitalist service for pain control and empiric antibiotic with urology consult plan 6/14.  I was contacted by the hospitalist this morning stating that patient's pain had resolved and white count was now normal.  He requested discharge with outpatient follow-up.  CT showed a 3 x 6 mm stone at the UVJ which had progressed from a CT earlier this month. Patient will be contacted for outpatient follow-up.

## 2023-11-22 NOTE — ED Notes (Signed)
 CCMD called.

## 2023-11-23 LAB — CULTURE, OB URINE: Culture: NO GROWTH
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
## 2023-11-26 ENCOUNTER — Encounter (HOSPITAL_COMMUNITY): Payer: Self-pay

## 2023-11-26 ENCOUNTER — Telehealth (INDEPENDENT_AMBULATORY_CARE_PROVIDER_SITE_OTHER): Payer: Self-pay | Admitting: Primary Care

## 2023-11-26 ENCOUNTER — Other Ambulatory Visit: Payer: Self-pay

## 2023-11-26 ENCOUNTER — Emergency Department (HOSPITAL_COMMUNITY)
Admission: EM | Admit: 2023-11-26 | Discharge: 2023-11-27 | Discharge status: Against Medical Advice | Attending: Emergency Medicine | Admitting: Emergency Medicine

## 2023-11-26 DIAGNOSIS — R300 Dysuria: Secondary | ICD-10-CM | POA: Insufficient documentation

## 2023-11-26 DIAGNOSIS — Z5321 Procedure and treatment not carried out due to patient leaving prior to being seen by health care provider: Secondary | ICD-10-CM | POA: Insufficient documentation

## 2023-11-26 NOTE — Telephone Encounter (Signed)
Pt's phone is unavailable.

## 2023-11-26 NOTE — ED Triage Notes (Signed)
 Pt reports there is a bump under his testicles that he noticed yesterday as well as dysuria.

## 2023-11-27 ENCOUNTER — Ambulatory Visit (INDEPENDENT_AMBULATORY_CARE_PROVIDER_SITE_OTHER): Payer: Self-pay | Admitting: Primary Care

## 2023-11-27 ENCOUNTER — Encounter (INDEPENDENT_AMBULATORY_CARE_PROVIDER_SITE_OTHER): Payer: Self-pay | Admitting: Primary Care

## 2023-11-27 VITALS — BP 118/77 | HR 86 | Resp 16 | Ht 69.0 in | Wt 145.0 lb

## 2023-11-27 DIAGNOSIS — N200 Calculus of kidney: Secondary | ICD-10-CM

## 2023-11-27 DIAGNOSIS — Z7689 Persons encountering health services in other specified circumstances: Secondary | ICD-10-CM

## 2023-11-27 DIAGNOSIS — Z2821 Immunization not carried out because of patient refusal: Secondary | ICD-10-CM

## 2023-11-27 DIAGNOSIS — R7309 Other abnormal glucose: Secondary | ICD-10-CM | POA: Diagnosis not present

## 2023-11-27 DIAGNOSIS — Z28310 Unvaccinated for covid-19: Secondary | ICD-10-CM

## 2023-11-27 LAB — POCT GLYCOSYLATED HEMOGLOBIN (HGB A1C): Hemoglobin A1C: 5.4 % (ref 4.0–5.6)

## 2023-11-27 LAB — CULTURE, BLOOD (ROUTINE X 2)
Culture: NO GROWTH
Culture: NO GROWTH
Special Requests: ADEQUATE
Special Requests: ADEQUATE
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
## 2023-11-28 NOTE — ED Notes (Signed)
 Lab called to report no urine in lab to run gc chlamydia and it appears patient LWBS

## 2023-12-22 ENCOUNTER — Encounter (HOSPITAL_COMMUNITY): Payer: Self-pay | Admitting: Orthopaedic Surgery

## 2023-12-22 NOTE — Addendum Note (Signed)
 Addendum  created 12/22/23 0841 by Jefm Garnette LABOR, MD   Intraprocedure Event edited, Intraprocedure Staff edited

## 2024-01-26 ENCOUNTER — Encounter: Payer: Self-pay | Admitting: Physical Therapy

## 2024-01-26 ENCOUNTER — Ambulatory Visit: Attending: Orthopaedic Surgery | Admitting: Physical Therapy

## 2024-01-26 ENCOUNTER — Other Ambulatory Visit: Payer: Self-pay

## 2024-01-26 DIAGNOSIS — M6281 Muscle weakness (generalized): Secondary | ICD-10-CM | POA: Insufficient documentation

## 2024-01-26 DIAGNOSIS — M25571 Pain in right ankle and joints of right foot: Secondary | ICD-10-CM | POA: Insufficient documentation

## 2024-01-26 DIAGNOSIS — R262 Difficulty in walking, not elsewhere classified: Secondary | ICD-10-CM | POA: Insufficient documentation

## 2024-01-26 DIAGNOSIS — M25671 Stiffness of right ankle, not elsewhere classified: Secondary | ICD-10-CM | POA: Diagnosis present
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
## 2024-02-02 ENCOUNTER — Ambulatory Visit: Admitting: Physical Therapy

## 2024-02-02 ENCOUNTER — Encounter: Payer: Self-pay | Admitting: Physical Therapy

## 2024-02-02 DIAGNOSIS — M25671 Stiffness of right ankle, not elsewhere classified: Secondary | ICD-10-CM

## 2024-02-02 DIAGNOSIS — R262 Difficulty in walking, not elsewhere classified: Secondary | ICD-10-CM

## 2024-02-02 DIAGNOSIS — M25571 Pain in right ankle and joints of right foot: Secondary | ICD-10-CM

## 2024-02-02 DIAGNOSIS — M6281 Muscle weakness (generalized): Secondary | ICD-10-CM
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
## 2024-02-04 ENCOUNTER — Ambulatory Visit: Admitting: Physical Therapy

## 2024-02-04 ENCOUNTER — Encounter: Payer: Self-pay | Admitting: Physical Therapy

## 2024-02-04 DIAGNOSIS — M25671 Stiffness of right ankle, not elsewhere classified: Secondary | ICD-10-CM | POA: Diagnosis not present

## 2024-02-04 DIAGNOSIS — R262 Difficulty in walking, not elsewhere classified: Secondary | ICD-10-CM

## 2024-02-04 DIAGNOSIS — M25571 Pain in right ankle and joints of right foot: Secondary | ICD-10-CM

## 2024-02-04 DIAGNOSIS — M6281 Muscle weakness (generalized): Secondary | ICD-10-CM
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
## 2024-02-11 ENCOUNTER — Ambulatory Visit: Attending: Orthopaedic Surgery | Admitting: Physical Therapy

## 2024-02-11 ENCOUNTER — Encounter: Payer: Self-pay | Admitting: Physical Therapy

## 2024-02-11 DIAGNOSIS — R262 Difficulty in walking, not elsewhere classified: Secondary | ICD-10-CM | POA: Insufficient documentation

## 2024-02-11 DIAGNOSIS — M6281 Muscle weakness (generalized): Secondary | ICD-10-CM | POA: Insufficient documentation

## 2024-02-11 DIAGNOSIS — M25671 Stiffness of right ankle, not elsewhere classified: Secondary | ICD-10-CM | POA: Diagnosis present

## 2024-02-11 DIAGNOSIS — M25571 Pain in right ankle and joints of right foot: Secondary | ICD-10-CM | POA: Insufficient documentation
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
## 2024-02-12 ENCOUNTER — Ambulatory Visit: Admitting: Physical Therapy

## 2024-02-12 DIAGNOSIS — M25671 Stiffness of right ankle, not elsewhere classified: Secondary | ICD-10-CM

## 2024-02-12 DIAGNOSIS — R262 Difficulty in walking, not elsewhere classified: Secondary | ICD-10-CM

## 2024-02-12 DIAGNOSIS — M6281 Muscle weakness (generalized): Secondary | ICD-10-CM

## 2024-02-12 DIAGNOSIS — M25571 Pain in right ankle and joints of right foot: Secondary | ICD-10-CM
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
## 2024-02-16 ENCOUNTER — Ambulatory Visit: Admitting: Physical Therapy

## 2024-02-16 ENCOUNTER — Encounter: Payer: Self-pay | Admitting: Physical Therapy

## 2024-02-16 DIAGNOSIS — M25671 Stiffness of right ankle, not elsewhere classified: Secondary | ICD-10-CM | POA: Diagnosis not present

## 2024-02-16 DIAGNOSIS — M6281 Muscle weakness (generalized): Secondary | ICD-10-CM

## 2024-02-16 DIAGNOSIS — M25571 Pain in right ankle and joints of right foot: Secondary | ICD-10-CM

## 2024-02-16 DIAGNOSIS — R262 Difficulty in walking, not elsewhere classified: Secondary | ICD-10-CM
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
## 2024-02-18 ENCOUNTER — Ambulatory Visit: Admitting: Physical Therapy

## 2024-02-18 ENCOUNTER — Encounter: Payer: Self-pay | Admitting: Physical Therapy

## 2024-02-18 DIAGNOSIS — R262 Difficulty in walking, not elsewhere classified: Secondary | ICD-10-CM

## 2024-02-18 DIAGNOSIS — M25671 Stiffness of right ankle, not elsewhere classified: Secondary | ICD-10-CM | POA: Diagnosis not present

## 2024-02-18 DIAGNOSIS — M6281 Muscle weakness (generalized): Secondary | ICD-10-CM

## 2024-02-18 DIAGNOSIS — M25571 Pain in right ankle and joints of right foot: Secondary | ICD-10-CM
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
## 2024-02-23 ENCOUNTER — Encounter: Payer: Self-pay | Admitting: Physical Therapy

## 2024-02-23 ENCOUNTER — Ambulatory Visit: Admitting: Physical Therapy

## 2024-02-23 DIAGNOSIS — M6281 Muscle weakness (generalized): Secondary | ICD-10-CM

## 2024-02-23 DIAGNOSIS — R262 Difficulty in walking, not elsewhere classified: Secondary | ICD-10-CM

## 2024-02-23 DIAGNOSIS — M25671 Stiffness of right ankle, not elsewhere classified: Secondary | ICD-10-CM

## 2024-02-23 DIAGNOSIS — M25571 Pain in right ankle and joints of right foot: Secondary | ICD-10-CM
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
## 2024-02-25 ENCOUNTER — Ambulatory Visit

## 2024-03-30 ENCOUNTER — Emergency Department (HOSPITAL_COMMUNITY)
Admission: EM | Admit: 2024-03-30 | Discharge: 2024-03-31 | Disposition: A | Discharge status: Home / Self Care | Attending: Emergency Medicine | Admitting: Emergency Medicine

## 2024-03-30 ENCOUNTER — Other Ambulatory Visit: Payer: Self-pay

## 2024-03-30 DIAGNOSIS — R109 Unspecified abdominal pain: Secondary | ICD-10-CM | POA: Diagnosis not present

## 2024-03-30 DIAGNOSIS — R112 Nausea with vomiting, unspecified: Secondary | ICD-10-CM | POA: Diagnosis present

## 2024-03-30 MED ORDER — ONDANSETRON 4 MG PO TBDP
4.0000 mg | ORAL_TABLET | Freq: Once | ORAL | Status: AC
  Administered 2024-03-31: 4 mg via ORAL
  Filled 2024-03-30: qty 1

## 2024-03-30 NOTE — ED Provider Notes (Signed)
 Aaron Fry Provider Note   CSN: [id number] Arrival date & time: 03/30/24  2325     Patient presents with: Abdominal Pain   Aaron Fry is a 33 y.o. male presents today for generalized abdominal pain with 1 episode of nausea and vomiting after eating a sandwich tonight.  Patient denies fever, chills, diarrhea, dysuria, chest pain, shortness of breath, or any other symptoms at this time.  Patient reports all of his symptoms have resolved after ODT Zofran .    Abdominal Pain Associated symptoms: nausea and vomiting        Prior to Admission medications   Medication Sig Start Date End Date Taking? Authorizing Provider  dicyclomine  (BENTYL ) 20 MG tablet Take 1 tablet (20 mg total) by mouth 2 (two) times daily. 03/31/24  Yes Mahmoud Blazejewski N, PA-C  ondansetron  (ZOFRAN -ODT) 4 MG disintegrating tablet Take 1 tablet (4 mg total) by mouth every 8 (eight) hours as needed for nausea or vomiting. 03/31/24  Yes Conner Muegge N, PA-C  ibuprofen  (ADVIL ) 200 MG tablet Take 200 mg by mouth every 6 (six) hours as needed for mild pain (pain score 1-3). Patient not taking: Reported on 11/27/2023    [provider]  oxyCODONE  (ROXICODONE ) 5 MG immediate release tablet Take 1 tablet (5 mg total) by mouth every 4 (four) hours as needed. 11/18/23   [place], Jesse J, PA-C  tamsulosin  (FLOMAX ) 0.4 MG CAPS capsule Take 1 capsule (0.4 mg total) by mouth daily. 11/23/23   Tobie Yetta HERO, MD  famotidine  (PEPCID ) 20 MG tablet Take 1 tablet (20 mg total) by mouth 2 (two) times daily. 05/30/19 03/12/20  Haze Lonni PARAS, MD    Allergies: Patient has no known allergies.    Review of Systems  Gastrointestinal:  Positive for abdominal pain, nausea and vomiting.    Updated Vital Signs BP 119/74 (BP Location: Right Arm)   Pulse 73   Temp 97.9 F (36.6 C) (Oral)   Resp 19   Ht 5' 9 (1.753 m)   Wt 63.5 kg   SpO2 98%   BMI 20.67 kg/m   Physical  Exam Vitals and nursing note reviewed.  Constitutional:      General: He is not in acute distress.    Appearance: He is well-developed. He is not ill-appearing.  HENT:     Head: Normocephalic and atraumatic.  Eyes:     Extraocular Movements: Extraocular movements intact.     Conjunctiva/sclera: Conjunctivae normal.  Cardiovascular:     Rate and Rhythm: Normal rate and regular rhythm.     Heart sounds: Normal heart sounds. No murmur heard. Pulmonary:     Effort: Pulmonary effort is normal. No respiratory distress.     Breath sounds: Normal breath sounds.  Abdominal:     General: Bowel sounds are normal. There is no distension.     Palpations: Abdomen is soft.     Tenderness: There is no abdominal tenderness. There is no right CVA tenderness, guarding or rebound. Negative signs include Murphy's sign, Rovsing's sign and McBurney's sign.  Musculoskeletal:        General: No swelling.     Cervical back: Neck supple.  Skin:    General: Skin is warm and dry.     Capillary Refill: Capillary refill takes less than 2 seconds.  Neurological:     General: No focal deficit present.     Mental Status: He is alert and oriented to person, place, and time.  Psychiatric:  Mood and Affect: Mood normal.     (all labs ordered are listed, but only abnormal results are displayed) Labs Reviewed  COMPREHENSIVE METABOLIC PANEL WITH GFR - Abnormal; Notable for the following components:      Result Value   Glucose, Bld 103 (*)    All other components within normal limits  URINALYSIS, ROUTINE W REFLEX MICROSCOPIC - Abnormal; Notable for the following components:   Color, Urine STRAW (*)    All other components within normal limits  CBC WITH DIFFERENTIAL/PLATELET    EKG: None  Radiology: No results found.   Procedures   Medications Ordered in the ED  ondansetron  (ZOFRAN -ODT) disintegrating tablet 4 mg (4 mg Oral Given 03/31/24 0040)                                    Medical  Decision Making Amount and/or Complexity of Data Reviewed Labs: ordered.   This patient presents to the ED for concern of abdominal pain with nausea and vomiting differential diagnosis includes viral GI illness, choledocholithiasis, acute cholecystitis, diverticulitis, Crohn's disease, SBO, appendicitis   Lab Tests:  I Ordered, and personally interpreted labs.  The pertinent results include:  CMP WNL, CBC WNL, UA unremarkable   Medicines ordered and prescription drug management:  I ordered medication including Zofran     I have reviewed the patients home medicines and have made adjustments as needed   Problem List / ED Course:  Patient states all of his symptoms have resolved at this time, patient tolerating p.o. intake without issue prior to discharge Considered for admission or further workup however patient's vital signs, physical exam, and labs are reassuring.  Patient's symptoms likely due to viral GI illness or foodborne illness.  Patient given outpatient Zofran  and Bentyl  for nausea and vomiting as well as abdominal pain.  Patient given return precautions.  I feel patient safe for discharge at this time.     Final diagnoses:  Abdominal pain, unspecified abdominal location  Nausea and vomiting, unspecified vomiting type    ED Discharge Orders          Ordered    ondansetron  (ZOFRAN -ODT) 4 MG disintegrating tablet  Every 8 hours PRN        03/31/24 0108    dicyclomine  (BENTYL ) 20 MG tablet  2 times daily        03/31/24 0108               Francis Ileana SAILOR, PA-C 03/31/24 0113    Palumbo, April, MD 03/31/24 [phone]

## 2024-03-30 NOTE — ED Triage Notes (Signed)
 Patient c/o generalized abdominal pain after eating sandwich tonight. Patient report nausea and vomiting x 1. Patient denies diarrhea. Patient denies dysuria.

## 2024-03-30 NOTE — ED Provider Notes (Incomplete)
 [place] EMERGENCY DEPARTMENT AT [facility] Provider Note   CSN: [id number] Arrival date & time: 03/30/24  2325     Patient presents with: Abdominal Pain   Aaron Fry is a 33 y.o. male presents today for generalized abdominal pain with 1 episode of nausea and vomiting after eating a sandwich tonight.  Patient denies fever, chills, diarrhea, dysuria, chest pain, shortness of breath, or any other symptoms at this time.  {Add pertinent medical, surgical, social history, OB history to HPI:32947}  Abdominal Pain Associated symptoms: nausea and vomiting        Prior to Admission medications   Medication Sig Start Date End Date Taking? Authorizing Provider  ibuprofen  (ADVIL ) 200 MG tablet Take 200 mg by mouth every 6 (six) hours as needed for mild pain (pain score 1-3). Patient not taking: Reported on 11/27/2023    [provider]  oxyCODONE  (ROXICODONE ) 5 MG immediate release tablet Take 1 tablet (5 mg total) by mouth every 4 (four) hours as needed. 11/18/23   [place], Jesse J, PA-C  tamsulosin  (FLOMAX ) 0.4 MG CAPS capsule Take 1 capsule (0.4 mg total) by mouth daily. 11/23/23   Tobie Yetta HERO, MD  famotidine  (PEPCID ) 20 MG tablet Take 1 tablet (20 mg total) by mouth 2 (two) times daily. 05/30/19 03/12/20  Haze Lonni PARAS, MD    Allergies: Patient has no known allergies.    Review of Systems  Gastrointestinal:  Positive for abdominal pain, nausea and vomiting.    Updated Vital Signs BP 119/74 (BP Location: Right Arm)   Pulse 73   Temp 97.9 F (36.6 C) (Oral)   Resp 19   Ht 5' 9 (1.753 m)   Wt 63.5 kg   SpO2 98%   BMI 20.67 kg/m   Physical Exam  (all labs ordered are listed, but only abnormal results are displayed) Labs Reviewed  COMPREHENSIVE METABOLIC PANEL WITH GFR  URINALYSIS, ROUTINE W REFLEX MICROSCOPIC  CBC WITH DIFFERENTIAL/PLATELET    EKG: None  Radiology: No results found.  {Document cardiac monitor, telemetry assessment  procedure when appropriate:32947} Procedures   Medications Ordered in the ED - No data to display    {Click here for ABCD2, HEART and other calculators REFRESH Note before signing:1}                              Medical Decision Making Amount and/or Complexity of Data Reviewed Labs: ordered.   This patient presents to the ED for concern of abdominal pain with nausea and vomiting differential diagnosis includes viral GI illness, choledocholithiasis, acute cholecystitis, diverticulitis, Crohn's disease, SBO, appendicitis   Lab Tests:  I Ordered, and personally interpreted labs.  The pertinent results include:  ***   Imaging Studies ordered:  I ordered imaging studies including ***  I independently visualized and interpreted imaging which showed *** I agree with the radiologist interpretation   Medicines ordered and prescription drug management:  I ordered medication including Zofran     I have reviewed the patients home medicines and have made adjustments as needed   Problem List / ED Course:  ***  {Document critical care time when appropriate  Document review of labs and clinical decision tools ie CHADS2VASC2, etc  Document your independent review of radiology images and any outside records  Document your discussion with family members, caretakers and with consultants  Document social determinants of health affecting pt's care  Document your decision making why or why  not admission, treatments were needed:32947:::1}   Final diagnoses:  None    ED Discharge Orders     None

## 2024-03-31 LAB — CBC WITH DIFFERENTIAL/PLATELET
Abs Immature Granulocytes: 0.01 K/uL (ref 0.00–0.07)
Basophils Absolute: 0.1 K/uL (ref 0.0–0.1)
Basophils Relative: 1 %
Eosinophils Absolute: 0.3 K/uL (ref 0.0–0.5)
Eosinophils Relative: 4 %
HCT: 44 % (ref 39.0–52.0)
Hemoglobin: 14.1 g/dL (ref 13.0–17.0)
Immature Granulocytes: 0 %
Lymphocytes Relative: 48 %
Lymphs Abs: 3.4 K/uL (ref 0.7–4.0)
MCH: 31.3 pg (ref 26.0–34.0)
MCHC: 32 g/dL (ref 30.0–36.0)
MCV: 97.8 fL (ref 80.0–100.0)
Monocytes Absolute: 0.6 K/uL (ref 0.1–1.0)
Monocytes Relative: 8 %
Neutro Abs: 2.8 K/uL (ref 1.7–7.7)
Neutrophils Relative %: 39 %
Platelets: 230 K/uL (ref 150–400)
RBC: 4.5 MIL/uL (ref 4.22–5.81)
RDW: 12.6 % (ref 11.5–15.5)
WBC: 7.1 K/uL (ref 4.0–10.5)
nRBC: 0 % (ref 0.0–0.2)

## 2024-03-31 LAB — COMPREHENSIVE METABOLIC PANEL WITH GFR
ALT: 7 U/L (ref 0–44)
AST: 15 U/L (ref 15–41)
Albumin: 4.4 g/dL (ref 3.5–5.0)
Alkaline Phosphatase: 69 U/L (ref 38–126)
Anion gap: 11 (ref 5–15)
BUN: 14 mg/dL (ref 6–20)
CO2: 28 mmol/L (ref 22–32)
Calcium: 9.5 mg/dL (ref 8.9–10.3)
Chloride: 100 mmol/L (ref 98–111)
Creatinine, Ser: 0.81 mg/dL (ref 0.61–1.24)
GFR, Estimated: >60 mL/min (ref >60)
Glucose, Bld: 103 mg/dL — ABNORMAL HIGH (ref 70–99)
Potassium: 3.6 mmol/L (ref 3.5–5.1)
Sodium: 138 mmol/L (ref 135–145)
Total Bilirubin: 0.3 mg/dL (ref 0.0–1.2)
Total Protein: 6.9 g/dL (ref 6.5–8.1)

## 2024-03-31 LAB — URINALYSIS, ROUTINE W REFLEX MICROSCOPIC
Bilirubin Urine: NEGATIVE
Glucose, UA: NEGATIVE mg/dL
Hgb urine dipstick: NEGATIVE
Ketones, ur: NEGATIVE mg/dL
Leukocytes,Ua: NEGATIVE
Nitrite: NEGATIVE
Protein, ur: NEGATIVE mg/dL
Specific Gravity, Urine: 1.005 (ref 1.005–1.030)
pH: 6 (ref 5.0–8.0)

## 2024-03-31 MED ORDER — DICYCLOMINE HCL 20 MG PO TABS
20.0000 mg | ORAL_TABLET | Freq: Two times a day (BID) | ORAL | 0 refills | Status: AC
Start: 2024-03-31 — End: ?

## 2024-03-31 MED ORDER — ONDANSETRON 4 MG PO TBDP: 4.0000 mg | ORAL_TABLET | Freq: Three times a day (TID) | ORAL | 0 refills | Status: AC | PRN

## 2024-03-31 NOTE — Discharge Instructions (Addendum)
 Today you were seen for abdominal pain with nausea and vomiting.  I suspect this is likely due to something you ate.  Please pick up your Zofran  for nausea and vomiting and Bentyl  for abdominal pain.  Please return to the ED if you have worsening symptoms, fever that does not go down with Tylenol  or Motrin , or uncontrollable vomiting.  Thank you for letting us  treat you today. After reviewing your labs, I feel you are safe to go home. Please follow up with your PCP in the next several days and provide them with your records from this visit. Return to the Emergency Room if pain becomes severe or symptoms worsen.
